# Patient Record
Sex: Male | Born: 1935 | Race: White | Hispanic: No | Marital: Married | Smoking: Former smoker
Health system: Southern US, Community
[De-identification: ages and names within clinical notes are randomized; demographics above are authoritative.]

## PROBLEM LIST (undated history)

## (undated) DIAGNOSIS — Z789 Other specified health status: Secondary | ICD-10-CM

## (undated) DIAGNOSIS — K4090 Unilateral inguinal hernia, without obstruction or gangrene, not specified as recurrent: Secondary | ICD-10-CM

## (undated) DIAGNOSIS — E559 Vitamin D deficiency, unspecified: Secondary | ICD-10-CM

## (undated) DIAGNOSIS — E785 Hyperlipidemia, unspecified: Secondary | ICD-10-CM

## (undated) DIAGNOSIS — C801 Malignant (primary) neoplasm, unspecified: Secondary | ICD-10-CM

## (undated) DIAGNOSIS — Z973 Presence of spectacles and contact lenses: Secondary | ICD-10-CM

## (undated) DIAGNOSIS — Z974 Presence of external hearing-aid: Secondary | ICD-10-CM

## (undated) DIAGNOSIS — R413 Other amnesia: Secondary | ICD-10-CM

## (undated) DIAGNOSIS — H9190 Unspecified hearing loss, unspecified ear: Secondary | ICD-10-CM

## (undated) DIAGNOSIS — K219 Gastro-esophageal reflux disease without esophagitis: Secondary | ICD-10-CM

## (undated) DIAGNOSIS — I10 Essential (primary) hypertension: Secondary | ICD-10-CM

## (undated) DIAGNOSIS — R7303 Prediabetes: Secondary | ICD-10-CM

## (undated) HISTORY — PX: TONSILLECTOMY: SUR1361

## (undated) HISTORY — DX: Other amnesia: R41.3

## (undated) HISTORY — PX: PROSTATE SURGERY: SHX751

## (undated) HISTORY — DX: Unspecified hearing loss, unspecified ear: H91.90

## (undated) HISTORY — DX: Hyperlipidemia, unspecified: E78.5

## (undated) HISTORY — DX: Gastro-esophageal reflux disease without esophagitis: K21.9

## (undated) HISTORY — DX: Prediabetes: R73.03

## (undated) HISTORY — DX: Unilateral inguinal hernia, without obstruction or gangrene, not specified as recurrent: K40.90

## (undated) HISTORY — DX: Essential (primary) hypertension: I10

## (undated) HISTORY — DX: Malignant (primary) neoplasm, unspecified: C80.1

## (undated) HISTORY — PX: COLONOSCOPY: SHX174

## (undated) HISTORY — DX: Vitamin D deficiency, unspecified: E55.9

---

## 1998-03-26 ENCOUNTER — Ambulatory Visit (HOSPITAL_COMMUNITY): Admission: RE | Admit: 1998-03-26 | Discharge: 1998-03-26 | Payer: Self-pay | Admitting: Internal Medicine

## 1998-09-21 ENCOUNTER — Emergency Department (HOSPITAL_COMMUNITY): Admission: EM | Admit: 1998-09-21 | Discharge: 1998-09-21 | Payer: Self-pay | Admitting: Emergency Medicine

## 1998-09-21 ENCOUNTER — Encounter: Payer: Self-pay | Admitting: Emergency Medicine

## 1999-03-31 ENCOUNTER — Ambulatory Visit (HOSPITAL_COMMUNITY): Admission: RE | Admit: 1999-03-31 | Discharge: 1999-03-31 | Payer: Self-pay | Admitting: Internal Medicine

## 1999-03-31 ENCOUNTER — Encounter: Payer: Self-pay | Admitting: Internal Medicine

## 1999-08-20 ENCOUNTER — Other Ambulatory Visit: Admission: RE | Admit: 1999-08-20 | Discharge: 1999-08-20 | Payer: Self-pay | Admitting: Urology

## 1999-09-16 ENCOUNTER — Encounter: Admission: RE | Admit: 1999-09-16 | Discharge: 1999-12-15 | Payer: Self-pay | Admitting: Radiation Oncology

## 1999-09-28 ENCOUNTER — Inpatient Hospital Stay (HOSPITAL_COMMUNITY): Admission: RE | Admit: 1999-09-28 | Discharge: 1999-10-01 | Payer: Self-pay | Admitting: Urology

## 1999-09-28 ENCOUNTER — Encounter (INDEPENDENT_AMBULATORY_CARE_PROVIDER_SITE_OTHER): Payer: Self-pay

## 2000-04-14 ENCOUNTER — Encounter: Payer: Self-pay | Admitting: Internal Medicine

## 2000-04-14 ENCOUNTER — Ambulatory Visit (HOSPITAL_COMMUNITY): Admission: RE | Admit: 2000-04-14 | Discharge: 2000-04-14 | Payer: Self-pay | Admitting: Internal Medicine

## 2001-04-20 ENCOUNTER — Encounter: Payer: Self-pay | Admitting: Internal Medicine

## 2001-04-20 ENCOUNTER — Ambulatory Visit (HOSPITAL_COMMUNITY): Admission: RE | Admit: 2001-04-20 | Discharge: 2001-04-20 | Payer: Self-pay | Admitting: Internal Medicine

## 2001-08-31 ENCOUNTER — Ambulatory Visit (HOSPITAL_COMMUNITY): Admission: RE | Admit: 2001-08-31 | Discharge: 2001-08-31 | Payer: Self-pay | Admitting: Gastroenterology

## 2002-05-10 ENCOUNTER — Ambulatory Visit (HOSPITAL_COMMUNITY): Admission: RE | Admit: 2002-05-10 | Discharge: 2002-05-10 | Payer: Self-pay | Admitting: Internal Medicine

## 2002-05-10 ENCOUNTER — Encounter: Payer: Self-pay | Admitting: Internal Medicine

## 2003-07-05 ENCOUNTER — Encounter: Payer: Self-pay | Admitting: Internal Medicine

## 2003-07-05 ENCOUNTER — Ambulatory Visit (HOSPITAL_COMMUNITY): Admission: RE | Admit: 2003-07-05 | Discharge: 2003-07-05 | Payer: Self-pay | Admitting: Internal Medicine

## 2004-07-09 ENCOUNTER — Ambulatory Visit (HOSPITAL_COMMUNITY): Admission: RE | Admit: 2004-07-09 | Discharge: 2004-07-09 | Payer: Self-pay | Admitting: Internal Medicine

## 2009-08-13 ENCOUNTER — Ambulatory Visit (HOSPITAL_COMMUNITY): Admission: RE | Admit: 2009-08-13 | Discharge: 2009-08-13 | Payer: Self-pay | Admitting: Internal Medicine

## 2011-05-14 NOTE — Procedures (Signed)
[place]  Patient:    Brian Proctor, Brian Proctor Visit Number: [id number] MRN: [id number]          Service Type: END Location: ENDO Attending Physician:  Orland Mustard Dictated by:   Llana Aliment. Randa Evens, M.D. Proc. Date: 08/31/01 Admit Date:  08/31/2001   CC:         Marinus Maw, M.D.   Procedure Report  PROCEDURE:  Colonoscopy.  MEDICATIONS:  Fentanyl 60 mcg, Versed 6 mg IV.  INDICATION:  Colon cancer screening.  SCOPE:  Olympus adult video colonoscope.  DESCRIPTION OF PROCEDURE:  The procedure had been explained to the patient and consent obtained.  With the patient in the left lateral decubitus position, the Olympus adult video colonoscope was inserted and advanced under direct visualization.  The prep was excellent.  We were able to reach the cecum using abdominal pressure and position change.  The ileocecal valve and appendiceal orifice were seen.  The scope was withdrawn, and the cecum, ascending colon, hepatic flexure, transverse colon, splenic flexure, descending, and sigmoid colon all seen well upon removal.  No polyps or other lesions were seen.  The scope was withdrawn.  The patient tolerated the procedure well, was maintained on low-flow oxygen and pulse oximeter throughout the procedure.  ASSESSMENT:  Normal screening colonoscopy.  PLAN:  Will recommend routine yearly Hemoccults with consideration of another colonoscopy or sigmoidoscopy in five to 10 years. Dictated by:   Llana Aliment. Randa Evens, M.D. Attending Physician:  Orland Mustard DD:  08/31/01 TD:  08/31/01 Job: [id number]

## 2012-01-10 DIAGNOSIS — M545 Low back pain: Secondary | ICD-10-CM | POA: Diagnosis not present

## 2012-02-07 DIAGNOSIS — Z79899 Other long term (current) drug therapy: Secondary | ICD-10-CM | POA: Diagnosis not present

## 2012-02-07 DIAGNOSIS — R7309 Other abnormal glucose: Secondary | ICD-10-CM | POA: Diagnosis not present

## 2012-02-07 DIAGNOSIS — E559 Vitamin D deficiency, unspecified: Secondary | ICD-10-CM | POA: Diagnosis not present

## 2012-02-07 DIAGNOSIS — Z111 Encounter for screening for respiratory tuberculosis: Secondary | ICD-10-CM | POA: Diagnosis not present

## 2012-02-07 DIAGNOSIS — I10 Essential (primary) hypertension: Secondary | ICD-10-CM | POA: Diagnosis not present

## 2012-02-07 DIAGNOSIS — C61 Malignant neoplasm of prostate: Secondary | ICD-10-CM | POA: Diagnosis not present

## 2012-02-07 DIAGNOSIS — E782 Mixed hyperlipidemia: Secondary | ICD-10-CM | POA: Diagnosis not present

## 2012-02-07 DIAGNOSIS — Z1212 Encounter for screening for malignant neoplasm of rectum: Secondary | ICD-10-CM | POA: Diagnosis not present

## 2012-05-08 DIAGNOSIS — Z79899 Other long term (current) drug therapy: Secondary | ICD-10-CM | POA: Diagnosis not present

## 2012-05-08 DIAGNOSIS — I10 Essential (primary) hypertension: Secondary | ICD-10-CM | POA: Diagnosis not present

## 2012-05-08 DIAGNOSIS — E559 Vitamin D deficiency, unspecified: Secondary | ICD-10-CM | POA: Diagnosis not present

## 2012-05-08 DIAGNOSIS — E782 Mixed hyperlipidemia: Secondary | ICD-10-CM | POA: Diagnosis not present

## 2012-05-10 DIAGNOSIS — K648 Other hemorrhoids: Secondary | ICD-10-CM | POA: Diagnosis not present

## 2012-05-10 DIAGNOSIS — K5730 Diverticulosis of large intestine without perforation or abscess without bleeding: Secondary | ICD-10-CM | POA: Diagnosis not present

## 2012-05-10 DIAGNOSIS — Z1211 Encounter for screening for malignant neoplasm of colon: Secondary | ICD-10-CM | POA: Diagnosis not present

## 2012-05-24 DIAGNOSIS — L570 Actinic keratosis: Secondary | ICD-10-CM | POA: Diagnosis not present

## 2012-05-24 DIAGNOSIS — L821 Other seborrheic keratosis: Secondary | ICD-10-CM | POA: Diagnosis not present

## 2012-08-09 DIAGNOSIS — I10 Essential (primary) hypertension: Secondary | ICD-10-CM | POA: Diagnosis not present

## 2012-08-09 DIAGNOSIS — E782 Mixed hyperlipidemia: Secondary | ICD-10-CM | POA: Diagnosis not present

## 2012-08-09 DIAGNOSIS — R7309 Other abnormal glucose: Secondary | ICD-10-CM | POA: Diagnosis not present

## 2012-08-09 DIAGNOSIS — E559 Vitamin D deficiency, unspecified: Secondary | ICD-10-CM | POA: Diagnosis not present

## 2012-08-09 DIAGNOSIS — Z79899 Other long term (current) drug therapy: Secondary | ICD-10-CM | POA: Diagnosis not present

## 2012-10-05 DIAGNOSIS — Z23 Encounter for immunization: Secondary | ICD-10-CM | POA: Diagnosis not present

## 2012-10-23 DIAGNOSIS — R1900 Intra-abdominal and pelvic swelling, mass and lump, unspecified site: Secondary | ICD-10-CM | POA: Diagnosis not present

## 2012-10-23 DIAGNOSIS — K4090 Unilateral inguinal hernia, without obstruction or gangrene, not specified as recurrent: Secondary | ICD-10-CM | POA: Diagnosis not present

## 2012-10-31 ENCOUNTER — Other Ambulatory Visit: Payer: Self-pay | Admitting: Internal Medicine

## 2012-10-31 DIAGNOSIS — R1031 Right lower quadrant pain: Secondary | ICD-10-CM

## 2012-11-01 ENCOUNTER — Ambulatory Visit
Admission: RE | Admit: 2012-11-01 | Discharge: 2012-11-01 | Disposition: A | Discharge status: Home / Self Care | Payer: Medicare Other | Source: Ambulatory Visit | Attending: Internal Medicine | Admitting: Internal Medicine

## 2012-11-01 DIAGNOSIS — R1031 Right lower quadrant pain: Secondary | ICD-10-CM

## 2012-11-01 DIAGNOSIS — R109 Unspecified abdominal pain: Secondary | ICD-10-CM | POA: Diagnosis not present

## 2012-11-27 DIAGNOSIS — L570 Actinic keratosis: Secondary | ICD-10-CM | POA: Diagnosis not present

## 2012-11-27 DIAGNOSIS — L821 Other seborrheic keratosis: Secondary | ICD-10-CM | POA: Diagnosis not present

## 2012-11-27 DIAGNOSIS — C44319 Basal cell carcinoma of skin of other parts of face: Secondary | ICD-10-CM | POA: Diagnosis not present

## 2012-11-27 DIAGNOSIS — D485 Neoplasm of uncertain behavior of skin: Secondary | ICD-10-CM | POA: Diagnosis not present

## 2012-12-05 DIAGNOSIS — H2510 Age-related nuclear cataract, unspecified eye: Secondary | ICD-10-CM | POA: Diagnosis not present

## 2012-12-12 ENCOUNTER — Encounter (INDEPENDENT_AMBULATORY_CARE_PROVIDER_SITE_OTHER): Payer: Self-pay | Admitting: General Surgery

## 2012-12-12 ENCOUNTER — Ambulatory Visit (INDEPENDENT_AMBULATORY_CARE_PROVIDER_SITE_OTHER): Payer: Medicare Other | Admitting: General Surgery

## 2012-12-12 VITALS — BP 128/70 | HR 72 | Temp 97.6°F | Resp 18 | Ht 70.0 in | Wt 158.0 lb

## 2012-12-12 DIAGNOSIS — K4090 Unilateral inguinal hernia, without obstruction or gangrene, not specified as recurrent: Secondary | ICD-10-CM

## 2012-12-12 NOTE — Progress Notes (Signed)
Patient ID: Brian Proctor, male   DOB: 16-Oct-1936, 76 y.o.   MRN: [id number]  Chief Complaint  Patient presents with  . Inguinal Hernia    HPI Brian Proctor is a 76 y.o. male.  RIH HPI   Has had symptomatic RIH for over one month, ultrasound did not demonstrate hernia, but clinically very obvious.  Not incarcerated. Past Medical History  Diagnosis Date  . Hyperlipidemia   . Inguinal hernia   . Hearing loss     wear hearing aid  . Cancer     prostate - basal cell    Past Surgical History  Procedure Date  . Tonsillectomy     as a child  . Prostate surgery 1999 or 2000    due to cancer    History reviewed. No pertinent family history.  Social History History  Substance Use Topics  . Smoking status: Never Smoker   . Smokeless tobacco: Never Used  . Alcohol Use: Not on file     Comment: occasional    No Known Allergies  Current Outpatient Prescriptions  Medication Sig Dispense Refill  . Ascorbic Acid (VITAMIN C PO) Take by mouth daily.      [name] aspirin EC 81 MG tablet Take 81 mg by mouth daily.      [name] atorvastatin (LIPITOR) 40 MG tablet Daily.      . Cholecalciferol (VITAMIN D PO) Take by mouth daily.      . fish oil-omega-3 fatty acids 1000 MG capsule Take 2 g by mouth daily.      . Multiple Vitamins-Minerals (MULTIVITAMIN PO) Take by mouth daily.      . fluorouracil (EFUDEX) 5 % cream       . PROCTOSOL HC 2.5 % rectal cream Place rectally 2 (two) times daily.         Review of Systems Review of Systems  Constitutional: Negative.   HENT: Negative.   Eyes: Negative.   Respiratory: Negative.   Cardiovascular: Negative.   Gastrointestinal: Negative.   Musculoskeletal: Negative.   Skin: Positive for wound (hx of skin cancer/basal cell Ca).  Hematological: Negative.   Psychiatric/Behavioral: Negative.     Blood pressure 128/70, pulse 72, temperature 97.6 F (36.4 C), temperature source Temporal, resp. rate 18, height 5\' 10"  (1.778 m), weight 158 lb  (71.668 kg).  Physical Exam Physical Exam  Constitutional: He is oriented to person, place, and time. He appears well-developed and well-nourished.  HENT:  Head: Normocephalic and atraumatic.  Eyes: Conjunctivae normal and EOM are normal. Pupils are equal, round, and reactive to light.  Neck: Normal range of motion. Neck supple.  Cardiovascular: Normal rate, regular rhythm and normal heart sounds.   Pulmonary/Chest: Effort normal and breath sounds normal.  Abdominal: Soft. Bowel sounds are normal. A hernia is present. Hernia confirmed positive in the right inguinal area.    Genitourinary: Penis normal.  Musculoskeletal: Normal range of motion.  Neurological: He is alert and oriented to person, place, and time. He has normal reflexes.  Skin: Skin is warm and dry.  Psychiatric: He has a normal mood and affect. His behavior is normal. Judgment and thought content normal.    Data Reviewed Ultrasound, previous office visits  Assessment    RIH, reducible    Plan    RIH open repair with mesh       Milaina Sher O 12/12/2012, 9:41 AM

## 2012-12-28 DIAGNOSIS — C4401 Basal cell carcinoma of skin of lip: Secondary | ICD-10-CM | POA: Diagnosis not present

## 2013-01-10 ENCOUNTER — Other Ambulatory Visit: Payer: Self-pay

## 2013-01-10 ENCOUNTER — Encounter (HOSPITAL_BASED_OUTPATIENT_CLINIC_OR_DEPARTMENT_OTHER)
Admission: RE | Admit: 2013-01-10 | Discharge: 2013-01-10 | Disposition: A | Discharge status: Home / Self Care | Payer: Medicare Other | Source: Ambulatory Visit

## 2013-01-10 ENCOUNTER — Encounter (HOSPITAL_BASED_OUTPATIENT_CLINIC_OR_DEPARTMENT_OTHER): Payer: Self-pay | Admitting: *Deleted

## 2013-01-10 ENCOUNTER — Ambulatory Visit
Admission: RE | Admit: 2013-01-10 | Discharge: 2013-01-10 | Disposition: A | Discharge status: Home / Self Care | Payer: Medicare Other | Source: Ambulatory Visit | Attending: General Surgery | Admitting: General Surgery

## 2013-01-10 DIAGNOSIS — Z01818 Encounter for other preprocedural examination: Secondary | ICD-10-CM | POA: Diagnosis not present

## 2013-01-10 DIAGNOSIS — Z8546 Personal history of malignant neoplasm of prostate: Secondary | ICD-10-CM | POA: Diagnosis not present

## 2013-01-10 DIAGNOSIS — E785 Hyperlipidemia, unspecified: Secondary | ICD-10-CM | POA: Diagnosis not present

## 2013-01-10 DIAGNOSIS — Z01812 Encounter for preprocedural laboratory examination: Secondary | ICD-10-CM | POA: Diagnosis not present

## 2013-01-10 DIAGNOSIS — Z87891 Personal history of nicotine dependence: Secondary | ICD-10-CM | POA: Diagnosis not present

## 2013-01-10 DIAGNOSIS — K4090 Unilateral inguinal hernia, without obstruction or gangrene, not specified as recurrent: Secondary | ICD-10-CM | POA: Diagnosis not present

## 2013-01-10 DIAGNOSIS — Z79899 Other long term (current) drug therapy: Secondary | ICD-10-CM | POA: Diagnosis not present

## 2013-01-10 DIAGNOSIS — Z7982 Long term (current) use of aspirin: Secondary | ICD-10-CM | POA: Diagnosis not present

## 2013-01-10 LAB — CBC WITH DIFFERENTIAL/PLATELET
Basophils Absolute: 0 10*3/uL (ref 0.0–0.1)
Eosinophils Absolute: 0.3 10*3/uL (ref 0.0–0.7)
Eosinophils Relative: 4 % (ref 0–5)
Lymphs Abs: 2.2 10*3/uL (ref 0.7–4.0)
MCH: 30.4 pg (ref 26.0–34.0)
MCV: 92 fL (ref 78.0–100.0)
Platelets: 247 10*3/uL (ref 150–400)
RDW: 13.3 % (ref 11.5–15.5)

## 2013-01-10 LAB — BASIC METABOLIC PANEL
BUN: 18 mg/dL (ref 6–23)
CO2: 27 mEq/L (ref 19–32)
Calcium: 9.7 mg/dL (ref 8.4–10.5)
Creatinine, Ser: 0.93 mg/dL (ref 0.50–1.35)
Glucose, Bld: 86 mg/dL (ref 70–99)

## 2013-01-10 NOTE — Progress Notes (Signed)
To come in for CCS labs,cxr and ekg No heart or resp problems

## 2013-01-15 ENCOUNTER — Ambulatory Visit (HOSPITAL_BASED_OUTPATIENT_CLINIC_OR_DEPARTMENT_OTHER): Payer: Medicare Other | Admitting: Anesthesiology

## 2013-01-15 ENCOUNTER — Encounter (HOSPITAL_BASED_OUTPATIENT_CLINIC_OR_DEPARTMENT_OTHER): Payer: Self-pay

## 2013-01-15 ENCOUNTER — Encounter (HOSPITAL_BASED_OUTPATIENT_CLINIC_OR_DEPARTMENT_OTHER): Payer: Self-pay | Admitting: Anesthesiology

## 2013-01-15 ENCOUNTER — Ambulatory Visit (HOSPITAL_BASED_OUTPATIENT_CLINIC_OR_DEPARTMENT_OTHER)
Admission: RE | Admit: 2013-01-15 | Discharge: 2013-01-15 | Disposition: A | Discharge status: Home / Self Care | Payer: Medicare Other | Source: Ambulatory Visit | Attending: General Surgery | Admitting: General Surgery

## 2013-01-15 ENCOUNTER — Encounter (HOSPITAL_BASED_OUTPATIENT_CLINIC_OR_DEPARTMENT_OTHER)
Admission: RE | Disposition: A | Discharge status: Home / Self Care | Payer: Self-pay | Source: Ambulatory Visit | Attending: General Surgery

## 2013-01-15 DIAGNOSIS — K4090 Unilateral inguinal hernia, without obstruction or gangrene, not specified as recurrent: Secondary | ICD-10-CM

## 2013-01-15 DIAGNOSIS — Z01812 Encounter for preprocedural laboratory examination: Secondary | ICD-10-CM | POA: Insufficient documentation

## 2013-01-15 DIAGNOSIS — E785 Hyperlipidemia, unspecified: Secondary | ICD-10-CM | POA: Diagnosis not present

## 2013-01-15 DIAGNOSIS — G8918 Other acute postprocedural pain: Secondary | ICD-10-CM | POA: Diagnosis not present

## 2013-01-15 DIAGNOSIS — Z79899 Other long term (current) drug therapy: Secondary | ICD-10-CM | POA: Diagnosis not present

## 2013-01-15 DIAGNOSIS — Z8546 Personal history of malignant neoplasm of prostate: Secondary | ICD-10-CM | POA: Diagnosis not present

## 2013-01-15 DIAGNOSIS — Z7982 Long term (current) use of aspirin: Secondary | ICD-10-CM | POA: Insufficient documentation

## 2013-01-15 DIAGNOSIS — R1084 Generalized abdominal pain: Secondary | ICD-10-CM | POA: Diagnosis not present

## 2013-01-15 HISTORY — DX: Presence of external hearing-aid: Z97.4

## 2013-01-15 HISTORY — PX: INGUINAL HERNIA REPAIR: SHX194

## 2013-01-15 HISTORY — DX: Other specified health status: Z78.9

## 2013-01-15 HISTORY — DX: Presence of spectacles and contact lenses: Z97.3

## 2013-01-15 SURGERY — REPAIR, HERNIA, INGUINAL, ADULT
Anesthesia: General | Site: Groin | Laterality: Right | Wound class: Clean

## 2013-01-15 MED ORDER — OXYCODONE HCL 5 MG PO TABS: 5.0000 mg | ORAL_TABLET | Freq: Once | ORAL | Status: DC | PRN

## 2013-01-15 MED ORDER — PROMETHAZINE HCL 25 MG/ML IJ SOLN: 6.2500 mg | INTRAMUSCULAR | Status: DC | PRN

## 2013-01-15 MED ORDER — PROPOFOL 10 MG/ML IV BOLUS
INTRAVENOUS | Status: DC | PRN
  Administered 2013-01-15: 150 mg via INTRAVENOUS

## 2013-01-15 MED ORDER — MIDAZOLAM HCL 2 MG/2ML IJ SOLN
1.0000 mg | INTRAMUSCULAR | Status: DC | PRN
  Administered 2013-01-15: 2 mg via INTRAVENOUS

## 2013-01-15 MED ORDER — LIDOCAINE HCL (CARDIAC) 20 MG/ML IV SOLN
INTRAVENOUS | Status: DC | PRN
  Administered 2013-01-15: 50 mg via INTRAVENOUS

## 2013-01-15 MED ORDER — BUPIVACAINE-EPINEPHRINE PF 0.5-1:200000 % IJ SOLN
INTRAMUSCULAR | Status: DC | PRN
  Administered 2013-01-15: 25 mL

## 2013-01-15 MED ORDER — BUPIVACAINE-EPINEPHRINE PF 0.25-1:200000 % IJ SOLN
INTRAMUSCULAR | Status: DC | PRN
  Administered 2013-01-15: 20 mL

## 2013-01-15 MED ORDER — CEFAZOLIN SODIUM-DEXTROSE 2-3 GM-% IV SOLR
2.0000 g | INTRAVENOUS | Status: AC
  Administered 2013-01-15: 2 g via INTRAVENOUS

## 2013-01-15 MED ORDER — FENTANYL CITRATE 0.05 MG/ML IJ SOLN
50.0000 ug | Freq: Once | INTRAMUSCULAR | Status: AC
  Administered 2013-01-15: 50 ug via INTRAVENOUS

## 2013-01-15 MED ORDER — FENTANYL CITRATE 0.05 MG/ML IJ SOLN
INTRAMUSCULAR | Status: DC | PRN
  Administered 2013-01-15: 25 ug via INTRAVENOUS
  Administered 2013-01-15: 50 ug via INTRAVENOUS

## 2013-01-15 MED ORDER — CHLORHEXIDINE GLUCONATE 4 % EX LIQD: 1.0000 "application " | Freq: Once | CUTANEOUS | Status: DC

## 2013-01-15 MED ORDER — SODIUM CHLORIDE 0.9 % IR SOLN
Status: DC | PRN
  Administered 2013-01-15: 13:00:00

## 2013-01-15 MED ORDER — LACTATED RINGERS IV SOLN
INTRAVENOUS | Status: DC
  Administered 2013-01-15 (×2): via INTRAVENOUS

## 2013-01-15 MED ORDER — DEXAMETHASONE SODIUM PHOSPHATE 4 MG/ML IJ SOLN
INTRAMUSCULAR | Status: DC | PRN
  Administered 2013-01-15: 8 mg via INTRAVENOUS
  Administered 2013-01-15: 4 mg

## 2013-01-15 MED ORDER — HYDROMORPHONE HCL PF 1 MG/ML IJ SOLN
0.2500 mg | INTRAMUSCULAR | Status: DC | PRN
  Administered 2013-01-15: 0.5 mg via INTRAVENOUS

## 2013-01-15 MED ORDER — ACETAMINOPHEN 10 MG/ML IV SOLN
INTRAVENOUS | Status: DC | PRN
  Administered 2013-01-15: 1000 mg via INTRAVENOUS

## 2013-01-15 MED ORDER — HYDROCODONE-ACETAMINOPHEN 5-500 MG PO TABS: 1.0000 | ORAL_TABLET | Freq: Four times a day (QID) | ORAL | Status: DC | PRN

## 2013-01-15 MED ORDER — MIDAZOLAM HCL 5 MG/5ML IJ SOLN
INTRAMUSCULAR | Status: DC | PRN
  Administered 2013-01-15: 1 mg via INTRAVENOUS

## 2013-01-15 MED ORDER — OXYCODONE HCL 5 MG/5ML PO SOLN: 5.0000 mg | Freq: Once | ORAL | Status: DC | PRN

## 2013-01-15 SURGICAL SUPPLY — 51 items
ADH SKN CLS APL DERMABOND .7 (GAUZE/BANDAGES/DRESSINGS) ×2
BAG DECANTER FOR FLEXI CONT (MISCELLANEOUS) ×3 IMPLANT
BLADE SURG 10 STRL SS (BLADE) ×3 IMPLANT
BLADE SURG 15 STRL LF DISP TIS (BLADE) ×4 IMPLANT
BLADE SURG 15 STRL SS (BLADE) ×6
BLADE SURG ROTATE 9660 (MISCELLANEOUS) ×3 IMPLANT
CANISTER SUCTION 1200CC (MISCELLANEOUS) IMPLANT
CHLORAPREP W/TINT 26ML (MISCELLANEOUS) ×3 IMPLANT
CLEANER CAUTERY TIP 5X5 PAD (MISCELLANEOUS) ×2 IMPLANT
CLOTH BEACON ORANGE TIMEOUT ST (SAFETY) ×3 IMPLANT
COVER MAYO STAND STRL (DRAPES) ×3 IMPLANT
COVER TABLE BACK 60X90 (DRAPES) ×3 IMPLANT
DECANTER SPIKE VIAL GLASS SM (MISCELLANEOUS) IMPLANT
DERMABOND ADVANCED (GAUZE/BANDAGES/DRESSINGS) ×1
DERMABOND ADVANCED .7 DNX12 (GAUZE/BANDAGES/DRESSINGS) ×2 IMPLANT
DRAIN PENROSE 1/2X12 LTX STRL (WOUND CARE) ×3 IMPLANT
DRAPE LAPAROTOMY TRNSV 102X78 (DRAPE) ×3 IMPLANT
DRAPE UTILITY XL STRL (DRAPES) ×3 IMPLANT
DRSG TEGADERM 2-3/8X2-3/4 SM (GAUZE/BANDAGES/DRESSINGS) IMPLANT
DRSG TEGADERM 4X4.75 (GAUZE/BANDAGES/DRESSINGS) ×3 IMPLANT
ELECT REM PT RETURN 9FT ADLT (ELECTROSURGICAL) ×3
ELECTRODE REM PT RTRN 9FT ADLT (ELECTROSURGICAL) ×2 IMPLANT
GLOVE BIOGEL PI IND STRL 8 (GLOVE) ×2 IMPLANT
GLOVE BIOGEL PI INDICATOR 8 (GLOVE) ×1
GLOVE ECLIPSE 6.5 STRL STRAW (GLOVE) ×3 IMPLANT
GLOVE ECLIPSE 7.5 STRL STRAW (GLOVE) ×3 IMPLANT
GOWN PREVENTION PLUS XLARGE (GOWN DISPOSABLE) ×3 IMPLANT
MESH HERNIA 3X6 (Mesh General) ×3 IMPLANT
NEEDLE HYPO 25X1 1.5 SAFETY (NEEDLE) ×3 IMPLANT
NS IRRIG 1000ML POUR BTL (IV SOLUTION) IMPLANT
PACK BASIN DAY SURGERY FS (CUSTOM PROCEDURE TRAY) ×3 IMPLANT
PAD CLEANER CAUTERY TIP 5X5 (MISCELLANEOUS) ×1
PENCIL BUTTON HOLSTER BLD 10FT (ELECTRODE) ×3 IMPLANT
SLEEVE SCD COMPRESS KNEE MED (MISCELLANEOUS) IMPLANT
SPONGE INTESTINAL PEANUT (DISPOSABLE) ×3 IMPLANT
SPONGE LAP 4X18 X RAY DECT (DISPOSABLE) ×3 IMPLANT
STRIP CLOSURE SKIN 1/2X4 (GAUZE/BANDAGES/DRESSINGS) ×3 IMPLANT
SUT ETHIBOND 0 MO6 C/R (SUTURE) ×3 IMPLANT
SUT MON AB 4-0 PC3 18 (SUTURE) ×3 IMPLANT
SUT PROLENE 0 CT 2 (SUTURE) ×6 IMPLANT
SUT VIC AB 3-0 SH 27 (SUTURE) ×6
SUT VIC AB 3-0 SH 27X BRD (SUTURE) ×4 IMPLANT
SUT VICRYL 4-0 PS2 18IN ABS (SUTURE) IMPLANT
SUT VICRYL AB 3 0 TIES (SUTURE) ×3 IMPLANT
SYR BULB 3OZ (MISCELLANEOUS) ×3 IMPLANT
SYR CONTROL 10ML LL (SYRINGE) ×3 IMPLANT
TOWEL OR 17X24 6PK STRL BLUE (TOWEL DISPOSABLE) ×3 IMPLANT
TOWEL OR NON WOVEN STRL DISP B (DISPOSABLE) IMPLANT
TUBE CONNECTING 20X1/4 (TUBING) IMPLANT
WATER STERILE IRR 1000ML POUR (IV SOLUTION) IMPLANT
YANKAUER SUCT BULB TIP NO VENT (SUCTIONS) IMPLANT

## 2013-01-15 NOTE — Anesthesia Preprocedure Evaluation (Signed)
Anesthesia Evaluation  Patient identified by MRN, date of birth, ID band Patient awake    Reviewed: Allergy & Precautions, H&P , NPO status , Patient's Chart, lab work & pertinent test results  Airway Mallampati: I TM Distance: >3 FB Neck ROM: Full    Dental   Pulmonary former smoker,  breath sounds clear to auscultation        Cardiovascular Rhythm:Regular Rate:Normal     Neuro/Psych    GI/Hepatic   Endo/Other    Renal/GU      Musculoskeletal   Abdominal   Peds  Hematology   Anesthesia Other Findings   Reproductive/Obstetrics                           Anesthesia Physical Anesthesia Plan  ASA: II  Anesthesia Plan: General   Post-op Pain Management:    Induction: Intravenous  Airway Management Planned: LMA  Additional Equipment:   Intra-op Plan:   Post-operative Plan: Extubation in OR  Informed Consent: I have reviewed the patients History and Physical, chart, labs and discussed the procedure including the risks, benefits and alternatives for the proposed anesthesia with the patient or authorized representative who has indicated his/her understanding and acceptance.     Plan Discussed with: CRNA and Surgeon  Anesthesia Plan Comments:         Anesthesia Quick Evaluation

## 2013-01-15 NOTE — Interval H&P Note (Signed)
History and Physical Interval Note:  01/15/2013 11:33 AM  Brian Proctor  has presented today for surgery, with the diagnosis of right inguinal hernia  The various methods of treatment have been discussed with the patient and family. After consideration of risks, benefits and other options for treatment, the patient has consented to  Procedure(s) (LRB) with comments: HERNIA REPAIR INGUINAL ADULT (Right) as a surgical intervention .  The patient's history has been reviewed, patient examined, no change in status, stable for surgery.  I have reviewed the patient's chart and labs.  Questions were answered to the patient's satisfaction.     Cathyrn Deas O  No new problems.  Patient marked on the right side for surgical repair.

## 2013-01-15 NOTE — Anesthesia Procedure Notes (Addendum)
Anesthesia Regional Block:  TAP block  Pre-Anesthetic Checklist: ,, timeout performed, Correct Patient, Correct Site, Correct Laterality, Correct Procedure, Correct Position, site marked, Risks and benefits discussed,  Surgical consent,  Pre-op evaluation,  At surgeon's request and post-op pain management  Laterality: Right  Prep: chloraprep       Needles:  Injection technique: Single-shot  Needle Type: Echogenic Stimulator Needle      Needle Gauge: 22 and 22 G    Additional Needles: TAP block Narrative:  Start time: 01/15/2013 9:50 AM End time: 01/15/2013 10:00 AM Injection made incrementally with aspirations every 3 mL. Anesthesiologist: Dr Gypsy Balsam  Additional Notes: 0950-10000 R TAP Block POP CHG prep, sterile tech #22 echo/stim needle with good [place] visualization and pix in chart Mid axil line approach Multiple neg asp Vernia Buff .5% w/epi 1:200000 total 25cc+decadron 4mg  infiltrated No compl Dr Gypsy Balsam    TAP block Procedure Name: LMA Insertion Date/Time: 01/15/2013 11:42 AM Performed by: Zenia Resides D Pre-anesthesia Checklist: Patient identified, Emergency Drugs available, Suction available and Patient being monitored Patient Re-evaluated:Patient Re-evaluated prior to inductionOxygen Delivery Method: Circle System Utilized Preoxygenation: Pre-oxygenation with 100% oxygen Intubation Type: IV induction Ventilation: Mask ventilation without difficulty LMA: LMA inserted LMA Size: 4.0 Number of attempts: 1 Airway Equipment and Method: bite block Placement Confirmation: positive ETCO2 Tube secured with: Tape Dental Injury: Teeth and Oropharynx as per pre-operative assessment

## 2013-01-15 NOTE — H&P (Signed)
Progress Notes Info       Chartered loss adjuster  Note Status  Last Update User  Last Update Date/Time    Cherylynn Ridges, MD  Signed  Cherylynn Ridges, MD  12/12/2012 9:48 AM          Progress Notes     Patient ID: Brian Proctor, male DOB: 1936/07/26, 77 y.o. MRN: [id number]     Chief Complaint     Patient presents with     .  Inguinal Hernia     HPI  Brian Proctor is a 77 y.o. male. RIH  HPI  Has had symptomatic RIH for over one month, ultrasound did not demonstrate hernia, but clinically very obvious. Not incarcerated.     Past Medical History     Diagnosis  Date     .  Hyperlipidemia      .  Inguinal hernia      .  Hearing loss        wear hearing aid     .  Cancer        prostate - basal cell     Past Surgical History     Procedure  Date     .  Tonsillectomy        as a child     .  Prostate surgery  1999 or 2000       due to cancer     History reviewed. No pertinent family history.  Social History     History     Substance Use Topics     .  Smoking status:  Never Smoker     .  Smokeless tobacco:  Never Used     .  Alcohol Use:  Not on file        Comment: occasional     No Known Allergies     Current Outpatient Prescriptions     Medication  Sig  Dispense  Refill     .  Ascorbic Acid (VITAMIN C PO)  Take by mouth daily.       [name]  aspirin EC 81 MG tablet  Take 81 mg by mouth daily.       [name]  atorvastatin (LIPITOR) 40 MG tablet  Daily.       .  Cholecalciferol (VITAMIN D PO)  Take by mouth daily.       .  fish oil-omega-3 fatty acids 1000 MG capsule  Take 2 g by mouth daily.       .  Multiple Vitamins-Minerals (MULTIVITAMIN PO)  Take by mouth daily.       .  fluorouracil (EFUDEX) 5 % cream        .  PROCTOSOL HC 2.5 % rectal cream  Place rectally 2 (two) times daily.       Review of Systems  Review of Systems  Constitutional: Negative.  HENT: Negative.  Eyes: Negative.  Respiratory: Negative.  Cardiovascular: Negative.  Gastrointestinal: Negative.  Musculoskeletal:  Negative.  Skin: Positive for wound (hx of skin cancer/basal cell Ca).  Hematological: Negative.  Psychiatric/Behavioral: Negative.  Blood pressure 128/70, pulse 72, temperature 97.6 F (36.4 C), temperature source Temporal, resp. rate 18, height 5\' 10"  (1.778 m), weight 158 lb (71.668 kg).  Physical Exam  Physical Exam  Constitutional: He is oriented to person, place, and time. He appears well-developed and well-nourished.  HENT:  Head: Normocephalic and atraumatic.  Eyes: Conjunctivae normal and EOM are normal. Pupils are equal, round, and reactive to  light.  Neck: Normal range of motion. Neck supple.  Cardiovascular: Normal rate, regular rhythm and normal heart sounds.  Pulmonary/Chest: Effort normal and breath sounds normal.  Abdominal: Soft. Bowel sounds are normal. A hernia is present. Hernia confirmed positive in the right inguinal area.    Genitourinary: Penis normal.  Musculoskeletal: Normal range of motion.  Neurological: He is alert and oriented to person, place, and time. He has normal reflexes.  Skin: Skin is warm and dry.  Psychiatric: He has a normal mood and affect. His behavior is normal. Judgment and thought content normal.  Data Reviewed  Ultrasound, previous office visits  Assessment  RIH, reducible  Plan  RIH open repair with mesh

## 2013-01-15 NOTE — Anesthesia Postprocedure Evaluation (Signed)
  Anesthesia Post-op Note  Patient: Brian Proctor  Procedure(s) Performed: Procedure(s) (LRB) with comments: HERNIA REPAIR INGUINAL ADULT (Right)  Patient Location: PACU  Anesthesia Type:GA combined with regional for post-op pain  Level of Consciousness: awake and alert   Airway and Oxygen Therapy: Patient Spontanous Breathing  Post-op Pain: mild  Post-op Assessment: Post-op Vital signs reviewed, Patient's Cardiovascular Status Stable, Respiratory Function Stable, Patent Airway, No signs of Nausea or vomiting, Adequate PO intake and Pain level controlled  Post-op Vital Signs: stable  Complications: No apparent anesthesia complications

## 2013-01-15 NOTE — Progress Notes (Signed)
Assisted Dr. Kasik with right, ultrasound guided, transabdominal plane block. Side rails up, monitors on throughout procedure. See vital signs in flow sheet. Tolerated Procedure well. 

## 2013-01-15 NOTE — Op Note (Signed)
OPERATIVE REPORT  DATE OF OPERATION: 01/15/2013  PATIENT:  Brian Proctor  77 y.o. male  PRE-OPERATIVE DIAGNOSIS:  right inguinal hernia  POST-OPERATIVE DIAGNOSIS:  right inguinal hernia  PROCEDURE:  Procedure(s): HERNIA REPAIR INGUINAL ADULT  SURGEON:  Surgeon(s): Cherylynn Ridges, MD  ASSISTANT: None  ANESTHESIA:   general  EBL: <30 ml  BLOOD ADMINISTERED: none  DRAINS: none   SPECIMEN:  No Specimen  COUNTS CORRECT:  YES  PROCEDURE DETAILS: The patient was taken to the operating room and placed on the table in the supine position. After an adequate general laryngeal airway anesthetic was administered he was prepped and draped in usual sterile manner exposing his right groin.  After proper time out was performed identifying the patient and the procedure to be performed a right lower quadrant transverse curvilinear incision was made using a #10 blade and taken down to the external oblique fascia. We incised the fascia through the superficial ring. We mobilized and dissected out the spermatic cord along with the hernia sac at the pubic tubercle and mobilized it with a Penrose drain.  We subsequently dissected away the hernia sac away from the cord. We ligated the hernia sac at the base using 2 suture ligatures of 0 Ethibond suture. Once that was done we repaired the floor of the hernia.  We repaired the floor using an oval piece of polypropylene mesh measuring approximately 5 x 3 cm in size attaching it to the pubic tubercle and the conjoined tendon and the reflected portion of the inguinal ligament. This was done using a running 0 Prolene suture. Antibiotic solution was used to soak the mesh. We irrigated with antibiotic solution.  Once this was done we reapproximated the external oblique fascia on top the cord using a running 3-0 Vicryl suture. Scarpa's fascia was reapproximated on top of the floor using interrupted 3-0 Vicryl suture the skin was closed using running subcuticular  stitch of 4-0 Monocryl. Marcaine was used to provide postoperative analgesia. Dermabond Steri-Strips and Tegaderms use complete the dressings. All counts were correct  PATIENT DISPOSITION:  PACU - hemodynamically stable.   Cherylynn Ridges 1/20/201412:54 PM

## 2013-01-15 NOTE — Transfer of Care (Signed)
Immediate Anesthesia Transfer of Care Note  Patient: Brian Proctor  Procedure(s) Performed: Procedure(s) (LRB) with comments: HERNIA REPAIR INGUINAL ADULT (Right)  Patient Location: PACU  Anesthesia Type:General and Regional  Level of Consciousness: awake  Airway & Oxygen Therapy: Patient Spontanous Breathing and Patient connected to face mask oxygen  Post-op Assessment: Report given to PACU RN and Post -op Vital signs reviewed and stable  Post vital signs: Reviewed and stable  Complications: No apparent anesthesia complications

## 2013-01-17 ENCOUNTER — Telehealth (INDEPENDENT_AMBULATORY_CARE_PROVIDER_SITE_OTHER): Payer: Self-pay | Admitting: General Surgery

## 2013-01-17 ENCOUNTER — Encounter (HOSPITAL_BASED_OUTPATIENT_CLINIC_OR_DEPARTMENT_OTHER): Payer: Self-pay | Admitting: General Surgery

## 2013-01-17 NOTE — Telephone Encounter (Signed)
Pt called to ask about discolored and swollen scrotum.  Reassured pt and let him know this is normal sequelae for open IHR.  Suggested that when he sits, he should elevated his feet, the further elevate his scrotum to facilitate drainage of excessive fluids.  Also discussed prevention of constipation while immobile, but walking, increasing po fluids, using stool softeners and Miralax.

## 2013-01-22 DIAGNOSIS — L570 Actinic keratosis: Secondary | ICD-10-CM | POA: Diagnosis not present

## 2013-01-24 ENCOUNTER — Encounter (INDEPENDENT_AMBULATORY_CARE_PROVIDER_SITE_OTHER): Payer: Self-pay

## 2013-01-30 ENCOUNTER — Ambulatory Visit (INDEPENDENT_AMBULATORY_CARE_PROVIDER_SITE_OTHER): Payer: Medicare Other | Admitting: General Surgery

## 2013-01-30 ENCOUNTER — Encounter (INDEPENDENT_AMBULATORY_CARE_PROVIDER_SITE_OTHER): Payer: Self-pay | Admitting: General Surgery

## 2013-01-30 VITALS — BP 138/80 | HR 60 | Temp 97.1°F | Resp 16 | Ht 70.0 in | Wt 160.6 lb

## 2013-01-30 DIAGNOSIS — Z09 Encounter for follow-up examination after completed treatment for conditions other than malignant neoplasm: Secondary | ICD-10-CM

## 2013-01-30 NOTE — Progress Notes (Signed)
The patient is doing well status post open radical hernia repair. A remove the dressing in the incision looks well with no evidence of infection. He has no evidence for recurrent hernia.  The patient is a golfer and can go back to full swings in 4 weeks. Currently he is only to do short game workouts with no full swings.  He is to return to see me on an as-needed basis.

## 2013-02-07 DIAGNOSIS — E782 Mixed hyperlipidemia: Secondary | ICD-10-CM | POA: Diagnosis not present

## 2013-02-07 DIAGNOSIS — C61 Malignant neoplasm of prostate: Secondary | ICD-10-CM | POA: Diagnosis not present

## 2013-02-07 DIAGNOSIS — I10 Essential (primary) hypertension: Secondary | ICD-10-CM | POA: Diagnosis not present

## 2013-02-07 DIAGNOSIS — Z79899 Other long term (current) drug therapy: Secondary | ICD-10-CM | POA: Diagnosis not present

## 2013-02-07 DIAGNOSIS — Z1212 Encounter for screening for malignant neoplasm of rectum: Secondary | ICD-10-CM | POA: Diagnosis not present

## 2013-02-07 DIAGNOSIS — R7309 Other abnormal glucose: Secondary | ICD-10-CM | POA: Diagnosis not present

## 2013-02-07 DIAGNOSIS — E559 Vitamin D deficiency, unspecified: Secondary | ICD-10-CM | POA: Diagnosis not present

## 2013-02-10 ENCOUNTER — Other Ambulatory Visit: Payer: Self-pay

## 2013-02-14 DIAGNOSIS — L570 Actinic keratosis: Secondary | ICD-10-CM | POA: Diagnosis not present

## 2013-05-09 DIAGNOSIS — Z79899 Other long term (current) drug therapy: Secondary | ICD-10-CM | POA: Diagnosis not present

## 2013-05-09 DIAGNOSIS — R7309 Other abnormal glucose: Secondary | ICD-10-CM | POA: Diagnosis not present

## 2013-05-09 DIAGNOSIS — I10 Essential (primary) hypertension: Secondary | ICD-10-CM | POA: Diagnosis not present

## 2013-05-09 DIAGNOSIS — E559 Vitamin D deficiency, unspecified: Secondary | ICD-10-CM | POA: Diagnosis not present

## 2013-05-09 DIAGNOSIS — E782 Mixed hyperlipidemia: Secondary | ICD-10-CM | POA: Diagnosis not present

## 2013-08-01 ENCOUNTER — Other Ambulatory Visit: Payer: Self-pay

## 2013-08-15 DIAGNOSIS — L821 Other seborrheic keratosis: Secondary | ICD-10-CM | POA: Diagnosis not present

## 2013-08-15 DIAGNOSIS — L570 Actinic keratosis: Secondary | ICD-10-CM | POA: Diagnosis not present

## 2013-08-15 DIAGNOSIS — Z85828 Personal history of other malignant neoplasm of skin: Secondary | ICD-10-CM | POA: Diagnosis not present

## 2013-08-15 DIAGNOSIS — D485 Neoplasm of uncertain behavior of skin: Secondary | ICD-10-CM | POA: Diagnosis not present

## 2013-10-03 DIAGNOSIS — Z23 Encounter for immunization: Secondary | ICD-10-CM | POA: Diagnosis not present

## 2013-10-22 DIAGNOSIS — L570 Actinic keratosis: Secondary | ICD-10-CM | POA: Diagnosis not present

## 2013-10-22 DIAGNOSIS — Z85828 Personal history of other malignant neoplasm of skin: Secondary | ICD-10-CM | POA: Diagnosis not present

## 2013-10-22 DIAGNOSIS — C44621 Squamous cell carcinoma of skin of unspecified upper limb, including shoulder: Secondary | ICD-10-CM | POA: Diagnosis not present

## 2013-10-22 DIAGNOSIS — D485 Neoplasm of uncertain behavior of skin: Secondary | ICD-10-CM | POA: Diagnosis not present

## 2013-10-29 DIAGNOSIS — L570 Actinic keratosis: Secondary | ICD-10-CM | POA: Diagnosis not present

## 2013-11-01 ENCOUNTER — Other Ambulatory Visit: Payer: Self-pay

## 2014-02-10 DIAGNOSIS — E559 Vitamin D deficiency, unspecified: Secondary | ICD-10-CM | POA: Insufficient documentation

## 2014-02-10 DIAGNOSIS — E782 Mixed hyperlipidemia: Secondary | ICD-10-CM | POA: Insufficient documentation

## 2014-02-10 DIAGNOSIS — K219 Gastro-esophageal reflux disease without esophagitis: Secondary | ICD-10-CM | POA: Insufficient documentation

## 2014-02-10 DIAGNOSIS — Z8639 Personal history of other endocrine, nutritional and metabolic disease: Secondary | ICD-10-CM | POA: Insufficient documentation

## 2014-02-10 DIAGNOSIS — I10 Essential (primary) hypertension: Secondary | ICD-10-CM | POA: Insufficient documentation

## 2014-02-11 NOTE — Patient Instructions (Signed)

## 2014-02-11 NOTE — Progress Notes (Deleted)
Patient ID: Brian Proctor, male   DOB: 17-Oct-1936, 77 y.o.   MRN: [id number]   Annual Screening Comprehensive Examination  This very nice 78 y.o.  [name] presents for complete physical.  Patient has been followed for HTN, Diabetes  Prediabetes, Hyperlipidemia, and Vitamin D Deficiency.   HTN predates since     . Patient's BP has been controlled at home.Today's  . Patient denies any cardiac symptoms as chest pain, palpitations, shortness of breath, dizziness or ankle swelling.   Patient's hyperlipidemia is controlled with diet and medications. Patient denies myalgias or other medication SE's. Last cholesterol last visit was  , triglycerides  , HDL    and LDL   .     Patient has prediabetes/insulin resistance with last A1c    / insulin   . Patient denies reactive hypoglycemic symptoms, visual blurring, diabetic polys, or paresthesias.    Finally, patient has history of Vitamin D Deficiency with last vitamin D        Medication List       This list is accurate as of: 02/11/14 11:21 PM.  Always use your most recent med list.               atorvastatin 40 MG tablet  Commonly known as:  LIPITOR  Daily.     fish oil-omega-3 fatty acids 1000 MG capsule  Take 2 g by mouth daily.     fluorouracil 5 % cream  Commonly known as:  EFUDEX     HYDROcodone-acetaminophen 5-500 MG per tablet  Commonly known as:  VICODIN  Take 1 tablet by mouth every 6 (six) hours as needed for pain.     MULTIVITAMIN PO  Take by mouth daily.     PROCTOSOL HC 2.5 % rectal cream  Generic drug:  hydrocortisone  Place rectally 2 (two) times daily.     VITAMIN C PO  Take by mouth daily.     VITAMIN D PO  Take by mouth daily.        No Known Allergies  Past Medical History  Diagnosis Date  . Inguinal hernia   . Hearing loss     wear hearing aid  . No pertinent past medical history   . Wears hearing aid     both ears  . Wears glasses   . Hyperlipidemia   . Hypertension   . Cancer     prostate  - basal cell  . GERD (gastroesophageal reflux disease)   . Prediabetes   . Vitamin D deficiency     Past Surgical History  Procedure Laterality Date  . Tonsillectomy      as a child  . Prostate surgery  1999 or 2000    due to cancer  . Colonoscopy      x2  . Inguinal hernia repair  01/15/2013    Procedure: HERNIA REPAIR INGUINAL ADULT;  Surgeon: Gwenyth Ober, MD;  Location: [facility];  Service: General;  Laterality: Right;    Family History  Problem Relation Age of Onset  . Other Mother     bowel infarction  . COPD Father   . Heart disease Father   . Autism Son     History   Social History  . Marital Status: Married    Spouse Name: N/A    Number of Children: N/A  . Years of Education: N/A   Occupational History  . Not on file.   Social History Main Topics  . Smoking status:  Former Smoker    Quit date: 01/10/1999  . Smokeless tobacco: Never Used  . Alcohol Use: No     Comment: occasional  . Drug Use: No  . Sexual Activity: Not on file   Other Topics Concern  . Not on file   Social History Narrative  . No narrative on file    ROS Constitutional: Denies fever, chills, weight loss/gain, headaches, insomnia, fatigue, night sweats, and change in appetite. Eyes: Denies redness, blurred vision, diplopia, discharge, itchy, watery eyes.  ENT: Denies discharge, congestion, post nasal drip, epistaxis, sore throat, earache, hearing loss, dental pain, Tinnitus, Vertigo, Sinus pain, snoring.  Cardio: Denies chest pain, palpitations, irregular heartbeat, syncope, dyspnea, diaphoresis, orthopnea, PND, claudication, edema Respiratory: denies cough, dyspnea, DOE, pleurisy, hoarseness, laryngitis, wheezing.  Gastrointestinal: Denies dysphagia, heartburn, reflux, water brash, pain, cramps, nausea, vomiting, bloating, diarrhea, constipation, hematemesis, melena, hematochezia, jaundice, hemorrhoids Genitourinary: Denies dysuria, frequency, urgency, nocturia,  hesitancy, discharge, hematuria, flank pain Musculoskeletal: Denies arthralgia, myalgia, stiffness, Jt. Swelling, pain, limp, and strain/sprain. Skin: Denies puritis, rash, hives, warts, acne, eczema, changing in skin lesion Neuro: No weakness, tremor, incoordination, spasms, paresthesia, pain Psychiatric: Denies confusion, memory loss, sensory loss Endocrine: Denies change in weight, skin, hair change, nocturia, and paresthesia, diabetic polys, visual blurring, hyper / hypo glycemic episodes.  Heme/Lymph: No excessive bleeding, bruising, or elarged lymph nodes.  There were no vitals filed for this visit.  Estimated body mass index is 23.04 kg/(m^2) as calculated from the following:   Height as of 01/30/13: 5\' 10"  (1.778 m).   Weight as of 01/30/13: 160 lb 9.6 oz (72.848 kg).  Physical Exam General Appearance: Well nourished, in no apparent distress. Eyes: PERRLA, EOMs, conjunctiva no swelling or erythema, normal fundi and vessels. Sinuses: No frontal/maxillary tenderness ENT/Mouth: EACs patent / TMs  nl. Nares clear without erythema, swelling, mucoid exudates. Oral hygiene is good. No erythema, swelling, or exudate. Tongue normal, non-obstructing. Tonsils not swollen or erythematous. Hearing normal.  Neck: Supple, thyroid normal. No bruits, nodes or JVD. Respiratory: Respiratory effort normal.  BS equal and clear bilateral without rales, rhonci, wheezing or stridor. Cardio: Heart sounds are normal with regular rate and rhythm and no murmurs, rubs or gallops. Peripheral pulses are normal and equal bilaterally without edema. No aortic or femoral bruits. Chest: symmetric with normal excursions and percussion.  Abdomen: Flat, soft, with bowl sounds. Nontender, no guarding, rebound, hernias, masses, or organomegaly.  Lymphatics: Non tender without lymphadenopathy.  Genitourinary: No hernias.Testes nl. DRE - prostate nl for age - smooth & firm w/o nodules. Musculoskeletal: Full ROM all peripheral  extremities, joint stability, 5/5 strength, and normal gait. Skin: Warm and dry without rashes, lesions, cyanosis, clubbing or  ecchymosis.  Neuro: Cranial nerves intact, reflexes equal bilaterally. Normal muscle tone, no cerebellar symptoms. Sensation intact.  Pysch: Awake and oriented X 3, normal affect, insight and judgment appropriate.   Assessment and Plan  1. Annual Screening Examination 2. Hypertension  3. Hyperlipidemia 4. Pre Diabetes 5. Vitamin D Deficiency 6. GERD   Continue prudent diet as discussed, weight control, BP monitoring, regular exercise, and medications as discussed.  Discussed med effects and SE's. Routine screening labs and tests as requested with regular follow-up as recommended.

## 2014-02-12 ENCOUNTER — Encounter: Payer: Self-pay | Admitting: Internal Medicine

## 2014-02-13 ENCOUNTER — Encounter: Payer: Self-pay | Admitting: Internal Medicine

## 2014-02-13 NOTE — Progress Notes (Signed)
Made in error

## 2014-02-18 DIAGNOSIS — L821 Other seborrheic keratosis: Secondary | ICD-10-CM | POA: Diagnosis not present

## 2014-02-18 DIAGNOSIS — D485 Neoplasm of uncertain behavior of skin: Secondary | ICD-10-CM | POA: Diagnosis not present

## 2014-02-18 DIAGNOSIS — Z85828 Personal history of other malignant neoplasm of skin: Secondary | ICD-10-CM | POA: Diagnosis not present

## 2014-02-18 DIAGNOSIS — L570 Actinic keratosis: Secondary | ICD-10-CM | POA: Diagnosis not present

## 2014-02-27 ENCOUNTER — Encounter: Payer: Self-pay | Admitting: Physician Assistant

## 2014-02-27 ENCOUNTER — Ambulatory Visit (INDEPENDENT_AMBULATORY_CARE_PROVIDER_SITE_OTHER): Payer: Medicare Other | Admitting: Physician Assistant

## 2014-02-27 VITALS — BP 110/70 | HR 60 | Temp 97.5°F | Resp 16 | Ht 70.0 in | Wt 155.0 lb

## 2014-02-27 DIAGNOSIS — I10 Essential (primary) hypertension: Secondary | ICD-10-CM | POA: Diagnosis not present

## 2014-02-27 DIAGNOSIS — E559 Vitamin D deficiency, unspecified: Secondary | ICD-10-CM

## 2014-02-27 DIAGNOSIS — Z8546 Personal history of malignant neoplasm of prostate: Secondary | ICD-10-CM

## 2014-02-27 DIAGNOSIS — R7303 Prediabetes: Secondary | ICD-10-CM

## 2014-02-27 DIAGNOSIS — Z79899 Other long term (current) drug therapy: Secondary | ICD-10-CM

## 2014-02-27 DIAGNOSIS — K219 Gastro-esophageal reflux disease without esophagitis: Secondary | ICD-10-CM

## 2014-02-27 DIAGNOSIS — Z0000 Encounter for general adult medical examination without abnormal findings: Secondary | ICD-10-CM | POA: Diagnosis not present

## 2014-02-27 DIAGNOSIS — E785 Hyperlipidemia, unspecified: Secondary | ICD-10-CM | POA: Diagnosis not present

## 2014-02-27 DIAGNOSIS — IMO0001 Reserved for inherently not codable concepts without codable children: Secondary | ICD-10-CM | POA: Diagnosis not present

## 2014-02-27 LAB — CBC WITH DIFFERENTIAL/PLATELET
BASOS ABS: 0.1 10*3/uL (ref 0.0–0.1)
BASOS PCT: 1 % (ref 0–1)
EOS ABS: 0.5 10*3/uL (ref 0.0–0.7)
EOS PCT: 5 % (ref 0–5)
HEMATOCRIT: 45.9 % (ref 39.0–52.0)
HEMOGLOBIN: 15.5 g/dL (ref 13.0–17.0)
Lymphocytes Relative: 22 % (ref 12–46)
Lymphs Abs: 2.1 10*3/uL (ref 0.7–4.0)
MCH: 30.8 pg (ref 26.0–34.0)
MCHC: 33.8 g/dL (ref 30.0–36.0)
MCV: 91.1 fL (ref 78.0–100.0)
MONO ABS: 0.8 10*3/uL (ref 0.1–1.0)
MONOS PCT: 8 % (ref 3–12)
NEUTROS ABS: 6 10*3/uL (ref 1.7–7.7)
Neutrophils Relative %: 64 % (ref 43–77)
Platelets: 261 10*3/uL (ref 150–400)
RBC: 5.04 MIL/uL (ref 4.22–5.81)
RDW: 14.1 % (ref 11.5–15.5)
WBC: 9.4 10*3/uL (ref 4.0–10.5)

## 2014-02-27 LAB — BASIC METABOLIC PANEL WITH GFR
BUN: 13 mg/dL (ref 6–23)
CO2: 28 mEq/L (ref 19–32)
CREATININE: 0.86 mg/dL (ref 0.50–1.35)
Calcium: 9.2 mg/dL (ref 8.4–10.5)
Chloride: 102 mEq/L (ref 96–112)
GFR, Est Non African American: 84 mL/min
GLUCOSE: 76 mg/dL (ref 70–99)
POTASSIUM: 4.2 meq/L (ref 3.5–5.3)
Sodium: 139 mEq/L (ref 135–145)

## 2014-02-27 LAB — LIPID PANEL
Cholesterol: 170 mg/dL (ref 0–200)
HDL: 48 mg/dL (ref >39)
LDL CALC: 99 mg/dL (ref 0–99)
TRIGLYCERIDES: 116 mg/dL (ref <150)
Total CHOL/HDL Ratio: 3.5 Ratio
VLDL: 23 mg/dL (ref 0–40)

## 2014-02-27 LAB — HEPATIC FUNCTION PANEL
ALBUMIN: 4.4 g/dL (ref 3.5–5.2)
ALK PHOS: 62 U/L (ref 39–117)
ALT: 22 U/L (ref 0–53)
AST: 29 U/L (ref 0–37)
BILIRUBIN INDIRECT: 0.6 mg/dL (ref 0.2–1.2)
Bilirubin, Direct: 0.1 mg/dL (ref 0.0–0.3)
TOTAL PROTEIN: 7.1 g/dL (ref 6.0–8.3)
Total Bilirubin: 0.7 mg/dL (ref 0.2–1.2)

## 2014-02-27 LAB — MAGNESIUM: Magnesium: 2 mg/dL (ref 1.5–2.5)

## 2014-02-27 LAB — CK: Total CK: 153 U/L (ref 7–232)

## 2014-02-27 NOTE — Progress Notes (Signed)
Subjective:  Brian Proctor is a 78 y.o. male who presents for Medicare Annual Wellness Visit and CPE.  Date of last medicare wellness visit is unknown.  His blood pressure has been controlled at home, today their BP is BP: 110/70 mmHg He does workout, walks 3-4 miles daily, golf 2-3 days a week. He denies chest pain, shortness of breath, dizziness.  He is on cholesterol medication and has some myalgias. His cholesterol is not at goal so he was put back on the lipitor but is taking 1/2 pill every day. The cholesterol last visit was: LDL 141 (101)  Last A1C in the office was: 5.3 Patient is on Vitamin D supplement.     Names of Other Physician/Practitioners you currently use: 1. [facility] here for primary care 2. [facility], eye doctor, last visit 02/2013- due, bifocals 3. [facility], dentist, last visit 4 months ago 4. Dr. Oletta Lamas, GI 5. Dr. Risa Grill, Urologist 6. Dr.Jones , Derm, 2 times a year  Medication Review: Current Outpatient Prescriptions on File Prior to Visit  Medication Sig Dispense Refill  . Ascorbic Acid (VITAMIN C PO) Take by mouth daily.      [name] atorvastatin (LIPITOR) 40 MG tablet Daily.      . Cholecalciferol (VITAMIN D PO) Take by mouth daily.      . fish oil-omega-3 fatty acids 1000 MG capsule Take 2 g by mouth daily.      . fluorouracil (EFUDEX) 5 % cream       . Multiple Vitamins-Minerals (MULTIVITAMIN PO) Take by mouth daily.      [name] PROCTOSOL HC 2.5 % rectal cream Place rectally 2 (two) times daily.        No current facility-administered medications on file prior to visit.    Current Problems (verified) Patient Active Problem List   Diagnosis Date Noted  . Hyperlipidemia   . Hypertension   . Cancer   . GERD (gastroesophageal reflux disease)   . Prediabetes   . Vitamin D deficiency   . Postop check 01/30/2013  . Right inguinal hernia 12/12/2012    Immunization History  Administered Date(s) Administered   . Pneumococcal-Unspecified 02/03/2011  . Td 07/26/2005  . Zoster 12/27/2006    Screening Tests Health Maintenance  Topic Date Due  . Colonoscopy  01/08/1988  . Influenza Vaccine  07/27/2013  . Tetanus/tdap  07/27/2015  . Pneumococcal Polysaccharide Vaccine Age 36 And Over  Completed  . Zostavax  Completed    Preventative care: Last colonoscopy: 04/2012 Dr. Oletta Lamas  Prior vaccinations: TD or Tdap: 2006  Influenza: 09/2013  Pneumococcal: 2012 Shingles/Zostavax: 2008  History reviewed: allergies, current medications, past family history, past medical history, past social history, past surgical history and problem list   Risk Factors: Tobacco History  Smoking status  . Former Smoker  . Quit date: 01/10/1999  Smokeless tobacco  . Never Used   He does not smoke.  Patient is a former smoker. Are there smokers in your home (other than you)?  Yes  Alcohol History  Alcohol Use No    Comment: occasional   Current alcohol use: none  Caffeine Current caffeine use: coffee 1-2 /day  Exercise Current exercise habits: Home exercise routine includes treadmill and walking 1 hrs per day.  Current exercise: gardening, professional sports (golf), walking and yard work  Nutrition/Diet Current diet: in general, a "healthy" diet    Cardiac risk factors: advanced age (older than 30 for men, 95 for women),  dyslipidemia, family history of premature cardiovascular disease, hypertension and male gender.  Depression Screen Nurse depression screen reviewed.  (Note: if answer to either of the following is "Yes", a more complete depression screening is indicated)   Q1: Over the past two weeks, have you felt down, depressed or hopeless? No  Q2: Over the past two weeks, have you felt little interest or pleasure in doing things? No  Have you lost interest or pleasure in daily life? No  Do you often feel hopeless? No  Do you cry easily over simple problems? No  Activities of Daily  Living Nurse ADLs screen reviewed.  In your present state of health, do you have any difficulty performing the following activities?:  Driving? No Managing money?  No Feeding yourself? No Getting from bed to chair? No Climbing a flight of stairs? No Preparing food and eating?: No Bathing or showering? No Getting dressed: No Getting to the toilet? No Using the toilet:No Moving around from place to place: No In the past year have you fallen or had a near fall?:No   Are you sexually active?  No  Do you have more than one partner?  No  Vision Difficulties: No  Hearing Difficulties: Yes, has hearing aids Do you often ask people to speak up or repeat themselves? Yes Do you experience ringing or noises in your ears? No Do you have difficulty understanding soft or whispered voices? Yes  Cognition  Do you feel that you have a problem with memory? Yes, anomia occ  Do you often misplace items? Yes  Do you feel safe at home?  Yes  Advanced directives Does patient have a Roberts? Yes Does patient have a Living Will? Yes   Objective:     Vision and hearing screens reviewed.   Blood pressure 110/70, pulse 60, temperature 97.5 F (36.4 C), resp. rate 16, height 5\' 10"  (1.778 m), weight 155 lb (70.308 kg). Body mass index is 22.24 kg/(m^2).  General appearance: alert, no distress, WD/WN, male Cognitive Testing  Alert? Yes  Normal Appearance?Yes  Oriented to person? Yes  Place? Yes   Time? Yes  Recall of three objects?  No  Can perform simple calculations? Yes  Displays appropriate judgment?Yes  Can read the correct time from a watch face?Yes  HEENT: normocephalic, sclerae anicteric, TMs pearly, nares patent, no discharge or erythema, pharynx normal Oral cavity: MMM, no lesions Neck: supple, no lymphadenopathy, no thyromegaly, no masses Heart: RRR, normal S1, S2, no murmurs Lungs: CTA bilaterally, no wheezes, rhonchi, or rales Abdomen: +bs, flat, soft,  non tender, non distended, no masses, no hepatomegaly, no splenomegaly Musculoskeletal: nontender, no swelling, no obvious deformity Extremities: no edema, no cyanosis, no clubbing Pulses: 2+ symmetric, upper and lower extremities, normal cap refill Neurological: alert, oriented x 3, CN2-12 intact, strength normal upper extremities and lower extremities, sensation normal throughout, DTRs 2+ throughout, no cerebellar signs, gait normal Psychiatric: normal affect, behavior normal, pleasant   Assessment:   1. Hypertension - At goal -  CBC with Differential - BASIC METABOLIC PANEL WITH GFR - Hepatic function panel - TSH - Urinalysis, Routine w reflex microscopic - Microalbumin / creatinine urine ratio - EKG 12-Lead - [place], RETROPERITNL ABD,  LTD  2. GERD (gastroesophageal reflux disease) controlled  3. Prediabetes - controlled, continue same  4. Hyperlipidemia - -controlled, continue the same medications - Lipid panel  5. Vitamin D deficiency  6. Encounter for long-term (current) use of other medications - Magnesium  7. History of prostate cancer - PSA  8. Myalgia and myositis - CK   Plan:   During the course of the visit the patient was educated and counseled about appropriate screening and preventive services including:    Screening electrocardiogram  Diabetes screening  Glaucoma screening  Screening recommendations, referrals: Vaccinations: Tdap vaccine not done  Influenza vaccine not done Pneumococcal vaccine not done Shingles vaccine not done Hep B vaccine not done  Nutrition assessed and recommended  Colonoscopy no Recommended yearly ophthalmology/optometry visit for glaucoma screening and checkup Recommended yearly dental visit for hygiene and checkup Advanced directives - no  Conditions/risks identified: BMI: Discussed weight loss, diet, and increase physical activity.  Increase physical activity: AHA recommends 150 minutes of physical activity a  week.  Medications reviewed Urinary Incontinence is not an issue: discussed non pharmacology and pharmacology options.  Fall risk: low- discussed PT, home fall assessment, medications.   Medicare Attestation I have personally reviewed: The patient's medical and social history Their use of alcohol, tobacco or illicit drugs Their current medications and supplements The patient's functional ability including ADLs,fall risks, home safety risks, cognitive, and hearing and visual impairment Diet and physical activities Evidence for depression or mood disorders  The patient's weight, height, BMI, and visual acuity have been recorded in the chart.  I have made referrals, counseling, and provided education to the patient based on review of the above and I have provided the patient with a written personalized care plan for preventive services.     Vicie Mutters, PA-C   02/27/2014

## 2014-02-27 NOTE — Patient Instructions (Signed)
Preventative Care for Adults, Male       REGULAR HEALTH EXAMS:  A routine yearly physical is a good way to check in with your primary care provider about your health and preventive screening. It is also an opportunity to share updates about your health and any concerns you have, and receive a thorough all-over exam.   Most health insurance companies pay for at least some preventative services.  Check with your health plan for specific coverages.  WHAT PREVENTATIVE SERVICES DO MEN NEED?  Adult men should have their weight and blood pressure checked regularly.   Men age 35 and older should have their cholesterol levels checked regularly.  Beginning at age 50 and continuing to age 75, men should be screened for colorectal cancer.  Certain people should may need continued testing until age 85.  Other cancer screening may include exams for testicular and prostate cancer.  Updating vaccinations is part of preventative care.  Vaccinations help protect against diseases such as the flu.  Lab tests are generally done as part of preventative care to screen for anemia and blood disorders, to screen for problems with the kidneys and liver, to screen for bladder problems, to check blood sugar, and to check your cholesterol level.  Preventative services generally include counseling about diet, exercise, avoiding tobacco, drugs, excessive alcohol consumption, and sexually transmitted infections.    GENERAL RECOMMENDATIONS FOR GOOD HEALTH:  Healthy diet:  Eat a variety of foods, including fruit, vegetables, animal or vegetable protein, such as meat, fish, chicken, and eggs, or beans, lentils, tofu, and grains, such as rice.  Drink plenty of water daily.  Decrease saturated fat in the diet, avoid lots of red meat, processed foods, sweets, fast foods, and fried foods.  Exercise:  Aerobic exercise helps maintain good heart health. At least 30-40 minutes of moderate-intensity exercise is recommended.  For example, a brisk walk that increases your heart rate and breathing. This should be done on most days of the week.   Find a type of exercise or a variety of exercises that you enjoy so that it becomes a part of your daily life.  Examples are running, walking, swimming, water aerobics, and biking.  For motivation and support, explore group exercise such as aerobic class, spin class, Zumba, Yoga,or  martial arts, etc.    Set exercise goals for yourself, such as a certain weight goal, walk or run in a race such as a 5k walk/run.  Speak to your primary care provider about exercise goals.  Disease prevention:  If you smoke or chew tobacco, find out from your caregiver how to quit. It can literally save your life, no matter how long you have been a tobacco user. If you do not use tobacco, never begin.   Maintain a healthy diet and normal weight. Increased weight leads to problems with blood pressure and diabetes.   The Body Mass Index or BMI is a way of measuring how much of your body is fat. Having a BMI above 27 increases the risk of heart disease, diabetes, hypertension, stroke and other problems related to obesity. Your caregiver can help determine your BMI and based on it develop an exercise and dietary program to help you achieve or maintain this important measurement at a healthful level.  High blood pressure causes heart and blood vessel problems.  Persistent high blood pressure should be treated with medicine if weight loss and exercise do not work.   Fat and cholesterol leaves deposits in your arteries   that can block them. This causes heart disease and vessel disease elsewhere in your body.  If your cholesterol is found to be high, or if you have heart disease or certain other medical conditions, then you may need to have your cholesterol monitored frequently and be treated with medication.   Ask if you should have a stress test if your history suggests this. A stress test is a test done on  a treadmill that looks for heart disease. This test can find disease prior to there being a problem.  Avoid drinking alcohol in excess (more than two drinks per day).  Avoid use of street drugs. Do not share needles with anyone. Ask for professional help if you need assistance or instructions on stopping the use of alcohol, cigarettes, and/or drugs.  Brush your teeth twice a day with fluoride toothpaste, and floss once a day. Good oral hygiene prevents tooth decay and gum disease. The problems can be painful, unattractive, and can cause other health problems. Visit your dentist for a routine oral and dental check up and preventive care every 6-12 months.   Look at your skin regularly.  Use a mirror to look at your back. Notify your caregivers of changes in moles, especially if there are changes in shapes, colors, a size larger than a pencil eraser, an irregular border, or development of new moles.  Safety:  Use seatbelts 100% of the time, whether driving or as a passenger.  Use safety devices such as hearing protection if you work in environments with loud noise or significant background noise.  Use safety glasses when doing any work that could send debris in to the eyes.  Use a helmet if you ride a bike or motorcycle.  Use appropriate safety gear for contact sports.  Talk to your caregiver about gun safety.  Use sunscreen with a SPF (or skin protection factor) of 15 or greater.  Lighter skinned people are at a greater risk of skin cancer. Don't forget to also wear sunglasses in order to protect your eyes from too much damaging sunlight. Damaging sunlight can accelerate cataract formation.   Practice safe sex. Use condoms. Condoms are used for birth control and to help reduce the spread of sexually transmitted infections (or STIs).  Some of the STIs are gonorrhea (the clap), chlamydia, syphilis, trichomonas, herpes, HPV (human papilloma virus) and HIV (human immunodeficiency virus) which causes AIDS.  The herpes, HIV and HPV are viral illnesses that have no cure. These can result in disability, cancer and death.   Keep carbon monoxide and smoke detectors in your home functioning at all times. Change the batteries every 6 months or use a model that plugs into the wall.   Vaccinations:  Stay up to date with your tetanus shots and other required immunizations. You should have a booster for tetanus every 10 years. Be sure to get your flu shot every year, since 5%-20% of the [place] population comes down with the flu. The flu vaccine changes each year, so being vaccinated once is not enough. Get your shot in the fall, before the flu season peaks.   Other vaccines to consider:  Pneumococcal vaccine to protect against certain types of pneumonia.  This is normally recommended for adults age 65 or older.  However, adults younger than 76 years old with certain underlying conditions such as diabetes, heart or lung disease should also receive the vaccine.  Shingles vaccine to protect against Varicella Zoster if you are older than age 60, or younger   than 75 years old with certain underlying illness.  Hepatitis A vaccine to protect against a form of infection of the liver by a virus acquired from food.  Hepatitis B vaccine to protect against a form of infection of the liver by a virus acquired from blood or body fluids, particularly if you work in health care.  If you plan to travel internationally, check with your local health department for specific vaccination recommendations.  Cancer Screening:  Most routine colon cancer screening begins at the age of 22. On a yearly basis, doctors may provide special easy to use take-home tests to check for hidden blood in the stool. Sigmoidoscopy or colonoscopy can detect the earliest forms of colon cancer and is life saving. These tests use a small camera at the end of a tube to directly examine the colon. Speak to your caregiver about this at age 55, when routine  screening begins (and is repeated every 5 years unless early forms of pre-cancerous polyps or small growths are found).   At the age of 39 men usually start screening for prostate cancer every year. Screening may begin at a younger age for those with higher risk. Those at higher risk include African-Americans or having a family history of prostate cancer. There are two types of tests for prostate cancer:   Prostate-specific antigen (PSA) testing. Recent studies raise questions about prostate cancer using PSA and you should discuss this with your caregiver.   Digital rectal exam (in which your doctor's lubricated and gloved finger feels for enlargement of the prostate through the anus).   Screening for testicular cancer.  Do a monthly exam of your testicles. Gently roll each testicle between your thumb and fingers, feeling for any abnormal lumps. The best time to do this is after a hot shower or bath when the tissues are looser. Notify your caregivers of any lumps, tenderness or changes in size or shape immediately.     Your LDL is the bad cholesterol that can lead to heart attack and stroke. To lower your number you can decrease your fatty foods, red meat, cheese, milk and increase fiber like whole grains and veggies. You can also add a fiber supplement like Metamucil can cause stomach pain or Benefiber that does not cause stomach pain.

## 2014-02-28 LAB — URINALYSIS, ROUTINE W REFLEX MICROSCOPIC
Bilirubin Urine: NEGATIVE
Glucose, UA: NEGATIVE mg/dL
HGB URINE DIPSTICK: NEGATIVE
Ketones, ur: NEGATIVE mg/dL
LEUKOCYTES UA: NEGATIVE
NITRITE: NEGATIVE
PROTEIN: NEGATIVE mg/dL
Specific Gravity, Urine: <1.005 — ABNORMAL LOW (ref 1.005–1.030)
Urobilinogen, UA: 0.2 mg/dL (ref 0.0–1.0)
pH: 7 (ref 5.0–8.0)

## 2014-02-28 LAB — TSH: TSH: 2.981 u[IU]/mL (ref 0.350–4.500)

## 2014-02-28 LAB — MICROALBUMIN / CREATININE URINE RATIO
Creatinine, Urine: 52.3 mg/dL
MICROALB UR: 1.18 mg/dL (ref 0.00–1.89)
Microalb Creat Ratio: 22.6 mg/g (ref 0.0–30.0)

## 2014-02-28 LAB — PSA

## 2014-03-20 DIAGNOSIS — D2360 Other benign neoplasm of skin of unspecified upper limb, including shoulder: Secondary | ICD-10-CM | POA: Diagnosis not present

## 2014-03-20 DIAGNOSIS — Z85828 Personal history of other malignant neoplasm of skin: Secondary | ICD-10-CM | POA: Diagnosis not present

## 2014-03-20 DIAGNOSIS — L821 Other seborrheic keratosis: Secondary | ICD-10-CM | POA: Diagnosis not present

## 2014-03-20 DIAGNOSIS — L570 Actinic keratosis: Secondary | ICD-10-CM | POA: Diagnosis not present

## 2014-05-03 ENCOUNTER — Other Ambulatory Visit: Payer: Self-pay | Admitting: Internal Medicine

## 2014-05-06 ENCOUNTER — Other Ambulatory Visit: Payer: Self-pay | Admitting: Physician Assistant

## 2014-05-06 MED ORDER — HYDROCORTISONE 2.5 % RE CREA
1.0000 | TOPICAL_CREAM | Freq: Two times a day (BID) | RECTAL | Status: DC
Start: 2014-05-06 — End: 2014-12-30

## 2014-07-03 ENCOUNTER — Ambulatory Visit (INDEPENDENT_AMBULATORY_CARE_PROVIDER_SITE_OTHER): Payer: Medicare Other | Admitting: Internal Medicine

## 2014-07-03 ENCOUNTER — Encounter: Payer: Self-pay | Admitting: Internal Medicine

## 2014-07-03 VITALS — BP 108/72 | HR 64 | Temp 97.5°F | Resp 16 | Ht 70.0 in | Wt 149.4 lb

## 2014-07-03 DIAGNOSIS — E785 Hyperlipidemia, unspecified: Secondary | ICD-10-CM | POA: Diagnosis not present

## 2014-07-03 DIAGNOSIS — I10 Essential (primary) hypertension: Secondary | ICD-10-CM | POA: Diagnosis not present

## 2014-07-03 DIAGNOSIS — R7303 Prediabetes: Secondary | ICD-10-CM

## 2014-07-03 DIAGNOSIS — E559 Vitamin D deficiency, unspecified: Secondary | ICD-10-CM

## 2014-07-03 DIAGNOSIS — Z8546 Personal history of malignant neoplasm of prostate: Secondary | ICD-10-CM | POA: Insufficient documentation

## 2014-07-03 DIAGNOSIS — Z79899 Other long term (current) drug therapy: Secondary | ICD-10-CM | POA: Diagnosis not present

## 2014-07-03 DIAGNOSIS — R7309 Other abnormal glucose: Secondary | ICD-10-CM

## 2014-07-03 LAB — CBC WITH DIFFERENTIAL/PLATELET
BASOS PCT: 1 % (ref 0–1)
Basophils Absolute: 0.1 10*3/uL (ref 0.0–0.1)
EOS ABS: 0.4 10*3/uL (ref 0.0–0.7)
EOS PCT: 5 % (ref 0–5)
HEMATOCRIT: 44.8 % (ref 39.0–52.0)
HEMOGLOBIN: 15.9 g/dL (ref 13.0–17.0)
Lymphocytes Relative: 23 % (ref 12–46)
Lymphs Abs: 1.8 10*3/uL (ref 0.7–4.0)
MCH: 31.1 pg (ref 26.0–34.0)
MCHC: 35.5 g/dL (ref 30.0–36.0)
MCV: 87.7 fL (ref 78.0–100.0)
MONO ABS: 0.6 10*3/uL (ref 0.1–1.0)
MONOS PCT: 8 % (ref 3–12)
Neutro Abs: 4.9 10*3/uL (ref 1.7–7.7)
Neutrophils Relative %: 63 % (ref 43–77)
Platelets: 271 10*3/uL (ref 150–400)
RBC: 5.11 MIL/uL (ref 4.22–5.81)
RDW: 13.7 % (ref 11.5–15.5)
WBC: 7.8 10*3/uL (ref 4.0–10.5)

## 2014-07-03 LAB — LIPID PANEL
CHOLESTEROL: 174 mg/dL (ref 0–200)
HDL: 41 mg/dL (ref >39)
LDL Cholesterol: 116 mg/dL — ABNORMAL HIGH (ref 0–99)
Total CHOL/HDL Ratio: 4.2 Ratio
Triglycerides: 86 mg/dL (ref <150)
VLDL: 17 mg/dL (ref 0–40)

## 2014-07-03 LAB — HEPATIC FUNCTION PANEL
ALT: 19 U/L (ref 0–53)
AST: 25 U/L (ref 0–37)
Albumin: 4.2 g/dL (ref 3.5–5.2)
Alkaline Phosphatase: 65 U/L (ref 39–117)
BILIRUBIN INDIRECT: 0.6 mg/dL (ref 0.2–1.2)
Bilirubin, Direct: 0.1 mg/dL (ref 0.0–0.3)
TOTAL PROTEIN: 6.5 g/dL (ref 6.0–8.3)
Total Bilirubin: 0.7 mg/dL (ref 0.2–1.2)

## 2014-07-03 LAB — TSH: TSH: 2.506 u[IU]/mL (ref 0.350–4.500)

## 2014-07-03 LAB — BASIC METABOLIC PANEL WITH GFR
BUN: 12 mg/dL (ref 6–23)
CALCIUM: 9.4 mg/dL (ref 8.4–10.5)
CO2: 29 mEq/L (ref 19–32)
CREATININE: 1.06 mg/dL (ref 0.50–1.35)
Chloride: 105 mEq/L (ref 96–112)
GFR, EST AFRICAN AMERICAN: 78 mL/min
GFR, Est Non African American: 68 mL/min
GLUCOSE: 89 mg/dL (ref 70–99)
POTASSIUM: 4.7 meq/L (ref 3.5–5.3)
Sodium: 140 mEq/L (ref 135–145)

## 2014-07-03 LAB — HEMOGLOBIN A1C
HEMOGLOBIN A1C: 5.5 % (ref <5.7)
MEAN PLASMA GLUCOSE: 111 mg/dL (ref <117)

## 2014-07-03 LAB — MAGNESIUM: Magnesium: 2 mg/dL (ref 1.5–2.5)

## 2014-07-03 NOTE — Patient Instructions (Signed)

## 2014-07-03 NOTE — Progress Notes (Signed)
Patient ID: Brian Proctor, male   DOB: 02-Jun-1936, 78 y.o.   MRN: [id number]   This very nice 78 y.o.[name] presents for 3 month follow up with Hypertension, Hyperlipidemia, Pre-Diabetes and Vitamin D Deficiency.    Patient has labile HTN predates since 2003 and has been monitored expectantly. BP has been controlled at home. Today's BP: 108/72 mmHg. Patient denies any cardiac type chest pain, palpitations, dyspnea/orthopnea/PND, dizziness, claudication, or dependent edema.   Hyperlipidemia is controlled with diet & meds. Patient denies myalgias or other med SE's. Last Lipids were at goal as below in Mar. Lab Results  Component Value Date   CHOL 170 02/27/2014   HDL 48 02/27/2014   LDLCALC 99 02/27/2014   TRIG 116 02/27/2014   CHOLHDL 3.5 02/27/2014    Also, the patient has history of PreDiabetes with A1c 5.7% since 2011 and last A1c was   5.3% in May 2014 and patient has lost 6# since then from better food choices. Patient denies any symptoms of reactive hypoglycemia, diabetic polys, paresthesias or visual blurring.   Further, Patient has history of Vitamin D Deficiency and last vitamin D was 57 in May 2014. Patient supplements vitamin D without any suspected side-effects.   Medication List   aspirin 81 MG tablet  Take 81 mg by mouth daily.     atorvastatin 40 MG tablet  Commonly known as:  LIPITOR  Daily.     fish oil-omega-3 fatty acids 1000 MG capsule  Take 2 g by mouth daily.     fluorouracil 5 % cream  Commonly known as:  EFUDEX     hydrocortisone 2.5 % rectal cream  Commonly known as:  ANUSOL-HC  Place 1 application rectally 2 (two) times daily.     MULTIVITAMIN PO  Take by mouth daily.     VITAMIN C PO  Take by mouth daily.     VITAMIN D PO  Take by mouth daily.        No Known Allergies  PMHx:   Past Medical History  Diagnosis Date  . Inguinal hernia   . Hearing loss     wear hearing aid  . No pertinent past medical history   . Wears hearing aid     both ears  .  Wears glasses   . Hyperlipidemia   . Hypertension   . Cancer     prostate - basal cell  . GERD (gastroesophageal reflux disease)   . Prediabetes   . Vitamin D deficiency    FHx:    Reviewed / unchanged  SHx:    Reviewed / unchanged  Systems Review:  Constitutional: Denies fever, chills, wt changes, headaches, insomnia, fatigue, night sweats, change in appetite. Eyes: Denies redness, blurred vision, diplopia, discharge, itchy, watery eyes.  ENT: Denies discharge, congestion, post nasal drip, epistaxis, sore throat, earache, hearing loss, dental pain, tinnitus, vertigo, sinus pain, snoring.  CV: Denies chest pain, palpitations, irregular heartbeat, syncope, dyspnea, diaphoresis, orthopnea, PND, claudication or edema. Respiratory: denies cough, dyspnea, DOE, pleurisy, hoarseness, laryngitis, wheezing.  Gastrointestinal: Denies dysphagia, odynophagia, heartburn, reflux, water brash, abdominal pain or cramps, nausea, vomiting, bloating, diarrhea, constipation, hematemesis, melena, hematochezia  or hemorrhoids. Genitourinary: Denies dysuria, frequency, urgency, nocturia, hesitancy, discharge, hematuria or flank pain. Musculoskeletal: Denies arthralgias, myalgias, stiffness, jt. swelling, pain, limping or strain/sprain.  Skin: Denies pruritus, rash, hives, warts, acne, eczema or change in skin lesion(s). Neuro: No weakness, tremor, incoordination, spasms, paresthesia or pain. Psychiatric: Denies confusion, memory loss or sensory loss. Endo: Denies  change in weight, skin or hair change.  Heme/Lymph: No excessive bleeding, bruising or enlarged lymph nodes. Exam:  BP 108/72  Pulse 64  Temp 97.5 F Resp 16  Ht 5\' 10"    Wt 149 lb 6.4 oz   BMI 21.44 kg/m2  Appears well nourished and in no distress. Eyes: PERRLA, EOMs, conjunctiva no swelling or erythema. Sinuses: No frontal/maxillary tenderness ENT/Mouth: EAC's clear, TM's nl w/o erythema, bulging. Nares clear w/o erythema, swelling,  exudates. Oropharynx clear without erythema or exudates. Oral hygiene is good. Tongue normal, non obstructing. Hearing intact.  Neck: Supple. Thyroid nl. Car 2+/2+ without bruits, nodes or JVD. Chest: Respirations nl with BS clear & equal w/o rales, rhonchi, wheezing or stridor.  Cor: Heart sounds normal w/ regular rate and rhythm without sig. murmurs, gallops, clicks, or rubs. Peripheral pulses normal and equal  without edema.  Abdomen: Soft & bowel sounds normal. Non-tender w/o guarding, rebound, hernias, masses, or organomegaly.  Lymphatics: Unremarkable.  Musculoskeletal: Full ROM all peripheral extremities, joint stability, 5/5 strength, and normal gait.  Skin: Warm, dry without exposed rashes, lesions or ecchymosis apparent.  Neuro: Cranial nerves intact, reflexes equal bilaterally. Sensory-motor testing grossly intact. Tendon reflexes grossly intact.  Pysch: Alert & oriented x 3. Insight and judgement nl & appropriate. No ideations.  Assessment and Plan:  1. Hypertension,labile  - Continue monitor blood pressure at home. Continue diet/meds same.  2. Hyperlipidemia - Continue diet/meds, exercise,& lifestyle modifications. Continue monitor periodic cholesterol/liver & renal functions   3. Pre-Diabetes - Continue diet, exercise, lifestyle modifications. Monitor appropriate labs.  4. Vitamin D Deficiency - Continue supplementation.  Recommended regular exercise, BP monitoring, weight control, and discussed med and SE's. Recommended labs to assess and monitor clinical status. Further disposition pending results of labs.

## 2014-07-04 LAB — VITAMIN D 25 HYDROXY (VIT D DEFICIENCY, FRACTURES): Vit D, 25-Hydroxy: 87 ng/mL (ref 30–89)

## 2014-07-04 LAB — INSULIN, FASTING: Insulin fasting, serum: 9 u[IU]/mL (ref 3–28)

## 2014-08-19 DIAGNOSIS — D485 Neoplasm of uncertain behavior of skin: Secondary | ICD-10-CM | POA: Diagnosis not present

## 2014-08-19 DIAGNOSIS — Z85828 Personal history of other malignant neoplasm of skin: Secondary | ICD-10-CM | POA: Diagnosis not present

## 2014-08-19 DIAGNOSIS — L821 Other seborrheic keratosis: Secondary | ICD-10-CM | POA: Diagnosis not present

## 2014-08-19 DIAGNOSIS — D2370 Other benign neoplasm of skin of unspecified lower limb, including hip: Secondary | ICD-10-CM | POA: Diagnosis not present

## 2014-08-19 DIAGNOSIS — D239 Other benign neoplasm of skin, unspecified: Secondary | ICD-10-CM | POA: Diagnosis not present

## 2014-08-19 DIAGNOSIS — L570 Actinic keratosis: Secondary | ICD-10-CM | POA: Diagnosis not present

## 2014-08-19 DIAGNOSIS — C44320 Squamous cell carcinoma of skin of unspecified parts of face: Secondary | ICD-10-CM | POA: Diagnosis not present

## 2014-08-24 IMAGING — US US PELVIS LIMITED
1 series · 6 of 6 positions shown · non-contrast
Comparison: None.

CLINICAL DATA: Right groin pain

US PELVIS LIMITED OR FOLLOW UP

[Series 1: us pelvis limited · 0.13mm/px · 6 of 6 slices shown]
[im 1/6]
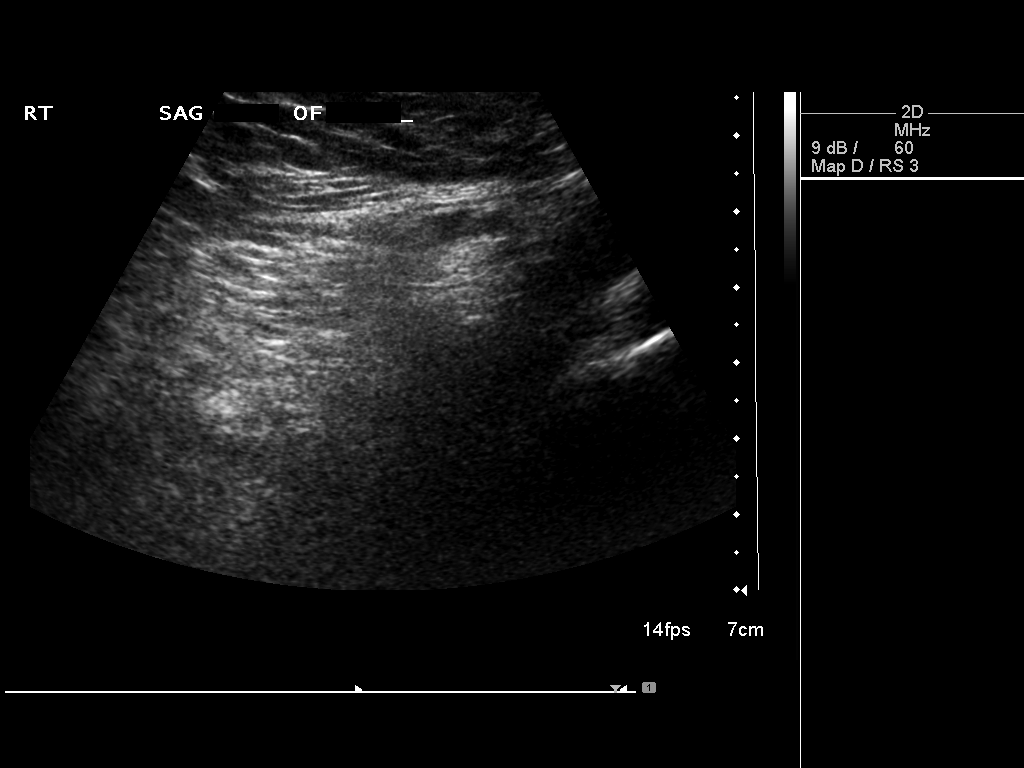
[im 2/6]
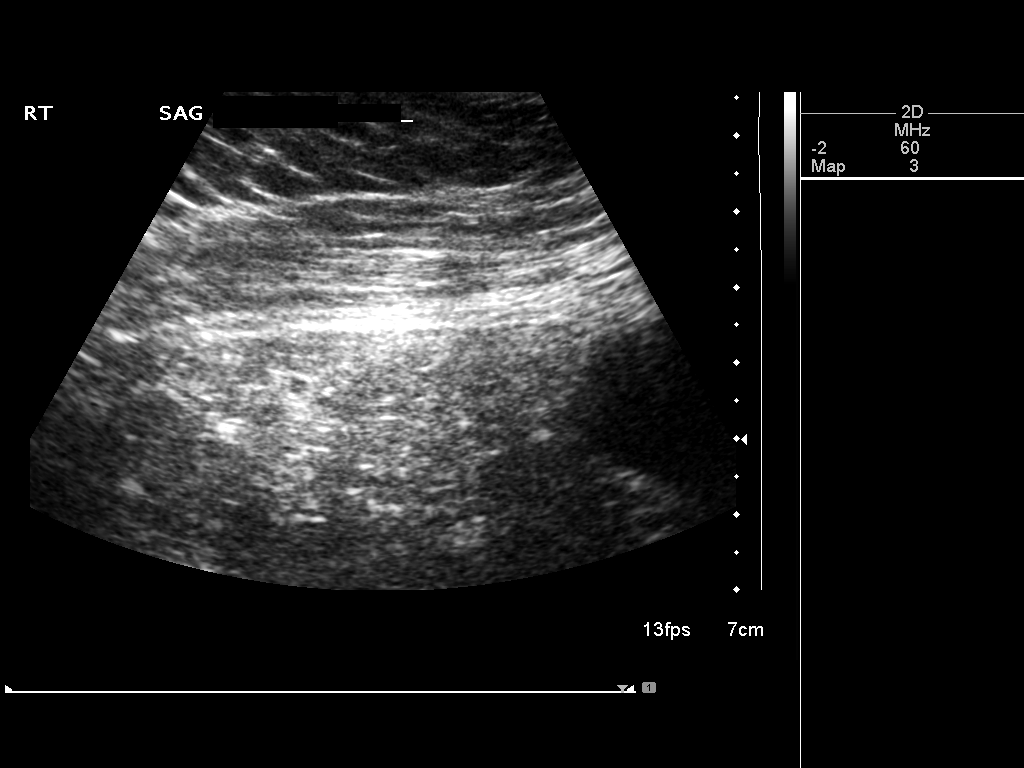
[im 3/6]
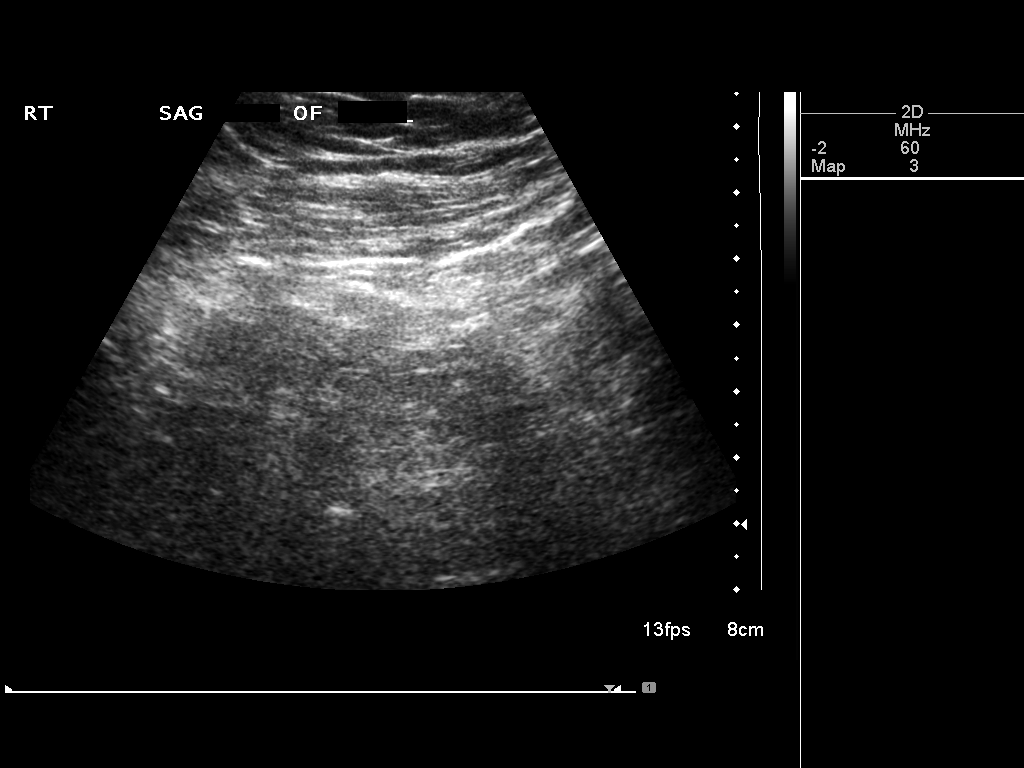
[im 4/6]
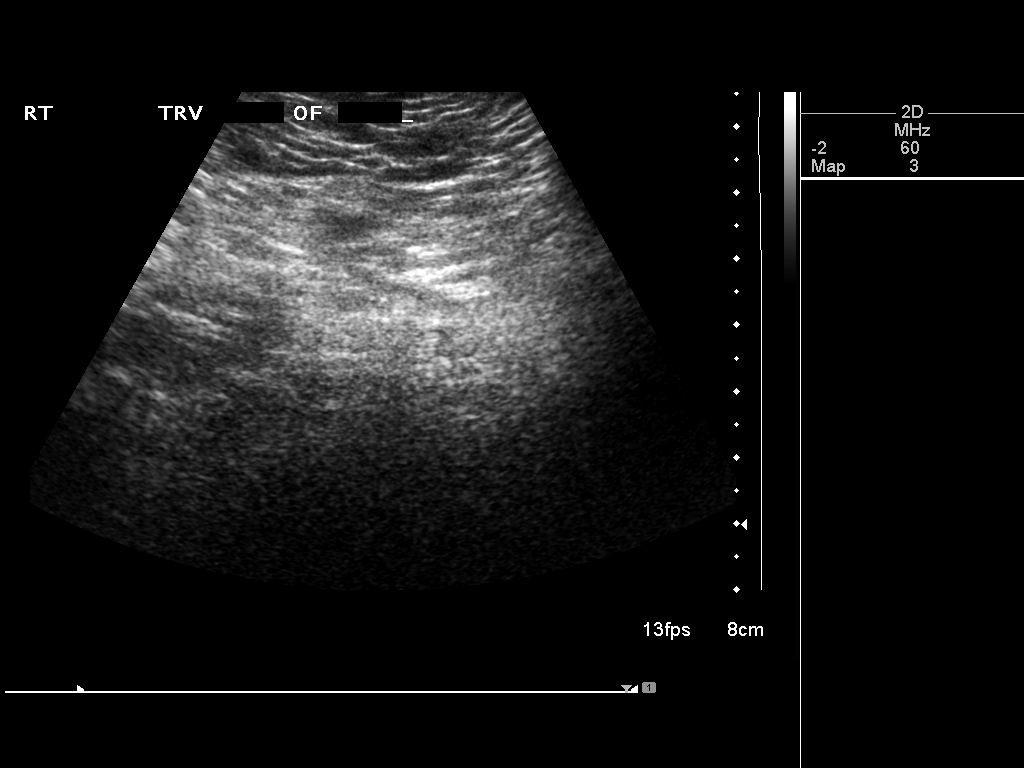
[im 5/6]
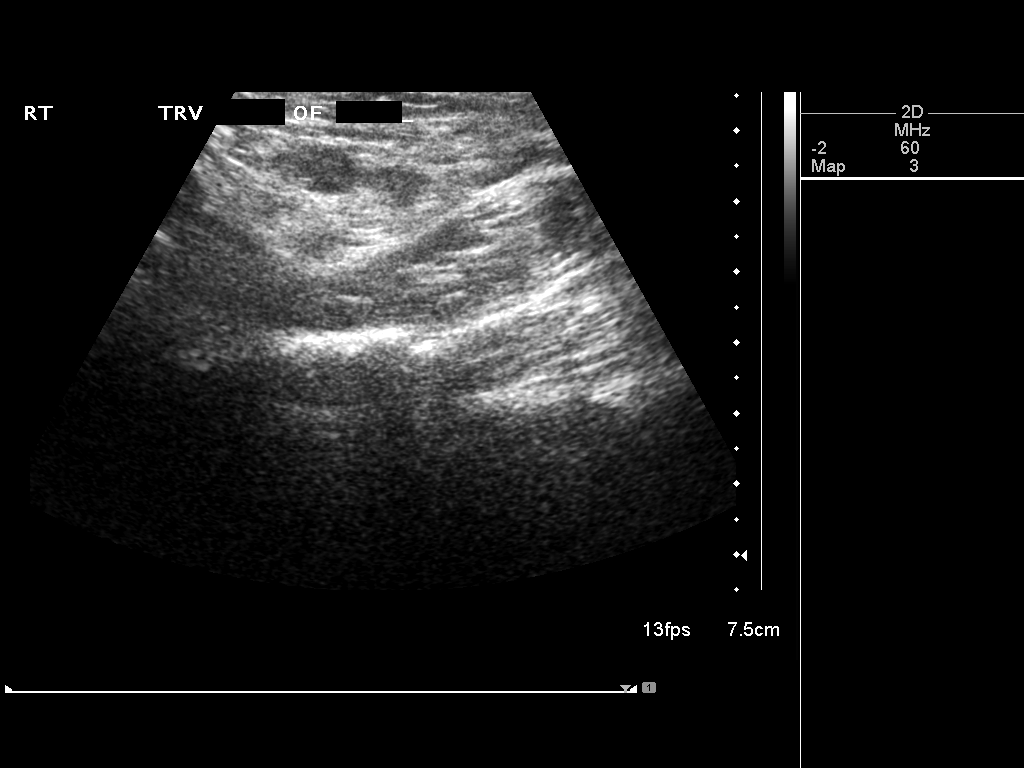
[im 6/6]
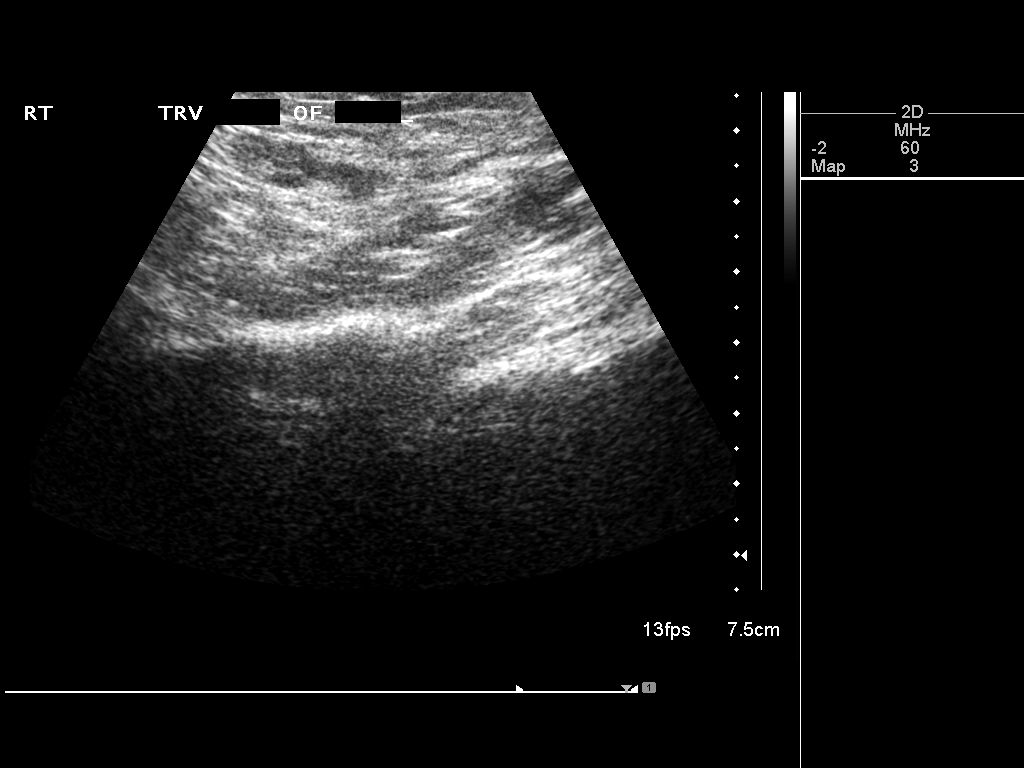

[6 of 6 positions shown; findings below may reference images not displayed]

FINDINGS: Ultrasound over the right groin was performed.  No solid
or cystic mass is seen.  No hernia is demonstrated.
IMPRESSION: Negative.

## 2014-09-12 DIAGNOSIS — H2510 Age-related nuclear cataract, unspecified eye: Secondary | ICD-10-CM | POA: Diagnosis not present

## 2014-09-25 DIAGNOSIS — E860 Dehydration: Secondary | ICD-10-CM | POA: Diagnosis not present

## 2014-09-27 ENCOUNTER — Encounter: Payer: Self-pay | Admitting: Physician Assistant

## 2014-09-27 ENCOUNTER — Encounter: Payer: Self-pay | Admitting: Internal Medicine

## 2014-09-27 ENCOUNTER — Ambulatory Visit (HOSPITAL_COMMUNITY)
Admission: RE | Admit: 2014-09-27 | Discharge: 2014-09-27 | Disposition: A | Discharge status: Home / Self Care | Payer: Medicare Other | Source: Ambulatory Visit | Attending: Physician Assistant | Admitting: Physician Assistant

## 2014-09-27 ENCOUNTER — Ambulatory Visit (INDEPENDENT_AMBULATORY_CARE_PROVIDER_SITE_OTHER): Payer: Medicare Other | Admitting: Physician Assistant

## 2014-09-27 VITALS — BP 106/64 | HR 68 | Temp 98.0°F | Resp 16 | Ht 70.0 in | Wt 147.0 lb

## 2014-09-27 DIAGNOSIS — R8290 Unspecified abnormal findings in urine: Secondary | ICD-10-CM | POA: Diagnosis not present

## 2014-09-27 DIAGNOSIS — N200 Calculus of kidney: Secondary | ICD-10-CM | POA: Diagnosis not present

## 2014-09-27 DIAGNOSIS — K5900 Constipation, unspecified: Secondary | ICD-10-CM | POA: Insufficient documentation

## 2014-09-27 DIAGNOSIS — Z23 Encounter for immunization: Secondary | ICD-10-CM | POA: Diagnosis not present

## 2014-09-27 DIAGNOSIS — K5909 Other constipation: Secondary | ICD-10-CM

## 2014-09-27 DIAGNOSIS — R1032 Left lower quadrant pain: Secondary | ICD-10-CM | POA: Diagnosis not present

## 2014-09-27 DIAGNOSIS — R8299 Other abnormal findings in urine: Secondary | ICD-10-CM | POA: Diagnosis not present

## 2014-09-27 DIAGNOSIS — K8020 Calculus of gallbladder without cholecystitis without obstruction: Secondary | ICD-10-CM | POA: Insufficient documentation

## 2014-09-27 LAB — CBC WITH DIFFERENTIAL/PLATELET
BASOS ABS: 0 10*3/uL (ref 0.0–0.1)
BASOS PCT: 0 % (ref 0–1)
EOS ABS: 0.4 10*3/uL (ref 0.0–0.7)
EOS PCT: 4 % (ref 0–5)
HCT: 43.1 % (ref 39.0–52.0)
Hemoglobin: 14.9 g/dL (ref 13.0–17.0)
Lymphocytes Relative: 18 % (ref 12–46)
Lymphs Abs: 1.9 10*3/uL (ref 0.7–4.0)
MCH: 30.5 pg (ref 26.0–34.0)
MCHC: 34.6 g/dL (ref 30.0–36.0)
MCV: 88.3 fL (ref 78.0–100.0)
Monocytes Absolute: 0.9 10*3/uL (ref 0.1–1.0)
Monocytes Relative: 9 % (ref 3–12)
Neutro Abs: 7.1 10*3/uL (ref 1.7–7.7)
Neutrophils Relative %: 69 % (ref 43–77)
PLATELETS: 267 10*3/uL (ref 150–400)
RBC: 4.88 MIL/uL (ref 4.22–5.81)
RDW: 13.8 % (ref 11.5–15.5)
WBC: 10.3 10*3/uL (ref 4.0–10.5)

## 2014-09-27 LAB — URINALYSIS, ROUTINE W REFLEX MICROSCOPIC
Bilirubin Urine: NEGATIVE
Glucose, UA: NEGATIVE mg/dL
HGB URINE DIPSTICK: NEGATIVE
Ketones, ur: NEGATIVE mg/dL
LEUKOCYTES UA: NEGATIVE
NITRITE: NEGATIVE
PROTEIN: NEGATIVE mg/dL
Specific Gravity, Urine: 1.009 (ref 1.005–1.030)
Urobilinogen, UA: 0.2 mg/dL (ref 0.0–1.0)
pH: 6.5 (ref 5.0–8.0)

## 2014-09-27 LAB — BASIC METABOLIC PANEL WITH GFR
BUN: 13 mg/dL (ref 6–23)
CALCIUM: 9.3 mg/dL (ref 8.4–10.5)
CO2: 29 mEq/L (ref 19–32)
CREATININE: 0.97 mg/dL (ref 0.50–1.35)
Chloride: 101 mEq/L (ref 96–112)
GFR, EST NON AFRICAN AMERICAN: 76 mL/min
GFR, Est African American: 87 mL/min
Glucose, Bld: 88 mg/dL (ref 70–99)
Potassium: 5.1 mEq/L (ref 3.5–5.3)
Sodium: 138 mEq/L (ref 135–145)

## 2014-09-27 MED ORDER — METRONIDAZOLE 500 MG PO TABS: 500.0000 mg | ORAL_TABLET | Freq: Two times a day (BID) | ORAL | Status: DC

## 2014-09-27 MED ORDER — CIPROFLOXACIN HCL 500 MG PO TABS: 500.0000 mg | ORAL_TABLET | Freq: Two times a day (BID) | ORAL | Status: AC

## 2014-09-27 NOTE — Progress Notes (Signed)
   Subjective:    Patient ID: Brian Proctor, male    DOB: Nov 11, 1936, 78 y.o.   MRN: [id number]  HPI 78 y.o. male with history of GERD, preDM, prostate cacncer, right inguinal hernia presents with AB pain. Pain started 1 week ago, achy pain, sharp at times, worse with change in position, intermittent, did have a low grade temperature/diaphoresis 2-3 days ago. Describes an episode of blowing leaves off driveway, went in ate a sandwich, felt exhausted, laid down with left lower AB pain and perfuse sweating, lasting 5-10 mins. Most recently he continue to have the pain and has been constipated, last BM 2 days ago and has been passing minimal gas.  Went to urgent care 2 days ago, found to have elevated WBC and dehydration.   Denies nausea, appetite change. Did play golf yesterday without problems.   Review of Systems  Constitutional: Positive for diaphoresis and fatigue. Negative for fever, chills, activity change, appetite change and unexpected weight change.  HENT: Negative.   Respiratory: Negative.   Cardiovascular: Negative.   Gastrointestinal: Positive for abdominal pain and constipation. Negative for nausea, vomiting, diarrhea, blood in stool, abdominal distention, anal bleeding and rectal pain.  Musculoskeletal: Negative.   Skin: Negative.        Objective:   Physical Exam  Constitutional: He is oriented to person, place, and time. He appears well-developed and well-nourished. No distress.  HENT:  Head: Normocephalic and atraumatic.  Neck: Normal range of motion. Neck supple.  Cardiovascular: Normal rate and regular rhythm.   No murmur heard. Pulmonary/Chest: Effort normal and breath sounds normal.  Abdominal: Soft. Normal appearance. He exhibits no abdominal bruit and no mass. Bowel sounds are decreased. There is tenderness (Tenderness on left side with palpation on the right) in the left lower quadrant. There is rebound and guarding. There is no rigidity, no CVA tenderness, no  tenderness at McBurney's point and negative Murphy's sign. No hernia.  Musculoskeletal: Normal range of motion.  Neurological: He is alert and oriented to person, place, and time. No cranial nerve deficit.  Skin: Skin is warm and dry. No rash noted. He is not diaphoretic.       Assessment & Plan:  Generalized pain, worse LLQ some mild rebound tenderness Will try to treat out patient, get labs Cipro/Flaygl, liquid diet/bland diet, continue fluids, KUB rule out obstruction acutely Pending labs will get CT AB rule out diverticulitis with complications, obstruction, etc Some concern with history of prostate cancer- check UA If worse pain, trouble urinating, vomiting please go to the ER

## 2014-09-27 NOTE — Patient Instructions (Signed)

## 2014-09-28 LAB — URINE CULTURE
Colony Count: NO GROWTH
Organism ID, Bacteria: NO GROWTH

## 2014-10-02 ENCOUNTER — Ambulatory Visit: Payer: Self-pay | Admitting: Physician Assistant

## 2014-10-09 ENCOUNTER — Ambulatory Visit: Payer: Self-pay | Admitting: Physician Assistant

## 2014-10-16 ENCOUNTER — Ambulatory Visit: Payer: Self-pay | Admitting: Physician Assistant

## 2014-10-16 ENCOUNTER — Encounter: Payer: Self-pay | Admitting: Internal Medicine

## 2014-10-16 ENCOUNTER — Ambulatory Visit (INDEPENDENT_AMBULATORY_CARE_PROVIDER_SITE_OTHER): Payer: Medicare Other | Admitting: Internal Medicine

## 2014-10-16 VITALS — BP 112/62 | HR 72 | Temp 97.7°F | Resp 16 | Ht 70.0 in | Wt 150.0 lb

## 2014-10-16 DIAGNOSIS — R7309 Other abnormal glucose: Secondary | ICD-10-CM

## 2014-10-16 DIAGNOSIS — I10 Essential (primary) hypertension: Secondary | ICD-10-CM

## 2014-10-16 DIAGNOSIS — E559 Vitamin D deficiency, unspecified: Secondary | ICD-10-CM

## 2014-10-16 DIAGNOSIS — E785 Hyperlipidemia, unspecified: Secondary | ICD-10-CM | POA: Diagnosis not present

## 2014-10-16 DIAGNOSIS — Z23 Encounter for immunization: Secondary | ICD-10-CM

## 2014-10-16 DIAGNOSIS — Z79899 Other long term (current) drug therapy: Secondary | ICD-10-CM | POA: Diagnosis not present

## 2014-10-16 DIAGNOSIS — R7303 Prediabetes: Secondary | ICD-10-CM

## 2014-10-16 LAB — CBC WITH DIFFERENTIAL/PLATELET
BASOS ABS: 0 10*3/uL (ref 0.0–0.1)
Basophils Relative: 0 % (ref 0–1)
EOS ABS: 0.4 10*3/uL (ref 0.0–0.7)
EOS PCT: 4 % (ref 0–5)
HCT: 43.8 % (ref 39.0–52.0)
Hemoglobin: 15.3 g/dL (ref 13.0–17.0)
Lymphocytes Relative: 26 % (ref 12–46)
Lymphs Abs: 2.6 10*3/uL (ref 0.7–4.0)
MCH: 30.6 pg (ref 26.0–34.0)
MCHC: 34.9 g/dL (ref 30.0–36.0)
MCV: 87.6 fL (ref 78.0–100.0)
Monocytes Absolute: 0.8 10*3/uL (ref 0.1–1.0)
Monocytes Relative: 8 % (ref 3–12)
NEUTROS PCT: 62 % (ref 43–77)
Neutro Abs: 6.1 10*3/uL (ref 1.7–7.7)
PLATELETS: 332 10*3/uL (ref 150–400)
RBC: 5 MIL/uL (ref 4.22–5.81)
RDW: 14.3 % (ref 11.5–15.5)
WBC: 9.9 10*3/uL (ref 4.0–10.5)

## 2014-10-16 NOTE — Progress Notes (Signed)
Patient ID: Brian Proctor, male   DOB: [birth year], 78 y.o.   MRN: [id number]   This very nice 78 y.o.male presents for 3 month follow up with Hypertension, Hyperlipidemia, Pre-Diabetes and Vitamin D Deficiency.    Patient is monitored expectantly for Labile HTN since 2005  & BP has been controlled at home. Today's BP: 112/62 mmHg. Patient has had no complaints of any cardiac type chest pain, palpitations, dyspnea/orthopnea/PND, dizziness, claudication, or dependent edema.   Hyperlipidemia is controlled with diet & meds. Patient denies myalgias or other med SE's. Last Lipids were not quite at goal -  Total Chol 174; HDL 41; LDL 116*; Trig 86 on 07/03/2014.   Also, the patient has history of  PreDiabetes (A1c 5.7% in 2013)  and he denies symptoms of reactive hypoglycemia, diabetic polys, paresthesias or visual blurring.  Last A1c was  5.5% on 07/03/2014.    Further, the patient also has history of Vitamin D Deficiency and supplements vitamin D without any suspected side-effects. Last vitamin D was  87 on 07/03/2014.   Medication List   aspirin 81 MG tablet  Take 81 mg by mouth daily.     atorvastatin 40 MG tablet  Commonly known as:  LIPITOR  Daily.     fish oil-omega-3 fatty acids 1000 MG capsule  Take 2 g by mouth daily.     fluorouracil 5 % cream  Commonly known as:  EFUDEX     hydrocortisone 2.5 % rectal cream  Commonly known as:  ANUSOL-HC  Place 1 application rectally 2 (two) times daily.     MULTIVITAMIN PO  Take by mouth daily.     VITAMIN C PO  Take by mouth daily.     VITAMIN D PO  Take by mouth daily.     No Known Allergies  PMHx:   Past Medical History  Diagnosis Date  . Inguinal hernia   . Hearing loss     wear hearing aid  . No pertinent past medical history   . Wears hearing aid     both ears  . Wears glasses   . Hyperlipidemia   . Hypertension   . Cancer     prostate - basal cell  . GERD (gastroesophageal reflux disease)   . Prediabetes   . Vitamin D  deficiency    Immunization History  Administered Date(s) Administered  . Influenza, High Dose Seasonal PF 09/27/2014  . Pneumococcal Conjugate-13 10/16/2014  . Pneumococcal-Unspecified 02/03/2011  . Td 07/26/2005  . Zoster 12/27/2006   Past Surgical History  Procedure Laterality Date  . Tonsillectomy      as a child  . Prostate surgery  1999 or 2000    due to cancer  . Colonoscopy      x2  . Inguinal hernia repair  01/15/2013    Procedure: HERNIA REPAIR INGUINAL ADULT;  Surgeon: Gwenyth Ober, MD;  Location: [facility];  Service: General;  Laterality: Right;   FHx:    Reviewed / unchanged  SHx:    Reviewed / unchanged  Systems Review:  Constitutional: Denies fever, chills, wt changes, headaches, insomnia, fatigue, night sweats, change in appetite. Eyes: Denies redness, blurred vision, diplopia, discharge, itchy, watery eyes.  ENT: Denies discharge, congestion, post nasal drip, epistaxis, sore throat, earache, hearing loss, dental pain, tinnitus, vertigo, sinus pain, snoring.  CV: Denies chest pain, palpitations, irregular heartbeat, syncope, dyspnea, diaphoresis, orthopnea, PND, claudication or edema. Respiratory: denies cough, dyspnea, DOE, pleurisy, hoarseness, laryngitis, wheezing.  Gastrointestinal:  Denies dysphagia, odynophagia, heartburn, reflux, water brash, abdominal pain or cramps, nausea, vomiting, bloating, diarrhea, constipation, hematemesis, melena, hematochezia  or hemorrhoids. Genitourinary: Denies dysuria, frequency, urgency, nocturia, hesitancy, discharge, hematuria or flank pain. Musculoskeletal: Denies arthralgias, myalgias, stiffness, jt. swelling, pain, limping or strain/sprain.  Skin: Denies pruritus, rash, hives, warts, acne, eczema or change in skin lesion(s). Neuro: No weakness, tremor, incoordination, spasms, paresthesia or pain. Psychiatric: Denies confusion, memory loss or sensory loss. Endo: Denies change in weight, skin or hair change.   Heme/Lymph: No excessive bleeding, bruising or enlarged lymph nodes.  Exam:  BP 112/62  Pulse 72  Temp 97.7 F   Resp 16  Ht 5\' 10"    Wt 150 lb  BMI 21.52   Appears well nourished and in no distress. Eyes: PERRLA, EOMs, conjunctiva no swelling or erythema. Sinuses: No frontal/maxillary tenderness ENT/Mouth: EAC's clear, TM's nl w/o erythema, bulging. Nares clear w/o erythema, swelling, exudates. Oropharynx clear without erythema or exudates. Oral hygiene is good. Tongue normal, non obstructing. Hearing intact.  Neck: Supple. Thyroid nl. Car 2+/2+ without bruits, nodes or JVD. Chest: Respirations nl with BS clear & equal w/o rales, rhonchi, wheezing or stridor.  Cor: Heart sounds normal w/ regular rate and rhythm without sig. murmurs, gallops, clicks, or rubs. Peripheral pulses normal and equal  without edema.  Abdomen: Soft & bowel sounds normal. Non-tender w/o guarding, rebound, hernias, masses, or organomegaly.  Lymphatics: Unremarkable.  Musculoskeletal: Full ROM all peripheral extremities, joint stability, 5/5 strength, and normal gait.  Skin: Warm, dry without exposed rashes, lesions or ecchymosis apparent.  Neuro: Cranial nerves intact, reflexes equal bilaterally. Sensory-motor testing grossly intact. Tendon reflexes grossly intact.  Pysch: Alert & oriented x 3.  Insight and judgement nl & appropriate. No ideations.  Assessment and Plan:  1. Hypertension, Labile  - Continue monitor blood pressure at home. Continue diet/exercise same.  2. Hyperlipidemia - Continue diet/meds, exercise,& lifestyle modifications. Continue monitor quarterly cholesterol / liver & renal functions   3.  Pre-Diabetes - Continue diet, exercise, lifestyle modifications. Monitor appropriate labs.  4. Vitamin D Deficiency - Continue supplementation.   Recommended regular exercise, BP monitoring, weight control, and discussed med and SE's. Recommended labs to assess and monitor clinical status. Further  disposition pending results of labs.

## 2014-10-16 NOTE — Patient Instructions (Signed)

## 2014-10-17 LAB — INSULIN, FASTING: INSULIN FASTING, SERUM: 5.5 u[IU]/mL (ref 2.0–19.6)

## 2014-10-17 LAB — LIPID PANEL
CHOLESTEROL: 189 mg/dL (ref 0–200)
HDL: 45 mg/dL (ref >39)
LDL Cholesterol: 103 mg/dL — ABNORMAL HIGH (ref 0–99)
TRIGLYCERIDES: 206 mg/dL — AB (ref <150)
Total CHOL/HDL Ratio: 4.2 Ratio
VLDL: 41 mg/dL — ABNORMAL HIGH (ref 0–40)

## 2014-10-17 LAB — BASIC METABOLIC PANEL WITH GFR
BUN: 13 mg/dL (ref 6–23)
CALCIUM: 9.2 mg/dL (ref 8.4–10.5)
CO2: 28 mEq/L (ref 19–32)
CREATININE: 0.92 mg/dL (ref 0.50–1.35)
Chloride: 103 mEq/L (ref 96–112)
GFR, Est Non African American: 81 mL/min
Glucose, Bld: 68 mg/dL — ABNORMAL LOW (ref 70–99)
Potassium: 5.1 mEq/L (ref 3.5–5.3)
Sodium: 140 mEq/L (ref 135–145)

## 2014-10-17 LAB — HEPATIC FUNCTION PANEL
ALBUMIN: 3.9 g/dL (ref 3.5–5.2)
ALT: 28 U/L (ref 0–53)
AST: 39 U/L — ABNORMAL HIGH (ref 0–37)
Alkaline Phosphatase: 58 U/L (ref 39–117)
BILIRUBIN TOTAL: 0.5 mg/dL (ref 0.2–1.2)
Bilirubin, Direct: 0.1 mg/dL (ref 0.0–0.3)
Indirect Bilirubin: 0.4 mg/dL (ref 0.2–1.2)
Total Protein: 6.8 g/dL (ref 6.0–8.3)

## 2014-10-17 LAB — HEMOGLOBIN A1C
Hgb A1c MFr Bld: 5.6 % (ref <5.7)
Mean Plasma Glucose: 114 mg/dL (ref <117)

## 2014-10-17 LAB — VITAMIN D 25 HYDROXY (VIT D DEFICIENCY, FRACTURES): VIT D 25 HYDROXY: 81 ng/mL (ref 30–89)

## 2014-10-17 LAB — MAGNESIUM: MAGNESIUM: 2.3 mg/dL (ref 1.5–2.5)

## 2014-10-17 LAB — TSH: TSH: 4.019 u[IU]/mL (ref 0.350–4.500)

## 2014-10-22 ENCOUNTER — Other Ambulatory Visit: Payer: Self-pay | Admitting: *Deleted

## 2014-10-22 MED ORDER — ATORVASTATIN CALCIUM 40 MG PO TABS: 40.0000 mg | ORAL_TABLET | Freq: Every day | ORAL | Status: DC

## 2014-10-23 ENCOUNTER — Ambulatory Visit: Payer: Self-pay | Admitting: Internal Medicine

## 2014-10-28 DIAGNOSIS — K5732 Diverticulitis of large intestine without perforation or abscess without bleeding: Secondary | ICD-10-CM | POA: Diagnosis not present

## 2014-12-12 DIAGNOSIS — Z85828 Personal history of other malignant neoplasm of skin: Secondary | ICD-10-CM | POA: Diagnosis not present

## 2014-12-12 DIAGNOSIS — C44319 Basal cell carcinoma of skin of other parts of face: Secondary | ICD-10-CM | POA: Diagnosis not present

## 2014-12-12 DIAGNOSIS — D485 Neoplasm of uncertain behavior of skin: Secondary | ICD-10-CM | POA: Diagnosis not present

## 2014-12-12 DIAGNOSIS — C44229 Squamous cell carcinoma of skin of left ear and external auricular canal: Secondary | ICD-10-CM | POA: Diagnosis not present

## 2014-12-30 ENCOUNTER — Other Ambulatory Visit: Payer: Self-pay | Admitting: Physician Assistant

## 2015-01-14 ENCOUNTER — Ambulatory Visit: Payer: Self-pay | Admitting: Internal Medicine

## 2015-01-15 ENCOUNTER — Ambulatory Visit: Payer: Self-pay | Admitting: Internal Medicine

## 2015-01-28 ENCOUNTER — Encounter: Payer: Self-pay | Admitting: Physician Assistant

## 2015-01-28 ENCOUNTER — Ambulatory Visit (INDEPENDENT_AMBULATORY_CARE_PROVIDER_SITE_OTHER): Payer: Medicare Other | Admitting: Physician Assistant

## 2015-01-28 VITALS — BP 102/60 | HR 76 | Temp 97.7°F | Resp 16 | Ht 70.0 in | Wt 153.0 lb

## 2015-01-28 DIAGNOSIS — E785 Hyperlipidemia, unspecified: Secondary | ICD-10-CM | POA: Diagnosis not present

## 2015-01-28 DIAGNOSIS — Z79899 Other long term (current) drug therapy: Secondary | ICD-10-CM

## 2015-01-28 DIAGNOSIS — I10 Essential (primary) hypertension: Secondary | ICD-10-CM | POA: Diagnosis not present

## 2015-01-28 DIAGNOSIS — R7309 Other abnormal glucose: Secondary | ICD-10-CM

## 2015-01-28 DIAGNOSIS — J0100 Acute maxillary sinusitis, unspecified: Secondary | ICD-10-CM

## 2015-01-28 DIAGNOSIS — R7303 Prediabetes: Secondary | ICD-10-CM

## 2015-01-28 DIAGNOSIS — E559 Vitamin D deficiency, unspecified: Secondary | ICD-10-CM | POA: Diagnosis not present

## 2015-01-28 LAB — BASIC METABOLIC PANEL WITH GFR
BUN: 14 mg/dL (ref 6–23)
CO2: 31 mEq/L (ref 19–32)
CREATININE: 0.94 mg/dL (ref 0.50–1.35)
Calcium: 9.4 mg/dL (ref 8.4–10.5)
Chloride: 105 mEq/L (ref 96–112)
GFR, EST NON AFRICAN AMERICAN: 78 mL/min
GFR, Est African American: >89 mL/min
GLUCOSE: 86 mg/dL (ref 70–99)
Potassium: 5.1 mEq/L (ref 3.5–5.3)
SODIUM: 141 meq/L (ref 135–145)

## 2015-01-28 LAB — HEPATIC FUNCTION PANEL
ALT: 18 U/L (ref 0–53)
AST: 22 U/L (ref 0–37)
Albumin: 3.9 g/dL (ref 3.5–5.2)
Alkaline Phosphatase: 86 U/L (ref 39–117)
BILIRUBIN DIRECT: 0.1 mg/dL (ref 0.0–0.3)
BILIRUBIN TOTAL: 0.6 mg/dL (ref 0.2–1.2)
Indirect Bilirubin: 0.5 mg/dL (ref 0.2–1.2)
Total Protein: 6.9 g/dL (ref 6.0–8.3)

## 2015-01-28 LAB — CBC WITH DIFFERENTIAL/PLATELET
BASOS PCT: 0 % (ref 0–1)
Basophils Absolute: 0 10*3/uL (ref 0.0–0.1)
EOS PCT: 2 % (ref 0–5)
Eosinophils Absolute: 0.3 10*3/uL (ref 0.0–0.7)
HCT: 45.9 % (ref 39.0–52.0)
HEMOGLOBIN: 15.6 g/dL (ref 13.0–17.0)
Lymphocytes Relative: 15 % (ref 12–46)
Lymphs Abs: 2 10*3/uL (ref 0.7–4.0)
MCH: 30.6 pg (ref 26.0–34.0)
MCHC: 34 g/dL (ref 30.0–36.0)
MCV: 90.2 fL (ref 78.0–100.0)
MONO ABS: 1.2 10*3/uL — AB (ref 0.1–1.0)
MONOS PCT: 9 % (ref 3–12)
MPV: 10.1 fL (ref 8.6–12.4)
NEUTROS PCT: 74 % (ref 43–77)
Neutro Abs: 9.8 10*3/uL — ABNORMAL HIGH (ref 1.7–7.7)
Platelets: 285 10*3/uL (ref 150–400)
RBC: 5.09 MIL/uL (ref 4.22–5.81)
RDW: 13.3 % (ref 11.5–15.5)
WBC: 13.2 10*3/uL — AB (ref 4.0–10.5)

## 2015-01-28 LAB — LIPID PANEL
Cholesterol: 136 mg/dL (ref 0–200)
HDL: 40 mg/dL (ref >39)
LDL Cholesterol: 64 mg/dL (ref 0–99)
Total CHOL/HDL Ratio: 3.4 Ratio
Triglycerides: 162 mg/dL — ABNORMAL HIGH (ref <150)
VLDL: 32 mg/dL (ref 0–40)

## 2015-01-28 LAB — MAGNESIUM: MAGNESIUM: 2.2 mg/dL (ref 1.5–2.5)

## 2015-01-28 MED ORDER — AZITHROMYCIN 250 MG PO TABS: ORAL_TABLET | ORAL | Status: DC

## 2015-01-28 MED ORDER — HYDROCORTISONE 2.5 % RE CREA: TOPICAL_CREAM | RECTAL | Status: DC

## 2015-01-28 NOTE — Patient Instructions (Signed)
Benefiber is good for constipation/diarrhea/irritable bowel syndrome, it helps with weight loss and can help lower your bad cholesterol. Please do 1-2 TBSP in the morning in water, coffee, or tea. It can take up to a month before you can see a difference with your bowel movements. It is cheapest from [employer], [employer], [employer].   Your LDL is not in range. Your LDL is the bad cholesterol that can lead to heart attack and stroke. To lower your number you can decrease your fatty foods, red meat, cheese, milk and increase fiber like whole grains and veggies. You can also add a fiber supplement like Metamucil or Benefiber.   HOW TO TREAT VIRAL COUGH AND COLD SYMPTOMS:  -Symptoms usually last at least 1 week with the worst symptoms being around day 4.  - colds usually start with a sore throat and end with a cough, and the cough can take 2 weeks to get better.  -No antibiotics are needed for colds, flu, sore throats, cough, bronchitis UNLESS symptoms are longer than 7 days OR if you are getting better then get drastically worse.  -There are a lot of combination medications (Dayquil, Nyquil, Vicks 44, tyelnol cold and sinus, ETC). Please look at the ingredients on the back so that you are treating the correct symptoms and not doubling up on medications/ingredients.    Medicines you can use  Nasal congestion  - pseudoephedrine (Sudafed)- behind the counter, do not use if you have high blood pressure, medicine that have -D in them.  - phenylephrine (Sudafed PE) -Dextormethorphan + chlorpheniramine (Coridcidin HBP)- okay if you have high blood pressure -Oxymetazoline (Afrin) nasal spray- LIMIT to 3 days -Saline nasal spray -Neti pot (used distilled or bottled water)  Ear pain/congestion  -pseudoephedrine (sudafed) - Nasonex/flonase nasal spray  Fever  -Acetaminophen (Tyelnol) -Ibuprofen (Advil, motrin, aleve)  Sore Throat  -Acetaminophen (Tyelnol) -Ibuprofen (Advil, motrin, aleve) -Drink a lot of  water -Gargle with salt water - Rest your voice (don't talk) -Throat sprays -Cough drops  Body Aches  -Acetaminophen (Tyelnol) -Ibuprofen (Advil, motrin, aleve)  Headache  -Acetaminophen (Tyelnol) -Ibuprofen (Advil, motrin, aleve) - Exedrin, Exedrin Migraine  Allergy symptoms (cough, sneeze, runny nose, itchy eyes) -Claritin or loratadine cheapest but likely the weakest  -Zyrtec or certizine at night because it can make you sleepy -The strongest is allegra or fexafinadine  Cheapest at [employer], [employer], [employer]  Cough  -Dextromethorphan (Delsym)- medicine that has DM in it -Guafenesin (Mucinex/Robitussin) - cough drops - drink lots of water  Chest Congestion  -Guafenesin (Mucinex/Robitussin)  Red Itchy Eyes  - Naphcon-A  Upset Stomach  - Bland diet (nothing spicy, greasy, fried, and high acid foods like tomatoes, oranges, berries) -OKAY- cereal, bread, soup, crackers, rice -Eat smaller more frequent meals -reduce caffeine, no alcohol -Loperamide (Imodium-AD) if diarrhea -Prevacid for heart burn  General health when sick  -Hydration -wash your hands frequently -keep surfaces clean -change pillow cases and sheets often -Get fresh air but do not exercise strenuously -Vitamin D, double up on it - Vitamin C -Zinc

## 2015-01-28 NOTE — Progress Notes (Signed)
Assessment and Plan:  Hypertension: Continue medication, monitor blood pressure at home. Continue DASH diet.  Reminder to go to the ER if any CP, SOB, nausea, dizziness, severe HA, changes vision/speech, left arm numbness and tingling, and jaw pain. Cholesterol: Continue diet and exercise. Check cholesterol. Vitamin D Def- check level and continue medications.  URI- Discussed the diagnosis and treatment of sinusitis. Suggested symptomatic OTC remedies. Nasal saline spray for congestion. Zithromax per orders. Call in 7 days if symptoms aren't resolving.   Continue diet and meds as discussed. Further disposition pending results of labs.  HPI 79 y.o. male  presents for 3 month follow up with hypertension, hyperlipidemia, prediabetes and vitamin D.  His blood pressure has been controlled at home, today their BP is BP: 102/60 mmHg  He does workout. He denies chest pain, shortness of breath, dizziness.  He is on cholesterol medication, lipitor 1/2 pill every day and denies myalgias. His cholesterol is not at goal. The cholesterol last visit was:   Lab Results  Component Value Date   CHOL 189 10/16/2014   HDL 45 10/16/2014   LDLCALC 103* 10/16/2014   TRIG 206* 10/16/2014   CHOLHDL 4.2 10/16/2014  Last A1C in the office was:  Lab Results  Component Value Date   HGBA1C 5.6 10/16/2014  Patient is on Vitamin D supplement, 5000 IU a day.   Lab Results  Component Value Date   VD25OH 81 10/16/2014  Patient also complains of possible sinusitis. Symptoms include bilateral ear pressure/pain, nasal congestion, no  fever, post nasal drip, productive cough with  gray colored sputum, sinus pressure, sore throat and wheezing. Onset of symptoms was 2 weeks ago, and has been gradually worsening since that time. Treatment to date: mucinex and coricidin HPB.     Current Medications:  Current Outpatient Prescriptions on File Prior to Visit  Medication Sig Dispense Refill  . Ascorbic Acid (VITAMIN C PO)  Take by mouth daily.    [name] aspirin 81 MG tablet Take 81 mg by mouth daily.    [name] atorvastatin (LIPITOR) 40 MG tablet Take 1 tablet (40 mg total) by mouth daily. 90 tablet 1  . Cholecalciferol (VITAMIN D PO) Take by mouth daily.    . fish oil-omega-3 fatty acids 1000 MG capsule Take 2 g by mouth daily.    . fluorouracil (EFUDEX) 5 % cream     . Multiple Vitamins-Minerals (MULTIVITAMIN PO) Take by mouth daily.    [name] PROCTOZONE-HC 2.5 % rectal cream APPLY RECTALLY TWICE DAILY AS DIRECTED 30 g 0   No current facility-administered medications on file prior to visit.   Medical History:  Past Medical History  Diagnosis Date  . Inguinal hernia   . Hearing loss     wear hearing aid  . No pertinent past medical history   . Wears hearing aid     both ears  . Wears glasses   . Hyperlipidemia   . Hypertension   . Cancer     prostate - basal cell  . GERD (gastroesophageal reflux disease)   . Prediabetes   . Vitamin D deficiency    Allergies: No Known Allergies   Review of Systems:  Review of Systems  Constitutional: Negative for fever, chills, weight loss, malaise/fatigue and diaphoresis.  HENT: Positive for congestion, ear pain and sore throat. Negative for ear discharge, hearing loss, nosebleeds and tinnitus.   Eyes: Negative.   Respiratory: Positive for cough, sputum production and wheezing. Negative for shortness of breath and stridor.  Cardiovascular: Negative.  Negative for chest pain, leg swelling and PND.  Gastrointestinal: Negative.   Genitourinary: Negative.     Family history- Review and unchanged Social history- Review and unchanged Physical Exam: BP 102/60 mmHg  Pulse 76  Temp(Src) 97.7 F (36.5 C)  Resp 16  Ht 5\' 10"  (1.778 m)  Wt 153 lb (69.4 kg)  BMI 21.95 kg/m2 Wt Readings from Last 3 Encounters:  01/28/15 153 lb (69.4 kg)  10/16/14 150 lb (68.04 kg)  09/27/14 147 lb (66.679 kg)   General Appearance: Well nourished, in no apparent distress. Eyes: PERRLA,  EOMs, conjunctiva no swelling or erythema Sinuses: + Frontal/maxillary tenderness ENT/Mouth: Ext aud canals clear, TMs without erythema, bulging. No erythema, swelling, or exudate on post pharynx.  Tonsils not swollen or erythematous. Hearing normal.  Neck: Supple, thyroid normal.  Respiratory: Respiratory effort normal, BS equal bilaterally with mild diffuse wheezing without rales, rhonchi, or stridor.  Cardio: RRR with no MRGs. Brisk peripheral pulses without edema.  Abdomen: Soft, + BS,  Non tender, no guarding, rebound, hernias, masses. Lymphatics: Non tender without lymphadenopathy.  Musculoskeletal: Full ROM, 5/5 strength, Normal gait Skin: Warm, dry without rashes, lesions, ecchymosis.  Neuro: Cranial nerves intact. Normal muscle tone, no cerebellar symptoms. Psych: Awake and oriented X 3, normal affect, Insight and Judgment appropriate.    Vicie Mutters, PA-C 1:36 PM [facility]

## 2015-01-29 LAB — TSH: TSH: 2.364 u[IU]/mL (ref 0.350–4.500)

## 2015-02-03 ENCOUNTER — Ambulatory Visit: Payer: Self-pay | Admitting: Internal Medicine

## 2015-02-11 ENCOUNTER — Ambulatory Visit: Payer: Self-pay | Admitting: Physician Assistant

## 2015-02-12 ENCOUNTER — Encounter: Payer: Self-pay | Admitting: Internal Medicine

## 2015-02-17 DIAGNOSIS — L570 Actinic keratosis: Secondary | ICD-10-CM | POA: Diagnosis not present

## 2015-02-17 DIAGNOSIS — L821 Other seborrheic keratosis: Secondary | ICD-10-CM | POA: Diagnosis not present

## 2015-02-17 DIAGNOSIS — D044 Carcinoma in situ of skin of scalp and neck: Secondary | ICD-10-CM | POA: Diagnosis not present

## 2015-02-17 DIAGNOSIS — D485 Neoplasm of uncertain behavior of skin: Secondary | ICD-10-CM | POA: Diagnosis not present

## 2015-02-17 DIAGNOSIS — Z85828 Personal history of other malignant neoplasm of skin: Secondary | ICD-10-CM | POA: Diagnosis not present

## 2015-03-03 ENCOUNTER — Encounter: Payer: Self-pay | Admitting: Physician Assistant

## 2015-03-31 ENCOUNTER — Other Ambulatory Visit: Payer: Self-pay | Admitting: Physician Assistant

## 2015-04-17 ENCOUNTER — Encounter: Payer: Self-pay | Admitting: Internal Medicine

## 2015-04-23 ENCOUNTER — Encounter: Payer: Self-pay | Admitting: Internal Medicine

## 2015-05-12 ENCOUNTER — Encounter: Payer: Self-pay | Admitting: Internal Medicine

## 2015-05-19 ENCOUNTER — Encounter: Payer: Self-pay | Admitting: Internal Medicine

## 2015-05-19 ENCOUNTER — Ambulatory Visit (INDEPENDENT_AMBULATORY_CARE_PROVIDER_SITE_OTHER): Payer: Medicare Other | Admitting: Internal Medicine

## 2015-05-19 VITALS — BP 108/66 | HR 72 | Temp 98.0°F | Resp 16 | Ht 70.0 in | Wt 151.0 lb

## 2015-05-19 DIAGNOSIS — E559 Vitamin D deficiency, unspecified: Secondary | ICD-10-CM

## 2015-05-19 DIAGNOSIS — C61 Malignant neoplasm of prostate: Secondary | ICD-10-CM

## 2015-05-19 DIAGNOSIS — Z1212 Encounter for screening for malignant neoplasm of rectum: Secondary | ICD-10-CM

## 2015-05-19 DIAGNOSIS — R7309 Other abnormal glucose: Secondary | ICD-10-CM

## 2015-05-19 DIAGNOSIS — I10 Essential (primary) hypertension: Secondary | ICD-10-CM

## 2015-05-19 DIAGNOSIS — K219 Gastro-esophageal reflux disease without esophagitis: Secondary | ICD-10-CM

## 2015-05-19 DIAGNOSIS — Z125 Encounter for screening for malignant neoplasm of prostate: Secondary | ICD-10-CM | POA: Diagnosis not present

## 2015-05-19 DIAGNOSIS — Z79899 Other long term (current) drug therapy: Secondary | ICD-10-CM | POA: Diagnosis not present

## 2015-05-19 DIAGNOSIS — R7303 Prediabetes: Secondary | ICD-10-CM

## 2015-05-19 DIAGNOSIS — E785 Hyperlipidemia, unspecified: Secondary | ICD-10-CM

## 2015-05-19 DIAGNOSIS — Z1331 Encounter for screening for depression: Secondary | ICD-10-CM

## 2015-05-19 DIAGNOSIS — R6889 Other general symptoms and signs: Secondary | ICD-10-CM | POA: Diagnosis not present

## 2015-05-19 DIAGNOSIS — Z9181 History of falling: Secondary | ICD-10-CM

## 2015-05-19 DIAGNOSIS — Z1211 Encounter for screening for malignant neoplasm of colon: Secondary | ICD-10-CM

## 2015-05-19 DIAGNOSIS — Z0001 Encounter for general adult medical examination with abnormal findings: Secondary | ICD-10-CM | POA: Diagnosis not present

## 2015-05-19 DIAGNOSIS — Z0000 Encounter for general adult medical examination without abnormal findings: Secondary | ICD-10-CM

## 2015-05-19 LAB — CBC WITH DIFFERENTIAL/PLATELET
BASOS ABS: 0.1 10*3/uL (ref 0.0–0.1)
Basophils Relative: 1 % (ref 0–1)
Eosinophils Absolute: 0.5 10*3/uL (ref 0.0–0.7)
Eosinophils Relative: 5 % (ref 0–5)
HCT: 46.3 % (ref 39.0–52.0)
HEMOGLOBIN: 15.4 g/dL (ref 13.0–17.0)
LYMPHS ABS: 2.2 10*3/uL (ref 0.7–4.0)
Lymphocytes Relative: 23 % (ref 12–46)
MCH: 30.1 pg (ref 26.0–34.0)
MCHC: 33.3 g/dL (ref 30.0–36.0)
MCV: 90.6 fL (ref 78.0–100.0)
MONOS PCT: 7 % (ref 3–12)
MPV: 10.4 fL (ref 8.6–12.4)
Monocytes Absolute: 0.7 10*3/uL (ref 0.1–1.0)
Neutro Abs: 6.2 10*3/uL (ref 1.7–7.7)
Neutrophils Relative %: 64 % (ref 43–77)
Platelets: 254 10*3/uL (ref 150–400)
RBC: 5.11 MIL/uL (ref 4.22–5.81)
RDW: 14 % (ref 11.5–15.5)
WBC: 9.7 10*3/uL (ref 4.0–10.5)

## 2015-05-19 LAB — BASIC METABOLIC PANEL WITH GFR
BUN: 17 mg/dL (ref 6–23)
CO2: 26 mEq/L (ref 19–32)
Calcium: 9.3 mg/dL (ref 8.4–10.5)
Chloride: 103 mEq/L (ref 96–112)
Creat: 0.94 mg/dL (ref 0.50–1.35)
GFR, Est African American: >89 mL/min
GFR, Est Non African American: 78 mL/min
GLUCOSE: 86 mg/dL (ref 70–99)
Potassium: 4.5 mEq/L (ref 3.5–5.3)
SODIUM: 141 meq/L (ref 135–145)

## 2015-05-19 LAB — HEPATIC FUNCTION PANEL
ALT: 26 U/L (ref 0–53)
AST: 30 U/L (ref 0–37)
Albumin: 4.2 g/dL (ref 3.5–5.2)
Alkaline Phosphatase: 71 U/L (ref 39–117)
BILIRUBIN DIRECT: 0.1 mg/dL (ref 0.0–0.3)
Indirect Bilirubin: 0.4 mg/dL (ref 0.2–1.2)
TOTAL PROTEIN: 6.9 g/dL (ref 6.0–8.3)
Total Bilirubin: 0.5 mg/dL (ref 0.2–1.2)

## 2015-05-19 LAB — MAGNESIUM: MAGNESIUM: 2.2 mg/dL (ref 1.5–2.5)

## 2015-05-19 LAB — LIPID PANEL
Cholesterol: 181 mg/dL (ref 0–200)
HDL: 44 mg/dL (ref >=40)
LDL Cholesterol: 109 mg/dL — ABNORMAL HIGH (ref 0–99)
TRIGLYCERIDES: 139 mg/dL (ref <150)
Total CHOL/HDL Ratio: 4.1 Ratio
VLDL: 28 mg/dL (ref 0–40)

## 2015-05-19 LAB — TSH: TSH: 3.242 u[IU]/mL (ref 0.350–4.500)

## 2015-05-19 NOTE — Patient Instructions (Signed)

## 2015-05-19 NOTE — Progress Notes (Signed)
Patient ID: Brian Proctor, male   DOB: 03/06/1936, 79 y.o.   MRN: [id number]  [facility] VISIT AND CPE  Assessment:   1. Essential hypertension  - TSH  2. Hyperlipidemia  - Lipid Profile  3. Prediabetes  - Hemoglobin A1c ([facility]) - Insulin, random  4. Vitamin D deficiency  - Vitamin D (25 hydroxy)  5. Gastroesophageal reflux disease, esophagitis presence not specified   6. Prostate cancer  - PSA  7. Screening for prostate cancer  - PSA  8. Screening for colorectal cancer   9. Depression screen   10. At low risk for fall   11. Medication management  - BMP with Estimated GFR ([id number]) - CBC with Diff - Urinalysis, Reflex Microscopic - Magnesium - Liver Profile  12. Routine general medical examination at a [facility] care facility  Plan:   During the course of the visit the patient was educated and counseled about appropriate screening and preventive services including:    Pneumococcal vaccine   Influenza vaccine  Td vaccine  Screening electrocardiogram  Bone densitometry screening  Colorectal cancer screening  Diabetes screening  Glaucoma screening  Nutrition counseling   Advanced directives: requested  Screening recommendations, referrals: Vaccinations:  Immunization History  Administered Date(s) Administered  . Influenza, High Dose Seasonal PF 09/27/2014  . Pneumococcal Conjugate-13 10/16/2014  . Pneumococcal-Unspecified 02/03/2011  . Td 07/26/2005  . Zoster 12/27/2006  Hep B vaccine not indicated  Nutrition assessed and recommended  Colonoscopy May 2013 Negative - Dr Brian Proctor.  Recommended yearly ophthalmology/optometry visit for glaucoma screening and checkup Recommended yearly dental visit for hygiene and checkup Advanced directives - yes  Conditions/risks identified: BMI: Discussed weight loss, diet, and increase physical activity.  Increase physical activity: AHA recommends 150 minutes of physical  activity a week.  Medications reviewed PreDiabetes is not at goal, ACE/ARB therapy: Not Indicated Urinary Incontinence is not an issue: discussed non pharmacology and pharmacology options.  Fall risk: low- discussed PT, home fall assessment, medications.   Subjective:    Brian Proctor presents for [employer] Visit and complete physical.  Date of last medicare wellness visit was 03/04/ 2014.  [name] for  follow up with Hypertension, Hyperlipidemia, Pre-Diabetes and Vitamin D Deficiency.    Patient is treated for HTN since 2005 & BP has been controlled at home. Today's BP: 108/66 mmHg. Patient has had no complaints of any cardiac type chest pain, palpitations, dyspnea/orthopnea/PND, dizziness, claudication, or dependent edema. He does exercise by walking, golfing, and does yard and house work.    Hyperlipidemia is controlled with diet & meds. Patient denies myalgias or other med SE's. Last Lipids were Total Chol 136; HDL 40; LDL  64; Trig 162 on 01/28/2015:   Also, the patient has history of PreDiabetes with A1c 5.7% in 2013 and 5.3% in 2014 and has had no symptoms of reactive hypoglycemia, diabetic polys, paresthesias or visual blurring.  Last A1c was 5.6% on 10/16/2014.   Further, the patient also has history of Vitamin D Deficiency 49 in 2008 on supplements and he still supplements vitamin D without any suspected side-effects. Last vitamin D was  81 on 10/16/2014.   Names of Other Physician/Practitioners you currently use: 1. [facility] here for primary care 2. Brian Proctor - different practioners, eye doctor, last visit 2015 3. Dentist @ [facility] in [place] , and has 6 monthly cleanings  Patient Care Team: Brian Pinto, MD  as PCP - General (Internal Medicine)  Medication Review: Medication Sig  . Ascorbic Acid (VITAMIN C PO) Take by mouth daily.  [name] aspirin 81 MG tablet Take 81 mg by  mouth daily.  [name] atorvastatin (LIPITOR) 40 MG tablet Take 1 tablet (40 mg total) by mouth daily.  . Cholecalciferol (VITAMIN D PO) Take by mouth daily.  . fish oil-omega-3 fatty acids 1000 MG capsule Take 2 g by mouth daily.  . fluorouracil (EFUDEX) 5 % cream   . Multiple Vitamins-Minerals (MULTIVITAMIN PO) Take by mouth daily.  [name] PROCTOZONE-HC 2.5 % rectal cream APPLY RECTALLY TWICE DAILY AS DIRECTED   Current Problems (verified) Patient Active Problem List   Diagnosis Date Noted  . Medication management 07/03/2014  . Prostate cancer 07/03/2014  . Hyperlipidemia   . Hypertension   . GERD (gastroesophageal reflux disease)   . Prediabetes   . Vitamin D deficiency    Screening Tests [facility] Maintenance  Topic Date Due  . TETANUS/TDAP  07/27/2015  . INFLUENZA VACCINE  07/28/2015  . COLONOSCOPY  05/09/2022  . ZOSTAVAX  Completed  . PNA vac Low Risk Adult  Completed   Immunization History  Administered Date(s) Administered  . Influenza, High Dose Seasonal PF 09/27/2014  . Pneumococcal Conjugate-13 10/16/2014  . Pneumococcal-Unspecified 02/03/2011  . Td 07/26/2005  . Zoster 12/27/2006   Preventative care: Last colonoscopy: 04/2012 Negative - Dr Brian Proctor.  History reviewed: allergies, current medications, past family history, past medical history, past social history, past surgical history and problem list  Risk Factors: Tobacco History  Substance Use Topics  . Smoking status: Former Smoker    Quit date: 01/10/1999  . Smokeless tobacco: Never Used  . Alcohol Use: No     Comment: occasional   He does not smoke.  Patient is a former smoker. Are there smokers in your home (other than you)?  No Alcohol Current alcohol use: very rare social  1-2 x /yr  Caffeine Current caffeine use: coffee 2 cups /day  Exercise Current exercise: housecleaning, walking and yard work  Nutrition/Diet Current diet: in general, a "healthy" diet    Cardiac risk factors: advanced  age (older than 77 for men, 24 for women), dyslipidemia and hypertension.  Depression Screen (Note: if answer to either of the following is "Yes", a more complete depression screening is indicated)   Q1: Over the past two weeks, have you felt down, depressed or hopeless? No  Q2: Over the past two weeks, have you felt little interest or pleasure in doing things? No  Have you lost interest or pleasure in daily life? No  Do you often feel hopeless? No  Do you cry easily over simple problems? No  Activities of Daily Living In your present state of health, do you have any difficulty performing the following activities?:  Driving? No Managing money?  No Feeding yourself? No Getting from bed to chair? No Climbing a flight of stairs? No Preparing food and eating?: No Bathing or showering? No Getting dressed: No Getting to the toilet? No Using the toilet:No Moving around from place to place: No In the past year have you fallen or had a near fall?:No   Are you sexually active?  No  Do you have more than one partner?  No  Vision Difficulties: No  Hearing Difficulties: No Do you often ask people to speak up or repeat themselves? No Do you experience ringing or noises in your ears? No Do you have difficulty understanding soft or whispered  voices? Sometimes.  Cognition  Do you feel that you have a problem with memory?No  Do you often misplace items? No  Do you feel safe at home?  Yes  Advanced directives Does patient have a [place]? Yes Does patient have a Living Will? Yes  Past Medical History  Diagnosis Date  . Inguinal hernia   . Hearing loss     wear hearing aid  . No pertinent past medical history   . Wears hearing aid     both ears  . Wears glasses   . Hyperlipidemia   . Hypertension   . Cancer     prostate - basal cell  . GERD (gastroesophageal reflux disease)   . Prediabetes   . Vitamin D deficiency    Past Surgical History  Procedure  Laterality Date  . Tonsillectomy      as a child  . Prostate surgery  1999 or 2000    due to cancer  . Colonoscopy      x2  . Inguinal hernia repair  01/15/2013    Procedure: HERNIA REPAIR INGUINAL ADULT;  Surgeon: Gwenyth Ober, MD;  Location: [place];  Service: General;  Laterality: Right;   ROS: Constitutional: Denies fever, chills, weight loss/gain, headaches, insomnia, fatigue, night sweats, and change in appetite. Eyes: Denies redness, blurred vision, diplopia, discharge, itchy, watery eyes.  ENT: Denies discharge, congestion, post nasal drip, epistaxis, sore throat, earache, hearing loss, dental pain, Tinnitus, Vertigo, Sinus pain, snoring.  Cardio: Denies chest pain, palpitations, irregular heartbeat, syncope, dyspnea, diaphoresis, orthopnea, PND, claudication, edema Respiratory: denies cough, dyspnea, DOE, pleurisy, hoarseness, laryngitis, wheezing.  Gastrointestinal: Denies dysphagia, heartburn, reflux, water brash, pain, cramps, nausea, vomiting, bloating, diarrhea, constipation, hematemesis, melena, hematochezia, jaundice, hemorrhoids Genitourinary: Denies dysuria, frequency, urgency, nocturia, hesitancy, discharge, hematuria, flank pain Breast: Breast lumps, nipple discharge, bleeding.  Musculoskeletal: Denies arthralgia, myalgia, stiffness, Jt. Swelling, pain, limp, and strain/sprain. Denies falls. Skin: Denies puritis, rash, hives, warts, acne, eczema, changing in skin lesion Neuro: No weakness, tremor, incoordination, spasms, paresthesia, pain Psychiatric: Denies confusion, memory loss, sensory loss. Denies Depression. Endocrine: Denies change in weight, skin, hair change, nocturia, and paresthesia, diabetic polys, visual blurring, hyper / hypo glycemic episodes.  Heme/Lymph: No excessive bleeding, bruising, enlarged lymph nodes  Objective:     BP 108/66   Pulse 72  Temp 98 F   Resp 16  Ht 5\' 10"    Wt 151 lb   BMI 21.67   General Appearance: Well  nourished, alert, WD/WN, male and in no apparent distress. Eyes: PERRLA, EOMs, conjunctiva no swelling or erythema, normal fundi and vessels. Sinuses: No frontal/maxillary tenderness ENT/Mouth: EACs patent / TMs  nl. Nares Brian without erythema, swelling, mucoid exudates. Oral hygiene is good. No erythema, swelling, or exudate. Tongue normal, non-obstructing. Tonsils not swollen or erythematous. Hearing normal.  Neck: Supple, thyroid normal. No bruits, nodes or JVD. Respiratory: Respiratory effort normal.  BS equal and Brian bilateral without rales, rhonci, wheezing or stridor. Cardio: Heart sounds are normal with regular rate and rhythm and no murmurs, rubs or gallops. Peripheral pulses are normal and equal bilaterally without edema. No aortic or femoral bruits. Chest: symmetric with normal excursions and percussion. Breasts: Symmetric, without lumps, nipple discharge, retractions, or fibrocystic changes.  Abdomen: Flat, soft  with nl bowel sounds. Nontender, no guarding, rebound, hernias, masses, or organomegaly.  Lymphatics: Non tender without lymphadenopathy.  Genitourinary: DRE- negative with prostatic fossa flat & smooth. Hemoccult Negative.  Musculoskeletal:  Full ROM all peripheral extremities, joint stability, 5/5 strength, and normal gait. Skin: Warm and dry without rashes, lesions, cyanosis, clubbing or  ecchymosis.  Neuro: Cranial nerves intact, reflexes equal bilaterally. Normal muscle tone, no cerebellar symptoms. Sensation intact.  Pysch: Alert and oriented X 3, normal affect, Insight and Judgment appropriate.   Cognitive Testing  Alert? Yes  Normal Appearance?Yes  Oriented to person? Yes  Place? Yes   Time? Yes  Recall of three objects?  Yes  Can perform simple calculations? Yes  Displays appropriate judgment? Yes  Can read the correct time from a watch/clock?Yes  Medicare Attestation I have personally reviewed: The patient's medical and social history Their use of  alcohol, tobacco or illicit drugs Their current medications and supplements The patient's functional ability including ADLs,fall risks, home safety risks, cognitive, and hearing and visual impairment Diet and physical activities Evidence for depression or mood disorders  The patient's weight, height, BMI, and visual acuity have been recorded in the chart.  I have made referrals, counseling, and provided education to the patient based on review of the above and I have provided the patient with a written personalized care plan for preventive services.  Over 40 minutes of exam, counseling, chart review was performed.  Brian Dunckel DAVID, MD   05/19/2015

## 2015-05-20 LAB — URINALYSIS, ROUTINE W REFLEX MICROSCOPIC
Bilirubin Urine: NEGATIVE
Glucose, UA: NEGATIVE mg/dL
Hgb urine dipstick: NEGATIVE
KETONES UR: NEGATIVE mg/dL
Leukocytes, UA: NEGATIVE
NITRITE: NEGATIVE
PH: 6 (ref 5.0–8.0)
PROTEIN: NEGATIVE mg/dL
SPECIFIC GRAVITY, URINE: 1.008 (ref 1.005–1.030)
Urobilinogen, UA: 0.2 mg/dL (ref 0.0–1.0)

## 2015-05-20 LAB — INSULIN, RANDOM: Insulin: 50.4 u[IU]/mL — ABNORMAL HIGH (ref 2.0–19.6)

## 2015-05-20 LAB — PSA: PSA: <0.01 ng/mL (ref <=4.00)

## 2015-05-20 LAB — VITAMIN D 25 HYDROXY (VIT D DEFICIENCY, FRACTURES): Vit D, 25-Hydroxy: 54 ng/mL (ref 30–100)

## 2015-05-20 LAB — HEMOGLOBIN A1C
Hgb A1c MFr Bld: 5.6 % (ref <5.7)
Mean Plasma Glucose: 114 mg/dL (ref <117)

## 2015-05-23 NOTE — Addendum Note (Signed)
Addended by: Conny Moening A on: 05/23/2015 09:38 AM   Modules accepted: Orders

## 2015-05-23 NOTE — Addendum Note (Signed)
Addended by: Jewell Ryans A on: 05/23/2015 10:58 AM   Modules accepted: Orders

## 2015-09-02 DIAGNOSIS — L720 Epidermal cyst: Secondary | ICD-10-CM | POA: Diagnosis not present

## 2015-09-02 DIAGNOSIS — D692 Other nonthrombocytopenic purpura: Secondary | ICD-10-CM | POA: Diagnosis not present

## 2015-09-02 DIAGNOSIS — L821 Other seborrheic keratosis: Secondary | ICD-10-CM | POA: Diagnosis not present

## 2015-09-02 DIAGNOSIS — Z85828 Personal history of other malignant neoplasm of skin: Secondary | ICD-10-CM | POA: Diagnosis not present

## 2015-09-02 DIAGNOSIS — L570 Actinic keratosis: Secondary | ICD-10-CM | POA: Diagnosis not present

## 2015-09-03 ENCOUNTER — Encounter: Payer: Self-pay | Admitting: Internal Medicine

## 2015-09-03 ENCOUNTER — Ambulatory Visit (INDEPENDENT_AMBULATORY_CARE_PROVIDER_SITE_OTHER): Payer: Medicare Other | Admitting: Internal Medicine

## 2015-09-03 VITALS — BP 108/64 | HR 82 | Temp 98.2°F | Resp 16 | Ht 70.0 in | Wt 150.0 lb

## 2015-09-03 DIAGNOSIS — R7303 Prediabetes: Secondary | ICD-10-CM

## 2015-09-03 DIAGNOSIS — E785 Hyperlipidemia, unspecified: Secondary | ICD-10-CM

## 2015-09-03 DIAGNOSIS — Z23 Encounter for immunization: Secondary | ICD-10-CM | POA: Diagnosis not present

## 2015-09-03 DIAGNOSIS — I10 Essential (primary) hypertension: Secondary | ICD-10-CM

## 2015-09-03 DIAGNOSIS — E559 Vitamin D deficiency, unspecified: Secondary | ICD-10-CM | POA: Diagnosis not present

## 2015-09-03 DIAGNOSIS — Z79899 Other long term (current) drug therapy: Secondary | ICD-10-CM | POA: Diagnosis not present

## 2015-09-03 DIAGNOSIS — Z0000 Encounter for general adult medical examination without abnormal findings: Secondary | ICD-10-CM | POA: Insufficient documentation

## 2015-09-03 DIAGNOSIS — R7309 Other abnormal glucose: Secondary | ICD-10-CM

## 2015-09-03 LAB — HEPATIC FUNCTION PANEL
ALK PHOS: 66 U/L (ref 40–115)
ALT: 25 U/L (ref 9–46)
AST: 31 U/L (ref 10–35)
Albumin: 4.3 g/dL (ref 3.6–5.1)
BILIRUBIN DIRECT: 0.2 mg/dL (ref <=0.2)
BILIRUBIN INDIRECT: 0.6 mg/dL (ref 0.2–1.2)
Total Bilirubin: 0.8 mg/dL (ref 0.2–1.2)
Total Protein: 7 g/dL (ref 6.1–8.1)

## 2015-09-03 LAB — CBC WITH DIFFERENTIAL/PLATELET
Basophils Absolute: 0.1 10*3/uL (ref 0.0–0.1)
Basophils Relative: 1 % (ref 0–1)
Eosinophils Absolute: 0.5 10*3/uL (ref 0.0–0.7)
Eosinophils Relative: 6 % — ABNORMAL HIGH (ref 0–5)
HCT: 46.1 % (ref 39.0–52.0)
HEMOGLOBIN: 16 g/dL (ref 13.0–17.0)
LYMPHS PCT: 23 % (ref 12–46)
Lymphs Abs: 2.1 10*3/uL (ref 0.7–4.0)
MCH: 31.1 pg (ref 26.0–34.0)
MCHC: 34.7 g/dL (ref 30.0–36.0)
MCV: 89.7 fL (ref 78.0–100.0)
MONOS PCT: 9 % (ref 3–12)
MPV: 9.7 fL (ref 8.6–12.4)
Monocytes Absolute: 0.8 10*3/uL (ref 0.1–1.0)
NEUTROS ABS: 5.6 10*3/uL (ref 1.7–7.7)
NEUTROS PCT: 61 % (ref 43–77)
Platelets: 265 10*3/uL (ref 150–400)
RBC: 5.14 MIL/uL (ref 4.22–5.81)
RDW: 14.5 % (ref 11.5–15.5)
WBC: 9.1 10*3/uL (ref 4.0–10.5)

## 2015-09-03 LAB — BASIC METABOLIC PANEL WITH GFR
BUN: 15 mg/dL (ref 7–25)
CHLORIDE: 103 mmol/L (ref 98–110)
CO2: 29 mmol/L (ref 20–31)
Calcium: 9.3 mg/dL (ref 8.6–10.3)
Creat: 0.97 mg/dL (ref 0.70–1.18)
GFR, EST NON AFRICAN AMERICAN: 75 mL/min (ref >=60)
GFR, Est African American: 87 mL/min (ref >=60)
Glucose, Bld: 88 mg/dL (ref 65–99)
Potassium: 5.2 mmol/L (ref 3.5–5.3)
Sodium: 141 mmol/L (ref 135–146)

## 2015-09-03 LAB — TSH: TSH: 3.336 u[IU]/mL (ref 0.350–4.500)

## 2015-09-03 LAB — MAGNESIUM: Magnesium: 2.2 mg/dL (ref 1.5–2.5)

## 2015-09-03 LAB — LIPID PANEL
CHOL/HDL RATIO: 3.9 ratio (ref <=5.0)
CHOLESTEROL: 160 mg/dL (ref 125–200)
HDL: 41 mg/dL (ref >=40)
LDL Cholesterol: 94 mg/dL (ref <130)
Triglycerides: 125 mg/dL (ref <150)
VLDL: 25 mg/dL (ref <30)

## 2015-09-03 NOTE — Progress Notes (Signed)
Patient ID: Brian Proctor, male   DOB: 09/21/1936, 79 y.o.   MRN: [id number]  Assessment and Plan:  Hypertension:  -Continue medication,  -monitor blood pressure at home.  -Continue DASH diet.   -Reminder to go to the ER if any CP, SOB, nausea, dizziness, severe HA, changes vision/speech, left arm numbness and tingling, and jaw pain.  Cholesterol: -Continue diet and exercise.  -Check cholesterol.   Pre-diabetes: -Continue diet and exercise.  -Check A1C  Vitamin D Def: -check level -continue medications.   TD and High dose flu given today  Continue diet and meds as discussed. Further disposition pending results of labs.  HPI 79 y.o. male  presents for 3 month follow up with hypertension, hyperlipidemia, prediabetes and vitamin D.   His blood pressure has been controlled at home, today their BP is BP: 108/64 mmHg.   He does workout. He denies chest pain, shortness of breath, dizziness.  Patient reports that he has been golfing and has been working out in the yard.     He is on cholesterol medication and denies myalgias. His cholesterol is at goal. The cholesterol last visit was:   Lab Results  Component Value Date   CHOL 181 05/19/2015   HDL 44 05/19/2015   LDLCALC 109* 05/19/2015   TRIG 139 05/19/2015   CHOLHDL 4.1 05/19/2015  He has been taking 1/2 tablet every other day.  He would ideally like to not take it at all.     He has been working on diet and exercise for prediabetes, and denies foot ulcerations, hyperglycemia, hypoglycemia , increased appetite, nausea, paresthesia of the feet, polydipsia, polyuria, visual disturbances, vomiting and weight loss. Last A1C in the office was:  Lab Results  Component Value Date   HGBA1C 5.6 05/19/2015    Patient is on Vitamin D supplement.  Lab Results  Component Value Date   VD25OH 54 05/19/2015      Current Medications:  Current Outpatient Prescriptions on File Prior to Visit  Medication Sig Dispense Refill  . Ascorbic  Acid (VITAMIN C PO) Take by mouth daily.    [name] aspirin 81 MG tablet Take 81 mg by mouth daily.    [name] atorvastatin (LIPITOR) 40 MG tablet Take 1 tablet (40 mg total) by mouth daily. 90 tablet 1  . Cholecalciferol (VITAMIN D PO) Take by mouth daily.    . fish oil-omega-3 fatty acids 1000 MG capsule Take 2 g by mouth daily.    . fluorouracil (EFUDEX) 5 % cream     . Multiple Vitamins-Minerals (MULTIVITAMIN PO) Take by mouth daily.    [name] PROCTOZONE-HC 2.5 % rectal cream APPLY RECTALLY TWICE DAILY AS DIRECTED 30 g 99   No current facility-administered medications on file prior to visit.    Medical History:  Past Medical History  Diagnosis Date  . Inguinal hernia   . Hearing loss     wear hearing aid  . No pertinent past medical history   . Wears hearing aid     both ears  . Wears glasses   . Hyperlipidemia   . Hypertension   . Cancer     prostate - basal cell  . GERD (gastroesophageal reflux disease)   . Prediabetes   . Vitamin D deficiency     Allergies: No Known Allergies   Review of Systems:  Review of Systems  Constitutional: Negative for fever, chills and malaise/fatigue.  HENT: Negative for congestion, ear pain and sore throat.   Eyes: Negative.  Respiratory: Negative for cough, shortness of breath and wheezing.   Cardiovascular: Negative for chest pain, palpitations and leg swelling.  Gastrointestinal: Negative for heartburn, diarrhea, constipation, blood in stool and melena.  Genitourinary: Negative.   Neurological: Negative for dizziness, seizures and headaches.  Psychiatric/Behavioral: Negative for depression. The patient is not nervous/anxious and does not have insomnia.     Family history- Review and unchanged  Social history- Review and unchanged  Physical Exam: BP 108/64 mmHg  Pulse 82  Temp(Src) 98.2 F (36.8 C) (Temporal)  Resp 16  Ht 5\' 10"  (1.778 m)  Wt 150 lb (68.04 kg)  BMI 21.52 kg/m2 Wt Readings from Last 3 Encounters:  09/03/15 150 lb (68.04  kg)  05/19/15 151 lb (68.493 kg)  01/28/15 153 lb (69.4 kg)    General Appearance: Well nourished well developed, in no apparent distress. Eyes: PERRLA, EOMs, conjunctiva no swelling or erythema ENT/Mouth: Ear canals normal without obstruction, swelling, erythma, discharge.  TMs normal bilaterally.  Oropharynx moist, clear, without exudate, or postoropharyngeal swelling. Neck: Supple, thyroid normal,no cervical adenopathy  Respiratory: Respiratory effort normal, Breath sounds clear A&P without rhonchi, wheeze, or rale.  No retractions, no accessory usage. Cardio: RRR with no MRGs. Brisk peripheral pulses without edema.  Abdomen: Soft, + BS,  Non tender, no guarding, rebound, hernias, masses. Musculoskeletal: Full ROM, 5/5 strength, Normal gait Skin: Warm, dry without rashes, lesions, ecchymosis.  Neuro: Awake and oriented X 3, Cranial nerves intact. Normal muscle tone, no cerebellar symptoms. Psych: Normal affect, Insight and Judgment appropriate.    Starlyn Skeans, PA-C 9:00 AM [facility]

## 2015-09-03 NOTE — Addendum Note (Signed)
Addended by: Geno Sydnor A on: 09/03/2015 09:34 AM   Modules accepted: Orders

## 2015-09-04 LAB — HEMOGLOBIN A1C
HEMOGLOBIN A1C: 5.5 % (ref <5.7)
Mean Plasma Glucose: 111 mg/dL (ref <117)

## 2015-09-04 LAB — VITAMIN D 25 HYDROXY (VIT D DEFICIENCY, FRACTURES): Vit D, 25-Hydroxy: 59 ng/mL (ref 30–100)

## 2015-09-04 LAB — INSULIN, RANDOM: Insulin: 5.2 u[IU]/mL (ref 2.0–19.6)

## 2015-10-01 DIAGNOSIS — Z85828 Personal history of other malignant neoplasm of skin: Secondary | ICD-10-CM | POA: Diagnosis not present

## 2015-10-01 DIAGNOSIS — L821 Other seborrheic keratosis: Secondary | ICD-10-CM | POA: Diagnosis not present

## 2015-10-01 DIAGNOSIS — L570 Actinic keratosis: Secondary | ICD-10-CM | POA: Diagnosis not present

## 2015-10-01 DIAGNOSIS — D485 Neoplasm of uncertain behavior of skin: Secondary | ICD-10-CM | POA: Diagnosis not present

## 2015-12-04 ENCOUNTER — Ambulatory Visit (INDEPENDENT_AMBULATORY_CARE_PROVIDER_SITE_OTHER): Payer: Medicare Other | Admitting: Internal Medicine

## 2015-12-04 ENCOUNTER — Encounter: Payer: Self-pay | Admitting: Internal Medicine

## 2015-12-04 VITALS — BP 122/80 | HR 64 | Temp 97.3°F | Resp 16 | Ht 70.0 in | Wt 154.2 lb

## 2015-12-04 DIAGNOSIS — Z6822 Body mass index (BMI) 22.0-22.9, adult: Secondary | ICD-10-CM

## 2015-12-04 DIAGNOSIS — Z1331 Encounter for screening for depression: Secondary | ICD-10-CM

## 2015-12-04 DIAGNOSIS — E559 Vitamin D deficiency, unspecified: Secondary | ICD-10-CM

## 2015-12-04 DIAGNOSIS — Z79899 Other long term (current) drug therapy: Secondary | ICD-10-CM

## 2015-12-04 DIAGNOSIS — Z789 Other specified health status: Secondary | ICD-10-CM | POA: Diagnosis not present

## 2015-12-04 DIAGNOSIS — K219 Gastro-esophageal reflux disease without esophagitis: Secondary | ICD-10-CM

## 2015-12-04 DIAGNOSIS — R7303 Prediabetes: Secondary | ICD-10-CM | POA: Diagnosis not present

## 2015-12-04 DIAGNOSIS — R7309 Other abnormal glucose: Secondary | ICD-10-CM | POA: Diagnosis not present

## 2015-12-04 DIAGNOSIS — I10 Essential (primary) hypertension: Secondary | ICD-10-CM | POA: Diagnosis not present

## 2015-12-04 DIAGNOSIS — Z9181 History of falling: Secondary | ICD-10-CM

## 2015-12-04 DIAGNOSIS — E785 Hyperlipidemia, unspecified: Secondary | ICD-10-CM | POA: Diagnosis not present

## 2015-12-04 DIAGNOSIS — Z1389 Encounter for screening for other disorder: Secondary | ICD-10-CM

## 2015-12-04 LAB — CBC WITH DIFFERENTIAL/PLATELET
Basophils Absolute: 0.1 10*3/uL (ref 0.0–0.1)
Basophils Relative: 1 % (ref 0–1)
Eosinophils Absolute: 0.5 10*3/uL (ref 0.0–0.7)
Eosinophils Relative: 5 % (ref 0–5)
HEMATOCRIT: 47.5 % (ref 39.0–52.0)
HEMOGLOBIN: 15.8 g/dL (ref 13.0–17.0)
LYMPHS ABS: 2.2 10*3/uL (ref 0.7–4.0)
LYMPHS PCT: 22 % (ref 12–46)
MCH: 30.4 pg (ref 26.0–34.0)
MCHC: 33.3 g/dL (ref 30.0–36.0)
MCV: 91.3 fL (ref 78.0–100.0)
MONO ABS: 0.7 10*3/uL (ref 0.1–1.0)
MONOS PCT: 7 % (ref 3–12)
MPV: 10.6 fL (ref 8.6–12.4)
NEUTROS ABS: 6.6 10*3/uL (ref 1.7–7.7)
Neutrophils Relative %: 65 % (ref 43–77)
Platelets: 262 10*3/uL (ref 150–400)
RBC: 5.2 MIL/uL (ref 4.22–5.81)
RDW: 13.6 % (ref 11.5–15.5)
WBC: 10.2 10*3/uL (ref 4.0–10.5)

## 2015-12-04 LAB — BASIC METABOLIC PANEL WITH GFR
BUN: 20 mg/dL (ref 7–25)
CALCIUM: 9.4 mg/dL (ref 8.6–10.3)
CHLORIDE: 104 mmol/L (ref 98–110)
CO2: 30 mmol/L (ref 20–31)
Creat: 0.94 mg/dL (ref 0.70–1.18)
GFR, Est African American: >89 mL/min (ref >=60)
GFR, Est Non African American: 78 mL/min (ref >=60)
GLUCOSE: 84 mg/dL (ref 65–99)
Potassium: 4.6 mmol/L (ref 3.5–5.3)
Sodium: 140 mmol/L (ref 135–146)

## 2015-12-04 LAB — LIPID PANEL
CHOL/HDL RATIO: 3.4 ratio (ref <=5.0)
Cholesterol: 155 mg/dL (ref 125–200)
HDL: 46 mg/dL (ref >=40)
LDL Cholesterol: 91 mg/dL (ref <130)
TRIGLYCERIDES: 89 mg/dL (ref <150)
VLDL: 18 mg/dL (ref <30)

## 2015-12-04 LAB — HEPATIC FUNCTION PANEL
ALBUMIN: 3.9 g/dL (ref 3.6–5.1)
ALK PHOS: 59 U/L (ref 40–115)
ALT: 25 U/L (ref 9–46)
AST: 29 U/L (ref 10–35)
BILIRUBIN DIRECT: 0.1 mg/dL (ref <=0.2)
Indirect Bilirubin: 0.5 mg/dL (ref 0.2–1.2)
Total Bilirubin: 0.6 mg/dL (ref 0.2–1.2)
Total Protein: 6.8 g/dL (ref 6.1–8.1)

## 2015-12-04 LAB — TSH: TSH: 3.42 u[IU]/mL (ref 0.350–4.500)

## 2015-12-04 LAB — MAGNESIUM: MAGNESIUM: 2 mg/dL (ref 1.5–2.5)

## 2015-12-04 NOTE — Progress Notes (Addendum)
Patient ID: Brian [facility], male   DOB: [birth year], 79 y.o.   MRN: [id number]   [name] for 6 month follow up with Hypertension, Hyperlipidemia, Pre-Diabetes and Vitamin D Deficiency.    Patient is treated for HTN & BP has been controlled at home. Today's BP: 122/80 mmHg. Patient has had no complaints of any cardiac type chest pain, palpitations, dyspnea/orthopnea/PND, dizziness, claudication, or dependent edema.   Hyperlipidemia is controlled with diet & meds. Patient denies myalgias or other med SE's. Last Lipids were at goal with Cholesterol 160; HDL 41; LDL 94; Triglycerides 125 on 09/03/2015.    Also, the patient has history of PreDiabetes and has had no symptoms of reactive hypoglycemia, diabetic polys, paresthesias or visual blurring.  Last A1c was at goal at  5.5% on 09/03/2015.   Further, the patient also has history of Vitamin D Deficiency and supplements vitamin D without any suspected side-effects. Last vitamin D was  59 on 09/03/2015.  Medication Sig  . VITAMIN C Take  daily.  [name] aspirin 81 MG tablet Take 81 mg  daily.  [name] atorvastatin  40 MG tablet Take 1 tablet  daily.  [name] VITAMIN D  Take  daily.  . fish oil-omega-3  1000 MG cap Take 2 g daily.  . fluorouracil (EFUDEX) 5 % crm  as directed  . Multiple Vitamins-Minerals  Take daily.  [name] PROCTOZONE-HC 2.5 % rec crm APPLY RECTALLY TWICE DAILY AS DIRECTED   No Known Allergies  PMHx:   Past Medical History  Diagnosis Date  . Inguinal hernia   . Hearing loss     wear hearing aid  . No pertinent past medical history   . Wears hearing aid     both ears  . Wears glasses   . Hyperlipidemia   . Hypertension   . Cancer [facility])     prostate - basal cell  . GERD (gastroesophageal reflux disease)   . Prediabetes   . Vitamin D deficiency    Immunization History  Administered Date(s) Administered  . DT 09/03/2015  . Influenza, High Dose Seasonal PF 09/27/2014, 09/03/2015  . Pneumococcal Conjugate-13  10/16/2014  . Pneumococcal-Unspecified 02/03/2011  . Td 07/26/2005  . Zoster 12/27/2006   Past Surgical History  Procedure Laterality Date  . Tonsillectomy      as a child  . Prostate surgery  1999 or 2000    due to cancer  . Colonoscopy      x2  . Inguinal hernia repair  01/15/2013    Procedure: HERNIA REPAIR INGUINAL ADULT;  Surgeon: Gwenyth Ober, MD;  Location: [place];  Service: General;  Laterality: Right;   FHx:    Reviewed / unchanged  SHx:    Reviewed / unchanged  Systems Review:  Constitutional: Denies fever, chills, wt changes, headaches, insomnia, fatigue, night sweats, change in appetite. Eyes: Denies redness, blurred vision, diplopia, discharge, itchy, watery eyes.  ENT: Denies discharge, congestion, post nasal drip, epistaxis, sore throat, earache, hearing loss, dental pain, tinnitus, vertigo, sinus pain, snoring.  CV: Denies chest pain, palpitations, irregular heartbeat, syncope, dyspnea, diaphoresis, orthopnea, PND, claudication or edema. Respiratory: denies cough, dyspnea, DOE, pleurisy, hoarseness, laryngitis, wheezing.  Gastrointestinal: Denies dysphagia, odynophagia, heartburn, reflux, water brash, abdominal pain or cramps, nausea, vomiting, bloating, diarrhea, constipation, hematemesis, melena, hematochezia  or hemorrhoids. Genitourinary: Denies dysuria, frequency, urgency, nocturia, hesitancy, discharge, hematuria or flank pain. Musculoskeletal: Denies arthralgias, myalgias, stiffness, jt. swelling, pain, limping or strain/sprain. Denies falls.  Skin: Denies pruritus, rash, hives, warts, acne, eczema or change in skin lesion(s). Neuro: No weakness, tremor, incoordination, spasms, paresthesia or pain. Psychiatric: Denies confusion, memory loss or sensory loss. Denies depression or Mood changes. Endo: Denies change in weight, skin or hair change.  Heme/Lymph: No excessive bleeding, bruising or enlarged lymph nodes.  Physical Exam  BP 122/80  mmHg  Pulse 64  Temp(Src) 97.3 F (36.3 C)  Resp 16  Ht 5\' 10"  (1.778 m)  Wt 154 lb 3.2 oz (69.945 kg)  BMI 22.13 kg/m2  Appears well nourished and in no distress. Eyes: PERRLA, EOMs, conjunctiva no swelling or erythema. Sinuses: No frontal/maxillary tenderness ENT/Mouth: EAC's clear, TM's nl w/o erythema, bulging. Nares clear w/o erythema, swelling, exudates. Oropharynx clear without erythema or exudates. Oral hygiene is good. Tongue normal, non obstructing. Hearing intact.  Neck: Supple. Thyroid nl. Car 2+/2+ without bruits, nodes or JVD. Chest: Respirations nl with BS clear & equal w/o rales, rhonchi, wheezing or stridor.  Cor: Heart sounds normal w/ regular rate and rhythm without sig. murmurs, gallops, clicks, or rubs. Peripheral pulses normal and equal  without edema.  Abdomen: Soft & bowel sounds normal. Non-tender w/o guarding, rebound, hernias, masses, or organomegaly.  Lymphatics: Unremarkable.  Musculoskeletal: Full ROM all peripheral extremities, joint stability, 5/5 strength, and normal gait.  Skin: Warm, dry without exposed rashes, lesions or ecchymosis apparent.  Neuro: Cranial nerves intact, reflexes equal bilaterally. Sensory-motor testing grossly intact. Tendon reflexes grossly intact.  Pysch: Alert & oriented x 3.  Insight and judgement nl & appropriate. No ideations.  Assessment and Plan:  1. Essential hypertension  - TSH  2. Hyperlipidemia  - Lipid panel - TSH  3. Prediabetes  - Hemoglobin A1c - Insulin, random  4. Vitamin D deficiency  - VITAMIN D 25 Hydroxy   5. Other abnormal glucose  - Hemoglobin A1c - Insulin, random  6. Gastroesophageal reflux disease   7. Medication management  - CBC with Differential/Platelet - BASIC METABOLIC PANEL WITH GFR - Hepatic function panel - Magnesium   Recommended regular exercise, BP monitoring, weight control, and discussed med and SE's. Recommended labs to assess and monitor clinical status. Further  disposition pending results of labs. Over 30 minutes of exam, counseling, chart review was performed

## 2015-12-04 NOTE — Patient Instructions (Signed)

## 2015-12-05 LAB — VITAMIN D 25 HYDROXY (VIT D DEFICIENCY, FRACTURES): VIT D 25 HYDROXY: 70 ng/mL (ref 30–100)

## 2015-12-05 LAB — HEMOGLOBIN A1C
Hgb A1c MFr Bld: 5.5 % (ref <5.7)
Mean Plasma Glucose: 111 mg/dL (ref <117)

## 2015-12-05 LAB — INSULIN, RANDOM: INSULIN: 5.1 u[IU]/mL (ref 2.0–19.6)

## 2015-12-06 ENCOUNTER — Encounter: Payer: Self-pay | Admitting: Internal Medicine

## 2015-12-06 DIAGNOSIS — Z6822 Body mass index (BMI) 22.0-22.9, adult: Secondary | ICD-10-CM | POA: Insufficient documentation

## 2016-03-03 ENCOUNTER — Encounter: Payer: Self-pay | Admitting: Physician Assistant

## 2016-03-03 ENCOUNTER — Ambulatory Visit (INDEPENDENT_AMBULATORY_CARE_PROVIDER_SITE_OTHER): Payer: Medicare Other | Admitting: Physician Assistant

## 2016-03-03 VITALS — BP 110/68 | HR 62 | Temp 97.0°F | Resp 16 | Ht 70.0 in | Wt 156.8 lb

## 2016-03-03 DIAGNOSIS — L821 Other seborrheic keratosis: Secondary | ICD-10-CM | POA: Diagnosis not present

## 2016-03-03 DIAGNOSIS — Z79899 Other long term (current) drug therapy: Secondary | ICD-10-CM | POA: Diagnosis not present

## 2016-03-03 DIAGNOSIS — R7303 Prediabetes: Secondary | ICD-10-CM | POA: Diagnosis not present

## 2016-03-03 DIAGNOSIS — I10 Essential (primary) hypertension: Secondary | ICD-10-CM

## 2016-03-03 DIAGNOSIS — E785 Hyperlipidemia, unspecified: Secondary | ICD-10-CM

## 2016-03-03 DIAGNOSIS — D485 Neoplasm of uncertain behavior of skin: Secondary | ICD-10-CM | POA: Diagnosis not present

## 2016-03-03 DIAGNOSIS — C61 Malignant neoplasm of prostate: Secondary | ICD-10-CM | POA: Diagnosis not present

## 2016-03-03 DIAGNOSIS — L820 Inflamed seborrheic keratosis: Secondary | ICD-10-CM | POA: Diagnosis not present

## 2016-03-03 DIAGNOSIS — C44729 Squamous cell carcinoma of skin of left lower limb, including hip: Secondary | ICD-10-CM | POA: Diagnosis not present

## 2016-03-03 DIAGNOSIS — E559 Vitamin D deficiency, unspecified: Secondary | ICD-10-CM | POA: Diagnosis not present

## 2016-03-03 DIAGNOSIS — L570 Actinic keratosis: Secondary | ICD-10-CM | POA: Diagnosis not present

## 2016-03-03 DIAGNOSIS — Z85828 Personal history of other malignant neoplasm of skin: Secondary | ICD-10-CM | POA: Diagnosis not present

## 2016-03-03 LAB — BASIC METABOLIC PANEL WITH GFR
BUN: 16 mg/dL (ref 7–25)
CALCIUM: 9.5 mg/dL (ref 8.6–10.3)
CO2: 31 mmol/L (ref 20–31)
CREATININE: 1.08 mg/dL (ref 0.70–1.18)
Chloride: 103 mmol/L (ref 98–110)
GFR, EST AFRICAN AMERICAN: 76 mL/min (ref >=60)
GFR, EST NON AFRICAN AMERICAN: 65 mL/min (ref >=60)
GLUCOSE: 76 mg/dL (ref 65–99)
Potassium: 5.1 mmol/L (ref 3.5–5.3)
Sodium: 139 mmol/L (ref 135–146)

## 2016-03-03 LAB — LIPID PANEL
Cholesterol: 140 mg/dL (ref 125–200)
HDL: 40 mg/dL (ref >=40)
LDL CALC: 77 mg/dL (ref <130)
Total CHOL/HDL Ratio: 3.5 Ratio (ref <=5.0)
Triglycerides: 115 mg/dL (ref <150)
VLDL: 23 mg/dL (ref <30)

## 2016-03-03 LAB — HEPATIC FUNCTION PANEL
ALBUMIN: 4.2 g/dL (ref 3.6–5.1)
ALT: 23 U/L (ref 9–46)
AST: 28 U/L (ref 10–35)
Alkaline Phosphatase: 68 U/L (ref 40–115)
Bilirubin, Direct: 0.1 mg/dL (ref <=0.2)
Indirect Bilirubin: 0.5 mg/dL (ref 0.2–1.2)
TOTAL PROTEIN: 6.8 g/dL (ref 6.1–8.1)
Total Bilirubin: 0.6 mg/dL (ref 0.2–1.2)

## 2016-03-03 LAB — CBC WITH DIFFERENTIAL/PLATELET
BASOS PCT: 1 % (ref 0–1)
Basophils Absolute: 0.1 10*3/uL (ref 0.0–0.1)
EOS PCT: 5 % (ref 0–5)
Eosinophils Absolute: 0.4 10*3/uL (ref 0.0–0.7)
HEMATOCRIT: 46.2 % (ref 39.0–52.0)
HEMOGLOBIN: 15.4 g/dL (ref 13.0–17.0)
Lymphocytes Relative: 23 % (ref 12–46)
Lymphs Abs: 1.9 10*3/uL (ref 0.7–4.0)
MCH: 30.6 pg (ref 26.0–34.0)
MCHC: 33.3 g/dL (ref 30.0–36.0)
MCV: 91.7 fL (ref 78.0–100.0)
MONO ABS: 0.8 10*3/uL (ref 0.1–1.0)
MONOS PCT: 9 % (ref 3–12)
MPV: 9.7 fL (ref 8.6–12.4)
NEUTROS ABS: 5.2 10*3/uL (ref 1.7–7.7)
Neutrophils Relative %: 62 % (ref 43–77)
Platelets: 247 10*3/uL (ref 150–400)
RBC: 5.04 MIL/uL (ref 4.22–5.81)
RDW: 13.8 % (ref 11.5–15.5)
WBC: 8.4 10*3/uL (ref 4.0–10.5)

## 2016-03-03 LAB — MAGNESIUM: MAGNESIUM: 2.2 mg/dL (ref 1.5–2.5)

## 2016-03-03 LAB — TSH: TSH: 3.28 m[IU]/L (ref 0.40–4.50)

## 2016-03-03 NOTE — Patient Instructions (Addendum)
Please pick one of the over the counter allergy medications below and take it once daily for allergies.  Claritin or loratadine cheapest but likely the weakest  Zyrtec or certizine at night because it can make you sleepy The strongest is allegra or fexafinadine  Cheapest at [employer], [employer], [employer]  Cholesterol Cholesterol is a fat. Your body needs a small amount of cholesterol. Cholesterol may build up in your blood vessels. This increases your chance of having a heart attack or stroke. You cannot feel your cholesterol levels. The only way to know your cholesterol level is high is with a blood test. Keep your test results. Work with your doctor to keep your cholesterol at a good level. WHAT DO THE TEST RESULTS MEAN?  Total cholesterol is how much cholesterol is in your blood.  LDL is bad cholesterol. This is the type that can build up. You want LDL to be low.  HDL is good cholesterol. It cleans your blood vessels and carries LDL away. You want HDL to be high.  Triglycerides are fat that the body can burn for energy or store. WHAT ARE GOOD LEVELS OF CHOLESTEROL?  Total cholesterol below 200.  LDL below 100 for people at risk. Below 70 for those at very high risk.  HDL above 50 is good. Above 60 is best.  Triglycerides below 150. HOW CAN I LOWER MY CHOLESTEROL?  Diet. Follow your diet programs as told by your doctor.  Choose fish, white meat chicken, roasted [place], or baked [place]. Try not to eat red meat, fried foods, or processed meats such as sausage and lunch meats.  Eat lots of fresh fruits and vegetables.  Choose whole grains, beans, pasta, potatoes, and cereals.  Use only small amounts of olive, corn, or canola oils.  Try not to eat butter, mayonnaise, shortening, or palm kernel oils.  Try not to eat foods with trans fats.  Drink skim or nonfat milk. Eat low-fat or nonfat yogurt and cheeses. Try not to drink whole milk or cream. Try not to eat ice cream, egg yolks, and  full-fat cheeses.  Healthy desserts include angel food cake, ginger snaps, animal crackers, hard candy, popsicles, and low-fat or nonfat frozen yogurt. Try not to eat pastries, cakes, pies, and cookies.  Exercise. Follow your exercise programs as told by your doctor.  Be more active. You can try gardening, walking, or taking the stairs. Ask your doctor about how you can be more active.  Medicine. Take medicine as told by your doctor.   This information is not intended to replace advice given to you by your health care provider. Make sure you discuss any questions you have with your health care provider.   Document Released: 03/11/2009 Document Revised: 01/03/2015 Document Reviewed: 09/26/2013 Elsevier Interactive Patient Education [employer].

## 2016-03-03 NOTE — Progress Notes (Signed)
Patient ID: Brian Proctor, male   DOB: July 14, 1936, 80 y.o.   MRN: [id number]  Assessment and Plan:  Hypertension:  -Continue medication,  -monitor blood pressure at home.  -Continue DASH diet.   -Reminder to go to the ER if any CP, SOB, nausea, dizziness, severe HA, changes vision/speech, left arm numbness and tingling, and jaw pain.  Cholesterol: -Continue diet and exercise.  -Check cholesterol.   Pre-diabetes: -Continue diet and exercise.  -Check A1C  Vitamin D Def: -check level -continue medications.   Continue diet and meds as discussed. Further disposition pending results of labs. Future Appointments Date Time Provider [place]  06/03/2016 3:00 PM Unk Pinto, MD [facility] None     HPI 80 y.o. male  presents for 3 month follow up with hypertension, hyperlipidemia, prediabetes and vitamin D.   His blood pressure has been controlled at home, today their BP is BP: 110/68 mmHg.   He does workout. He denies chest pain, shortness of breath, dizziness.  Patient reports that he has been golfing and has been working out in the yard.     He is on cholesterol medication, He has been taking 1/2 tablet every other day.  He would ideally like to not take it at all and denies myalgias. His cholesterol is at goal. The cholesterol last visit was:   Lab Results  Component Value Date   CHOL 155 12/04/2015   HDL 46 12/04/2015   LDLCALC 91 12/04/2015   TRIG 89 12/04/2015   CHOLHDL 3.4 12/04/2015     He has been working on diet and exercise for prediabetes, and denies foot ulcerations, hyperglycemia, hypoglycemia , increased appetite, nausea, paresthesia of the feet, polydipsia, polyuria, visual disturbances, vomiting and weight loss. Last A1C in the office was:  Lab Results  Component Value Date   HGBA1C 5.5 12/04/2015    Patient is on Vitamin D supplement.  Lab Results  Component Value Date   VD25OH 2 12/04/2015      Current Medications:  Current Outpatient  Prescriptions on File Prior to Visit  Medication Sig Dispense Refill  . Ascorbic Acid (VITAMIN C PO) Take by mouth daily.    [name] aspirin 81 MG tablet Take 81 mg by mouth daily.    [name] atorvastatin (LIPITOR) 40 MG tablet Take 1 tablet (40 mg total) by mouth daily. 90 tablet 1  . Cholecalciferol (VITAMIN D PO) Take by mouth daily.    . fish oil-omega-3 fatty acids 1000 MG capsule Take 2 g by mouth daily.    . Multiple Vitamins-Minerals (MULTIVITAMIN PO) Take by mouth daily.    [name] PROCTOZONE-HC 2.5 % rectal cream APPLY RECTALLY TWICE DAILY AS DIRECTED 30 g 99   No current facility-administered medications on file prior to visit.    Medical History:  Past Medical History  Diagnosis Date  . Inguinal hernia   . Hearing loss     wear hearing aid  . No pertinent past medical history   . Wears hearing aid     both ears  . Wears glasses   . Hyperlipidemia   . Hypertension   . Cancer [facility])     prostate - basal cell  . GERD (gastroesophageal reflux disease)   . Prediabetes   . Vitamin D deficiency     Allergies: No Known Allergies   Review of Systems:  Review of Systems  Constitutional: Negative for fever, chills and malaise/fatigue.  HENT: Negative for congestion, ear pain and sore throat.   Eyes: Negative.  Respiratory: Negative for cough, shortness of breath and wheezing.   Cardiovascular: Negative for chest pain, palpitations and leg swelling.  Gastrointestinal: Negative for heartburn, diarrhea, constipation, blood in stool and melena.  Genitourinary: Negative.   Neurological: Negative for dizziness, seizures and headaches.  Psychiatric/Behavioral: Negative for depression. The patient is not nervous/anxious and does not have insomnia.     Family history- Review and unchanged  Social history- Review and unchanged  Physical Exam: BP 110/68 mmHg  Pulse 62  Temp(Src) 97 F (36.1 C) (Temporal)  Resp 16  Ht 5\' 10"  (1.778 m)  Wt 156 lb 12.8 oz (71.124 kg)  BMI 22.50 kg/m2   SpO2 97% Wt Readings from Last 3 Encounters:  03/03/16 156 lb 12.8 oz (71.124 kg)  12/04/15 154 lb 3.2 oz (69.945 kg)  09/03/15 150 lb (68.04 kg)    General Appearance: Well nourished well developed, in no apparent distress. Eyes: PERRLA, EOMs, conjunctiva no swelling or erythema ENT/Mouth: Ear canals normal without obstruction, swelling, erythma, discharge.  TMs normal bilaterally.  Oropharynx moist, clear, without exudate, or postoropharyngeal swelling. Neck: Supple, thyroid normal,no cervical adenopathy  Respiratory: Respiratory effort normal, Breath sounds clear A&P without rhonchi, wheeze, or rale.  No retractions, no accessory usage. Cardio: RRR with no MRGs. Brisk peripheral pulses without edema.  Abdomen: Soft, + BS,  Non tender, no guarding, rebound, hernias, masses. Musculoskeletal: Full ROM, 5/5 strength, Normal gait Skin: Warm, dry without rashes, lesions, ecchymosis.  Neuro: Awake and oriented X 3, Cranial nerves intact. Normal muscle tone, no cerebellar symptoms. Psych: Normal affect, Insight and Judgment appropriate.    Vicie Mutters, PA-C 10:19 AM [facility]

## 2016-06-03 ENCOUNTER — Ambulatory Visit (INDEPENDENT_AMBULATORY_CARE_PROVIDER_SITE_OTHER): Payer: Medicare Other | Admitting: Internal Medicine

## 2016-06-03 ENCOUNTER — Encounter: Payer: Self-pay | Admitting: Internal Medicine

## 2016-06-03 VITALS — BP 112/70 | HR 72 | Temp 97.5°F | Resp 16 | Ht 69.5 in | Wt 155.4 lb

## 2016-06-03 DIAGNOSIS — I10 Essential (primary) hypertension: Secondary | ICD-10-CM | POA: Diagnosis not present

## 2016-06-03 DIAGNOSIS — R7303 Prediabetes: Secondary | ICD-10-CM

## 2016-06-03 DIAGNOSIS — Z79899 Other long term (current) drug therapy: Secondary | ICD-10-CM | POA: Diagnosis not present

## 2016-06-03 DIAGNOSIS — E559 Vitamin D deficiency, unspecified: Secondary | ICD-10-CM | POA: Diagnosis not present

## 2016-06-03 DIAGNOSIS — C61 Malignant neoplasm of prostate: Secondary | ICD-10-CM | POA: Diagnosis not present

## 2016-06-03 DIAGNOSIS — R7309 Other abnormal glucose: Secondary | ICD-10-CM | POA: Diagnosis not present

## 2016-06-03 DIAGNOSIS — E785 Hyperlipidemia, unspecified: Secondary | ICD-10-CM

## 2016-06-03 DIAGNOSIS — Z136 Encounter for screening for cardiovascular disorders: Secondary | ICD-10-CM

## 2016-06-03 DIAGNOSIS — Z1212 Encounter for screening for malignant neoplasm of rectum: Secondary | ICD-10-CM

## 2016-06-03 DIAGNOSIS — K219 Gastro-esophageal reflux disease without esophagitis: Secondary | ICD-10-CM

## 2016-06-03 LAB — CBC WITH DIFFERENTIAL/PLATELET
BASOS ABS: 118 {cells}/uL (ref 0–200)
BASOS PCT: 1 %
EOS ABS: 354 {cells}/uL (ref 15–500)
Eosinophils Relative: 3 %
HEMATOCRIT: 46.1 % (ref 38.5–50.0)
Hemoglobin: 15.3 g/dL (ref 13.2–17.1)
LYMPHS ABS: 3068 {cells}/uL (ref 850–3900)
Lymphocytes Relative: 26 %
MCH: 30.8 pg (ref 27.0–33.0)
MCHC: 33.2 g/dL (ref 32.0–36.0)
MCV: 92.8 fL (ref 80.0–100.0)
MONO ABS: 708 {cells}/uL (ref 200–950)
MPV: 10.5 fL (ref 7.5–12.5)
Monocytes Relative: 6 %
NEUTROS ABS: 7552 {cells}/uL (ref 1500–7800)
Neutrophils Relative %: 64 %
Platelets: 258 10*3/uL (ref 140–400)
RBC: 4.97 MIL/uL (ref 4.20–5.80)
RDW: 13.5 % (ref 11.0–15.0)
WBC: 11.8 10*3/uL — ABNORMAL HIGH (ref 3.8–10.8)

## 2016-06-03 LAB — HEMOGLOBIN A1C
Hgb A1c MFr Bld: 5.6 % (ref <5.7)
MEAN PLASMA GLUCOSE: 114 mg/dL

## 2016-06-03 NOTE — Progress Notes (Signed)
Patient ID: Brian Proctor, male   DOB: 09/09/1936, 80 y.o.   MRN: [id number]  [facility]   Unk Pinto, M.D.    [name]. Silverio Lay, P.A.-C      Starlyn Skeans, P.A.-C   [facility]                [address] [place], [place] [id number] Telephone [phone] Telefax [phone] _________________________________  Annual  Screening/Preventative Visit And Comprehensive Evaluation & Examination     This very nice 80 y.o. [name] presents for a Wellness/Preventative Visit & comprehensive evaluation and management of multiple medical co-morbidities.  Patient has been followed for HTN, Prediabetes, Hyperlipidemia and Vitamin D Deficiency. Patient also has hx/o Prostate Cancer and underwent suprapubic prostatectomy in 2000 by Dr Risa Grill.      Patient's labile HTN predates since 2005 and he has been monitored expectantly. Patient's BP has been controlled and today's BP: 112/70 mmHg. Patient denies any cardiac symptoms as chest pain, palpitations, shortness of breath, dizziness or ankle swelling.     Patient's hyperlipidemia is controlled with diet and medications. Patient denies myalgias or other medication SE's. Last lipids were at goal with  Cholesterol 140; HDL 40; LDL 77; Triglycerides 115 on 03/03/2016.     Patient has prediabetes since 2013 with A1c 5.7% and then down to 5.3% in 2014.  Patient denies reactive hypoglycemic symptoms, visual blurring, diabetic polys or paresthesias. Last A1c was still at goal with A1c 5.5% on 12/04/2015.     Finally, patient has history of Vitamin D Deficiency of 41 in 2013 while on supplements and last vitamin D was at goal with level of  70 on 12/04/2015.  Medication Sig  . Ascorbic Acid (VITAMIN C PO) Take by mouth daily.  [name] aspirin 81 MG tablet Take 81 mg by mouth daily.  [name] atorvastatin (LIPITOR) 40 MG tablet Take 1 tablet (40 mg total) by mouth daily.  . Cholecalciferol  (VITAMIN D PO) Take by mouth daily.  . fish oil-omega-3 fatty acids 1000 MG capsule Take 2 g by mouth daily.  . Multiple Vitamins-Minerals (MULTIVITAMIN PO) Take by mouth daily.  [name] PROCTOZONE-HC 2.5 % rectal cream APPLY RECTALLY TWICE DAILY AS DIRECTED   No Known Allergies   Past Medical History  Diagnosis Date  . Inguinal hernia   . Hearing loss     wear hearing aid  . No pertinent past medical history   . Wears hearing aid     both ears  . Wears glasses   . Hyperlipidemia   . Hypertension   . Cancer [facility])     prostate - basal cell  . GERD (gastroesophageal reflux disease)   . Prediabetes   . Vitamin D deficiency    Health Maintenance  Topic Date Due  . TETANUS/TDAP  07/27/2015  . INFLUENZA VACCINE  07/27/2016  . ZOSTAVAX  Completed  . PNA vac Low Risk Adult  Completed   Immunization History  Administered Date(s) Administered  . DT 09/03/2015  . Influenza, High Dose Seasonal PF 09/27/2014, 09/03/2015  . Pneumococcal Conjugate-13 10/16/2014  . Pneumococcal-Unspecified 02/03/2011  . Td 07/26/2005  . Zoster 12/27/2006   Past Surgical History  Procedure Laterality Date  . Tonsillectomy      as a child  . Prostate surgery  1999 or 2000    due to cancer  . Colonoscopy  x2  . Inguinal hernia repair  01/15/2013    Procedure: HERNIA REPAIR INGUINAL ADULT;  Surgeon: Gwenyth Ober, MD;  Location: [facility];  Service: General;  Laterality: Right;   Family History  Problem Relation Age of Onset  . Other Mother     bowel infarction  . COPD Father   . Heart disease Father   . Autism Son     Social History   Social History  . Marital Status: Married    Spouse Name: N/A  . Number of Children: N/A  . Years of Education: N/A   Occupational History  . Not on file.   Social History Main Topics  . Smoking status: Former Smoker    Quit date: 01/10/1999  . Smokeless tobacco: Never Used  . Alcohol Use: No     Comment: occasional  . Drug Use: No   . Sexual Activity: Not on file   Other Topics Concern  . Not on file   Social History Narrative    ROS Constitutional: Denies fever, chills, weight loss/gain, headaches, insomnia,  night sweats or change in appetite. Does c/o fatigue. Eyes: Denies redness, blurred vision, diplopia, discharge, itchy or watery eyes.  ENT: Denies discharge, congestion, post nasal drip, epistaxis, sore throat, earache, hearing loss, dental pain, Tinnitus, Vertigo, Sinus pain or snoring.  Cardio: Denies chest pain, palpitations, irregular heartbeat, syncope, dyspnea, diaphoresis, orthopnea, PND, claudication or edema Respiratory: denies cough, dyspnea, DOE, pleurisy, hoarseness, laryngitis or wheezing.  Gastrointestinal: Denies dysphagia, heartburn, reflux, water brash, pain, cramps, nausea, vomiting, bloating, diarrhea, constipation, hematemesis, melena, hematochezia, jaundice or hemorrhoids Genitourinary: Denies dysuria, frequency, urgency, nocturia, hesitancy, discharge, hematuria or flank pain Musculoskeletal: Denies arthralgia, myalgia, stiffness, Jt. Swelling, pain, limp or strain/sprain. Denies Falls. Skin: Denies puritis, rash, hives, warts, acne, eczema or change in skin lesion Neuro: No weakness, tremor, incoordination, spasms, paresthesia or pain Psychiatric: Denies confusion, memory loss or sensory loss. Denies Depression. Endocrine: Denies change in weight, skin, hair change, nocturia, and paresthesia, diabetic polys, visual blurring or hyper / hypo glycemic episodes.  Heme/Lymph: No excessive bleeding, bruising or enlarged lymph nodes.  Physical Exam  BP 112/70 mmHg  Pulse 72  Temp(Src) 97.5 F (36.4 C)  Resp 16  Ht 5' 9.5" (1.765 m)  Wt 155 lb 6.4 oz (70.489 kg)  BMI 22.63 kg/m2  General Appearance: Well nourished, in no apparent distress. Eyes: PERRLA, EOMs, conjunctiva no swelling or erythema, normal fundi and vessels. Sinuses: No frontal/maxillary tenderness ENT/Mouth: EACs patent /  TMs  nl. Nares clear without erythema, swelling, mucoid exudates. Oral hygiene is good. No erythema, swelling, or exudate. Tongue normal, non-obstructing. Tonsils not swollen or erythematous. Hearing normal.  Neck: Supple, thyroid normal. No bruits, nodes or JVD. Respiratory: Respiratory effort normal.  BS equal and clear bilateral without rales, rhonci, wheezing or stridor. Cardio: Heart sounds are normal with regular rate and rhythm and no murmurs, rubs or gallops. Peripheral pulses are normal and equal bilaterally without edema. No aortic or femoral bruits. Chest: symmetric with normal excursions and percussion.  Abdomen: Soft, with Nl bowel sounds. Nontender, no guarding, rebound, hernias, masses, or organomegaly.  Lymphatics: Non tender without lymphadenopathy.  Genitourinary: No hernias.Testes nl. DRE - prostate nl for age - smooth & firm w/o nodules. Musculoskeletal: Full ROM all peripheral extremities, joint stability, 5/5 strength, and normal gait. Skin: Warm and dry without rashes, lesions, cyanosis, clubbing or  ecchymosis.  Neuro: Cranial nerves intact, reflexes equal bilaterally. Normal muscle tone, no cerebellar symptoms.  Sensation intact.  Pysch: Alert and oriented X 3 with normal affect, insight and judgment appropriate.   Assessment and Plan  1. Essential hypertension  - Microalbumin / creatinine urine ratio - EKG 12-Lead - [place], RETROPERITNL ABD,  LTD  2. Hyperlipidemia  - Lipid panel - TSH  3. Prediabetes  - Hemoglobin A1c - Insulin, random  4. Vitamin D deficiency  - VITAMIN D 25 Hydroxy  5. Gastroesophageal reflux disease  6. Screening for rectal cancer  - POC Hemoccult Bld/Stl  7. Prostate cancer ([place])  - PSA  8. Medication management  - Urinalysis, Routine w reflex microscopic  - CBC with Differential/Platelet - BASIC METABOLIC PANEL WITH GFR - Hepatic function panel - Magnesium   Continue prudent diet as discussed, weight control, BP  monitoring, regular exercise, and medications as discussed.  Discussed med effects and SE's. Routine screening labs and tests as requested with regular follow-up as recommended. Over 40 minutes of exam, counseling, chart review and high complex critical decision making was performed

## 2016-06-03 NOTE — Patient Instructions (Signed)

## 2016-06-04 LAB — HEPATIC FUNCTION PANEL
ALBUMIN: 4.5 g/dL (ref 3.6–5.1)
ALK PHOS: 68 U/L (ref 40–115)
ALT: 26 U/L (ref 9–46)
AST: 32 U/L (ref 10–35)
Bilirubin, Direct: 0.1 mg/dL (ref <=0.2)
Indirect Bilirubin: 0.4 mg/dL (ref 0.2–1.2)
TOTAL PROTEIN: 7 g/dL (ref 6.1–8.1)
Total Bilirubin: 0.5 mg/dL (ref 0.2–1.2)

## 2016-06-04 LAB — LIPID PANEL
CHOL/HDL RATIO: 3.2 ratio (ref <=5.0)
CHOLESTEROL: 158 mg/dL (ref 125–200)
HDL: 50 mg/dL (ref >=40)
LDL Cholesterol: 85 mg/dL (ref <130)
TRIGLYCERIDES: 116 mg/dL (ref <150)
VLDL: 23 mg/dL (ref <30)

## 2016-06-04 LAB — URINALYSIS, ROUTINE W REFLEX MICROSCOPIC
BILIRUBIN URINE: NEGATIVE
GLUCOSE, UA: NEGATIVE
Hgb urine dipstick: NEGATIVE
Leukocytes, UA: NEGATIVE
Nitrite: NEGATIVE
PH: 5.5 (ref 5.0–8.0)
Specific Gravity, Urine: 1.028 (ref 1.001–1.035)

## 2016-06-04 LAB — MICROALBUMIN / CREATININE URINE RATIO
Creatinine, Urine: 293 mg/dL (ref 20–370)
Microalb Creat Ratio: 39 ug/mg{creat} — ABNORMAL HIGH
Microalb, Ur: 11.5 mg/dL

## 2016-06-04 LAB — BASIC METABOLIC PANEL WITH GFR
BUN: 19 mg/dL (ref 7–25)
CO2: 25 mmol/L (ref 20–31)
Calcium: 10 mg/dL (ref 8.6–10.3)
Chloride: 106 mmol/L (ref 98–110)
Creat: 1.15 mg/dL (ref 0.70–1.18)
GFR, EST AFRICAN AMERICAN: 70 mL/min (ref >=60)
GFR, Est Non African American: 61 mL/min (ref >=60)
GLUCOSE: 83 mg/dL (ref 65–99)
POTASSIUM: 5.6 mmol/L — AB (ref 3.5–5.3)
Sodium: 146 mmol/L (ref 135–146)

## 2016-06-04 LAB — INSULIN, RANDOM: Insulin: 7.9 u[IU]/mL (ref 2.0–19.6)

## 2016-06-04 LAB — TSH: TSH: 4.17 mIU/L (ref 0.40–4.50)

## 2016-06-04 LAB — VITAMIN D 25 HYDROXY (VIT D DEFICIENCY, FRACTURES): Vit D, 25-Hydroxy: 64 ng/mL (ref 30–100)

## 2016-06-04 LAB — MAGNESIUM: Magnesium: 2.3 mg/dL (ref 1.5–2.5)

## 2016-06-04 LAB — PSA: PSA: 0.01 ng/mL (ref <=4.00)

## 2016-06-07 ENCOUNTER — Encounter: Payer: Self-pay | Admitting: Internal Medicine

## 2016-06-12 ENCOUNTER — Other Ambulatory Visit: Payer: Self-pay | Admitting: Internal Medicine

## 2016-07-12 ENCOUNTER — Other Ambulatory Visit: Payer: Self-pay | Admitting: *Deleted

## 2016-07-12 DIAGNOSIS — Z1212 Encounter for screening for malignant neoplasm of rectum: Secondary | ICD-10-CM

## 2016-07-12 LAB — POC HEMOCCULT BLD/STL (HOME/3-CARD/SCREEN)
Card #2 Fecal Occult Blod, POC: NEGATIVE
Card #3 Fecal Occult Blood, POC: NEGATIVE
FECAL OCCULT BLD: NEGATIVE

## 2016-07-14 ENCOUNTER — Ambulatory Visit (INDEPENDENT_AMBULATORY_CARE_PROVIDER_SITE_OTHER): Payer: Medicare Other | Admitting: Internal Medicine

## 2016-07-14 ENCOUNTER — Encounter: Payer: Self-pay | Admitting: Internal Medicine

## 2016-07-14 VITALS — BP 104/62 | HR 58 | Temp 97.8°F | Resp 16 | Ht 69.5 in | Wt 151.0 lb

## 2016-07-14 DIAGNOSIS — C61 Malignant neoplasm of prostate: Secondary | ICD-10-CM | POA: Diagnosis not present

## 2016-07-14 DIAGNOSIS — Z6822 Body mass index (BMI) 22.0-22.9, adult: Secondary | ICD-10-CM | POA: Diagnosis not present

## 2016-07-14 DIAGNOSIS — E559 Vitamin D deficiency, unspecified: Secondary | ICD-10-CM

## 2016-07-14 DIAGNOSIS — Z0001 Encounter for general adult medical examination with abnormal findings: Secondary | ICD-10-CM

## 2016-07-14 DIAGNOSIS — E785 Hyperlipidemia, unspecified: Secondary | ICD-10-CM

## 2016-07-14 DIAGNOSIS — I10 Essential (primary) hypertension: Secondary | ICD-10-CM

## 2016-07-14 DIAGNOSIS — R7303 Prediabetes: Secondary | ICD-10-CM

## 2016-07-14 DIAGNOSIS — Z0000 Encounter for general adult medical examination without abnormal findings: Secondary | ICD-10-CM

## 2016-07-14 DIAGNOSIS — Z79899 Other long term (current) drug therapy: Secondary | ICD-10-CM | POA: Diagnosis not present

## 2016-07-14 DIAGNOSIS — K219 Gastro-esophageal reflux disease without esophagitis: Secondary | ICD-10-CM

## 2016-07-14 DIAGNOSIS — R6889 Other general symptoms and signs: Secondary | ICD-10-CM

## 2016-07-14 NOTE — Progress Notes (Signed)
MEDICARE ANNUAL WELLNESS VISIT AND FOLLOW UP Assessment:    1. Essential hypertension -well controlled -cont monitoring -diet and exercise as tolerated  2. Gastroesophageal reflux disease, esophagitis presence not specified -avoid trigger   3. Prostate cancer [facility]) -had prostatectomy  4. Hyperlipidemia -cont lipitor -recheck cholesterol in 6 months  5. Vitamin D deficiency -cont supplement  6. Prediabetes -cont diet and exercise -last A1C excellent  7. Medication management -continue biyearly labs  8. Encounter for Medicare annual wellness exam -Due next year  63. Body mass index (BMI) of 22.0-22.9 in adult -Cont Diet and exercise     Over 30 minutes of exam, counseling, chart review, and critical decision making was performed  Future Appointments Date Time Provider [place]  11/24/2016 11:15 AM Unk Pinto, MD [facility] None  07/18/2017 3:00 PM Unk Pinto, MD [facility] None     Plan:   During the course of the visit the patient was educated and counseled about appropriate screening and preventive services including:    Pneumococcal vaccine   Influenza vaccine  Prevnar 13  Td vaccine  Screening electrocardiogram  Colorectal cancer screening  Diabetes screening  Glaucoma screening  Nutrition counseling    Subjective:  Brian Proctor is a 80 y.o. male who presents for Medicare Annual Wellness Visit and 3 month follow up for HTN, hyperlipidemia, prediabetes, and vitamin D Def.   His blood pressure has been controlled at home, today their BP is BP: 104/62 mmHg He does workout. He denies chest pain, shortness of breath, dizziness.   He walks on a regular basis, plays golf, and does yardwork.   He is on cholesterol medication and denies myalgias. His cholesterol is at goal. The cholesterol last visit was:   Lab Results  Component Value Date   CHOL 158 06/03/2016   HDL 50 06/03/2016   LDLCALC 85 06/03/2016   TRIG 116  06/03/2016   CHOLHDL 3.2 06/03/2016   He does have a history of prediabetes which has been well controlled with diet and exercise.   Lab Results  Component Value Date   HGBA1C 5.6 06/03/2016   Last GFR Lab Results  Component Value Date   GFRNONAA 61 06/03/2016     Lab Results  Component Value Date   GFRAA 70 06/03/2016   Patient is on Vitamin D supplement.   Lab Results  Component Value Date   VD25OH 64 06/03/2016     He has no complaints at this time.    Medication Review: Current Outpatient Prescriptions on File Prior to Visit  Medication Sig Dispense Refill  . Ascorbic Acid (VITAMIN C PO) Take by mouth daily.    [name] aspirin 81 MG tablet Take 81 mg by mouth daily.    [name] atorvastatin (LIPITOR) 40 MG tablet Take 1 tablet (40 mg total) by mouth daily. 90 tablet 1  . Cholecalciferol (VITAMIN D PO) Take by mouth daily.    . fish oil-omega-3 fatty acids 1000 MG capsule Take 2 g by mouth daily.    . Multiple Vitamins-Minerals (MULTIVITAMIN PO) Take by mouth daily.    [name] PROCTOZONE-HC 2.5 % rectal cream USE RECTALLY TWICE DAILY AS DIRECTED 30 g 0   No current facility-administered medications on file prior to visit.    Allergies: No Known Allergies  Current Problems (verified) has Hyperlipidemia; Hypertension; GERD (gastroesophageal reflux disease); Prediabetes; Vitamin D deficiency; Medication management; Prostate cancer ([place]); Encounter for Medicare annual wellness exam; and Body mass index (BMI) of 22.0-22.9 in adult on his problem  list.  Screening Tests Immunization History  Administered Date(s) Administered  . DT 09/03/2015  . Influenza, High Dose Seasonal PF 09/27/2014, 09/03/2015  . Pneumococcal Conjugate-13 10/16/2014  . Pneumococcal-Unspecified 02/03/2011  . Td 07/26/2005  . Zoster 12/27/2006    Preventative care: Last colonoscopy: 2015   Prior vaccinations: TD or Tdap: 2016  Influenza: 2016 Pneumococcal: 2012 Prevnar13: 2015 Shingles/Zostavax:  2008  Names of Other Physician/Practitioners you currently use: 1. [facility] here for primary care 2. [facility], eye doctor, last visit 2016 3. [facility], dentist, last visit 2017 Patient Care Team: Unk Pinto, MD as PCP - General (Internal Medicine)  Surgical: He  has past surgical history that includes Tonsillectomy; Prostate surgery (1999 or 2000); Colonoscopy; and Inguinal hernia repair (01/15/2013). Family His family history includes Autism in his son; COPD in his father; Heart disease in his father; Other in his mother. Social history  He reports that he quit smoking about 17 years ago. He has never used smokeless tobacco. He reports that he does not drink alcohol or use illicit drugs.  MEDICARE WELLNESS OBJECTIVES: Physical activity: Current Exercise Habits: Home exercise routine, Type of exercise: walking (golfing), Time (Minutes): 30, Frequency (Times/Week): 7, Weekly Exercise (Minutes/Week): 210, Intensity: Moderate Cardiac risk factors: Cardiac Risk Factors include: advanced age (>16men, >11 women);dyslipidemia;male gender Depression/mood screen:   Depression screen [facility] 2/9 07/14/2016  Decreased Interest 0  Down, Depressed, Hopeless 0  PHQ - 2 Score 0    ADLs:  In your present state of health, do you have any difficulty performing the following activities: 07/14/2016 06/07/2016  Hearing? N N  Vision? N N  Difficulty concentrating or making decisions? N N  Walking or climbing stairs? N N  Dressing or bathing? N N  Doing errands, shopping? - Scientist, forensic and eating ? N -  Using the Toilet? N -  In the past six months, have you accidently leaked urine? Y -  Do you have problems with loss of bowel control? N -  Managing your Medications? N -  Managing your Finances? N -  Housekeeping or managing your Housekeeping? N -     Cognitive Testing  Alert? Yes  Normal Appearance?Yes  Oriented to person? Yes  Place?  Yes   Time? Yes  Recall of three objects?  Yes  Can perform simple calculations? Yes  Displays appropriate judgment?Yes  Can read the correct time from a watch face?Yes  EOL planning: Does patient have an advance directive?: Yes Type of Advance Directive: Healthcare Power of Attorney, Living will Does patient want to make changes to advanced directive?: No - Patient declined Copy of advanced directive(s) in chart?: No - copy requested   Objective:   Today's Vitals   07/14/16 1024  BP: 104/62  Pulse: 58  Temp: 97.8 F (36.6 C)  TempSrc: Temporal  Resp: 16  Height: 5' 9.5" (1.765 m)  Weight: 151 lb (68.493 kg)   Body mass index is 21.99 kg/(m^2).  General appearance: alert, no distress, WD/WN, male HEENT: normocephalic, sclerae anicteric, TMs pearly, nares patent, no discharge or erythema, pharynx normal Oral cavity: MMM, no lesions Neck: supple, no lymphadenopathy, no thyromegaly, no masses Heart: RRR, normal S1, S2, no murmurs Lungs: CTA bilaterally, no wheezes, rhonchi, or rales Abdomen: +bs, soft, non tender, non distended, no masses, no hepatomegaly, no splenomegaly Musculoskeletal: nontender, no swelling, no obvious deformity Extremities: no edema, no cyanosis, no clubbing Pulses: 2+ symmetric, upper and lower extremities, normal cap  refill Neurological: alert, oriented x 3, CN2-12 intact, strength normal upper extremities and lower extremities, sensation normal throughout, DTRs 2+ throughout, no cerebellar signs, gait normal Psychiatric: normal affect, behavior normal, pleasant   Medicare Attestation I have personally reviewed: The patient's medical and social history Their use of alcohol, tobacco or illicit drugs Their current medications and supplements The patient's functional ability including ADLs,fall risks, home safety risks, cognitive, and hearing and visual impairment Diet and physical activities Evidence for depression or mood disorders  The patient's  weight, height, BMI, and visual acuity have been recorded in the chart.  I have made referrals, counseling, and provided education to the patient based on review of the above and I have provided the patient with a written personalized care plan for preventive services.     Starlyn Skeans, PA-C   07/14/2016

## 2016-07-27 ENCOUNTER — Other Ambulatory Visit: Payer: Self-pay | Admitting: Internal Medicine

## 2016-08-09 ENCOUNTER — Other Ambulatory Visit: Payer: Self-pay | Admitting: Internal Medicine

## 2016-09-08 DIAGNOSIS — D485 Neoplasm of uncertain behavior of skin: Secondary | ICD-10-CM | POA: Diagnosis not present

## 2016-09-08 DIAGNOSIS — Z85828 Personal history of other malignant neoplasm of skin: Secondary | ICD-10-CM | POA: Diagnosis not present

## 2016-09-08 DIAGNOSIS — L570 Actinic keratosis: Secondary | ICD-10-CM | POA: Diagnosis not present

## 2016-09-08 DIAGNOSIS — L821 Other seborrheic keratosis: Secondary | ICD-10-CM | POA: Diagnosis not present

## 2016-09-08 DIAGNOSIS — L720 Epidermal cyst: Secondary | ICD-10-CM | POA: Diagnosis not present

## 2016-10-25 ENCOUNTER — Other Ambulatory Visit: Payer: Self-pay | Admitting: Internal Medicine

## 2016-10-28 DIAGNOSIS — Z23 Encounter for immunization: Secondary | ICD-10-CM | POA: Diagnosis not present

## 2016-11-24 ENCOUNTER — Ambulatory Visit: Payer: Self-pay | Admitting: Internal Medicine

## 2017-01-05 DIAGNOSIS — H2513 Age-related nuclear cataract, bilateral: Secondary | ICD-10-CM | POA: Diagnosis not present

## 2017-01-19 ENCOUNTER — Ambulatory Visit (INDEPENDENT_AMBULATORY_CARE_PROVIDER_SITE_OTHER): Payer: Medicare Other | Admitting: Internal Medicine

## 2017-01-19 ENCOUNTER — Encounter: Payer: Self-pay | Admitting: Internal Medicine

## 2017-01-19 VITALS — BP 116/74 | HR 60 | Temp 97.5°F | Resp 16 | Ht 69.5 in | Wt 157.6 lb

## 2017-01-19 DIAGNOSIS — E782 Mixed hyperlipidemia: Secondary | ICD-10-CM | POA: Diagnosis not present

## 2017-01-19 DIAGNOSIS — E559 Vitamin D deficiency, unspecified: Secondary | ICD-10-CM

## 2017-01-19 DIAGNOSIS — R7303 Prediabetes: Secondary | ICD-10-CM | POA: Diagnosis not present

## 2017-01-19 DIAGNOSIS — Z79899 Other long term (current) drug therapy: Secondary | ICD-10-CM

## 2017-01-19 DIAGNOSIS — I10 Essential (primary) hypertension: Secondary | ICD-10-CM | POA: Diagnosis not present

## 2017-01-19 DIAGNOSIS — K219 Gastro-esophageal reflux disease without esophagitis: Secondary | ICD-10-CM | POA: Diagnosis not present

## 2017-01-19 LAB — BASIC METABOLIC PANEL WITH GFR
BUN: 15 mg/dL (ref 7–25)
CHLORIDE: 104 mmol/L (ref 98–110)
CO2: 28 mmol/L (ref 20–31)
CREATININE: 1.04 mg/dL (ref 0.70–1.18)
Calcium: 9.6 mg/dL (ref 8.6–10.3)
GFR, Est African American: 79 mL/min (ref >=60)
GFR, Est Non African American: 68 mL/min (ref >=60)
GLUCOSE: 77 mg/dL (ref 65–99)
POTASSIUM: 4.6 mmol/L (ref 3.5–5.3)
Sodium: 140 mmol/L (ref 135–146)

## 2017-01-19 LAB — CBC WITH DIFFERENTIAL/PLATELET
BASOS ABS: 85 {cells}/uL (ref 0–200)
BASOS PCT: 1 %
EOS ABS: 425 {cells}/uL (ref 15–500)
Eosinophils Relative: 5 %
HEMATOCRIT: 47.7 % (ref 38.5–50.0)
HEMOGLOBIN: 15.8 g/dL (ref 13.2–17.1)
LYMPHS ABS: 2295 {cells}/uL (ref 850–3900)
Lymphocytes Relative: 27 %
MCH: 30.7 pg (ref 27.0–33.0)
MCHC: 33.1 g/dL (ref 32.0–36.0)
MCV: 92.8 fL (ref 80.0–100.0)
MONO ABS: 680 {cells}/uL (ref 200–950)
MPV: 9.9 fL (ref 7.5–12.5)
Monocytes Relative: 8 %
NEUTROS ABS: 5015 {cells}/uL (ref 1500–7800)
Neutrophils Relative %: 59 %
PLATELETS: 262 10*3/uL (ref 140–400)
RBC: 5.14 MIL/uL (ref 4.20–5.80)
RDW: 13.9 % (ref 11.0–15.0)
WBC: 8.5 10*3/uL (ref 3.8–10.8)

## 2017-01-19 LAB — HEPATIC FUNCTION PANEL
ALBUMIN: 4.1 g/dL (ref 3.6–5.1)
ALK PHOS: 70 U/L (ref 40–115)
ALT: 25 U/L (ref 9–46)
AST: 31 U/L (ref 10–35)
BILIRUBIN TOTAL: 0.5 mg/dL (ref 0.2–1.2)
Bilirubin, Direct: 0.1 mg/dL (ref <=0.2)
Indirect Bilirubin: 0.4 mg/dL (ref 0.2–1.2)
Total Protein: 7.1 g/dL (ref 6.1–8.1)

## 2017-01-19 LAB — LIPID PANEL
CHOL/HDL RATIO: 3.6 ratio (ref <5.0)
Cholesterol: 160 mg/dL (ref <200)
HDL: 45 mg/dL (ref >40)
LDL CALC: 88 mg/dL (ref <100)
Triglycerides: 133 mg/dL (ref <150)
VLDL: 27 mg/dL (ref <30)

## 2017-01-19 LAB — TSH: TSH: 3.28 m[IU]/L (ref 0.40–4.50)

## 2017-01-19 LAB — MAGNESIUM: Magnesium: 2.3 mg/dL (ref 1.5–2.5)

## 2017-01-19 NOTE — Progress Notes (Signed)
[place] [facility] Unk Pinto, M.D.        [name]. Silverio Lay, P.A.-C       Starlyn Skeans, P.A.-C  [facility]                [address] [place], [place] [id number] Telephone [phone] Telefax [phone] ______________________________________________________________________     This very nice 81 y.o. [name] presents for 6 month follow up with Hypertension, Hyperlipidemia, Pre-Diabetes and Vitamin D Deficiency.      Patient is monitored expectantly for labile HTN (2005) & BP has been controlled at home. Today's BP is at goal - 116/74. Patient has had no complaints of any cardiac type chest pain, palpitations, dyspnea/orthopnea/PND, dizziness, claudication, or dependent edema.     Hyperlipidemia is controlled with diet & meds. Patient denies myalgias or other med SE's. Last Lipids were at goal: Lab Results  Component Value Date   CHOL 158 06/03/2016   HDL 50 06/03/2016   LDLCALC 85 06/03/2016   TRIG 116 06/03/2016   CHOLHDL 3.2 06/03/2016      Also, the patient has history of PreDiabetes with A1c 5.7% circa 2013  and has had no symptoms of reactive hypoglycemia, diabetic polys, paresthesias or visual blurring.  Last A1c was at goal: Lab Results  Component Value Date   HGBA1C 5.6 06/03/2016      Further, the patient also has history of Vitamin D Deficiency in 2013 of "41" and supplements vitamin D without any suspected side-effects. Last vitamin D was at goal: Lab Results  Component Value Date   VD25OH 64 06/03/2016   Current Outpatient Prescriptions on File Prior to Visit  Medication Sig  . Ascorbic Acid (VITAMIN C PO) Take by mouth daily.  [name] aspirin 81 MG tablet Take 81 mg by mouth daily.  [name] atorvastatin (LIPITOR) 40 MG tablet TAKE 1 TABLET BY MOUTH DAILY  . Cholecalciferol (VITAMIN D PO) Take by mouth daily.  . fish oil-omega-3 fatty acids 1000 MG capsule Take 2 g by mouth daily.  .  Multiple Vitamins-Minerals (MULTIVITAMIN PO) Take by mouth daily.  [name] PROCTOZONE-HC 2.5 % rectal cream USE RECTALLY TWICE DAILY AS DIRECTED   No current facility-administered medications on file prior to visit.    No Known Allergies PMHx:   Past Medical History:  Diagnosis Date  . Cancer [facility])    prostate - basal cell  . GERD (gastroesophageal reflux disease)   . Hearing loss    wear hearing aid  . Hyperlipidemia   . Hypertension   . Inguinal hernia   . No pertinent past medical history   . Prediabetes   . Vitamin D deficiency   . Wears glasses   . Wears hearing aid    both ears   Immunization History  Administered Date(s) Administered  . DT 09/03/2015  . Influenza Split 10/28/2016  . Influenza, High Dose Seasonal PF 09/27/2014, 09/03/2015  . Pneumococcal Conjugate-13 10/16/2014  . Pneumococcal-Unspecified 02/03/2011  . Td 07/26/2005  . Zoster 12/27/2006   Past Surgical History:  Procedure Laterality Date  . COLONOSCOPY     x2  . INGUINAL HERNIA REPAIR  01/15/2013   Procedure: HERNIA REPAIR INGUINAL ADULT;  Surgeon: Gwenyth Ober, MD;  Location: [place];  Service: General;  Laterality: Right;  . [place] or 2000   due to cancer  . TONSILLECTOMY  as a child   FHx:    Reviewed / unchanged  SHx:    Reviewed / unchanged  Systems Review:  Constitutional: Denies fever, chills, wt changes, headaches, insomnia, fatigue, night sweats, change in appetite. Eyes: Denies redness, blurred vision, diplopia, discharge, itchy, watery eyes.  ENT: Denies discharge, congestion, post nasal drip, epistaxis, sore throat, earache, hearing loss, dental pain, tinnitus, vertigo, sinus pain, snoring.  CV: Denies chest pain, palpitations, irregular heartbeat, syncope, dyspnea, diaphoresis, orthopnea, PND, claudication or edema. Respiratory: denies cough, dyspnea, DOE, pleurisy, hoarseness, laryngitis, wheezing.  Gastrointestinal: Denies dysphagia,  odynophagia, heartburn, reflux, water brash, abdominal pain or cramps, nausea, vomiting, bloating, diarrhea, constipation, hematemesis, melena, hematochezia  or hemorrhoids. Genitourinary: Denies dysuria, frequency, urgency, nocturia, hesitancy, discharge, hematuria or flank pain. Musculoskeletal: Denies arthralgias, myalgias, stiffness, jt. swelling, pain, limping or strain/sprain.  Skin: Denies pruritus, rash, hives, warts, acne, eczema or change in skin lesion(s). Neuro: No weakness, tremor, incoordination, spasms, paresthesia or pain. Psychiatric: Denies confusion, memory loss or sensory loss. Endo: Denies change in weight, skin or hair change.  Heme/Lymph: No excessive bleeding, bruising or enlarged lymph nodes.  Physical Exam  BP 116/74   Pulse 60   Temp 97.5 F (36.4 C)   Resp 16   Ht 5' 9.5" (1.765 m)   Wt 157 lb 9.6 oz (71.5 kg)   BMI 22.94 kg/m   Appears well nourished and in no distress.  Eyes: PERRLA, EOMs, conjunctiva no swelling or erythema. Sinuses: No frontal/maxillary tenderness ENT/Mouth: EAC's clear, TM's nl w/o erythema, bulging. Nares clear w/o erythema, swelling, exudates. Oropharynx clear without erythema or exudates. Oral hygiene is good. Tongue normal, non obstructing. Hearing intact.  Neck: Supple. Thyroid nl. Car 2+/2+ without bruits, nodes or JVD. Chest: Respirations nl with BS clear & equal w/o rales, rhonchi, wheezing or stridor.  Cor: Heart sounds normal w/ regular rate and rhythm without sig. murmurs, gallops, clicks, or rubs. Peripheral pulses normal and equal  without edema.  Abdomen: Soft & bowel sounds normal. Non-tender w/o guarding, rebound, hernias, masses, or organomegaly.  Lymphatics: Unremarkable.  Musculoskeletal: Full ROM all peripheral extremities, joint stability, 5/5 strength, and normal gait.  Skin: Warm, dry without exposed rashes, lesions or ecchymosis apparent.  Neuro: Cranial nerves intact, reflexes equal bilaterally. Sensory-motor  testing grossly intact. Tendon reflexes grossly intact.  Pysch: Alert & oriented x 3.  Insight and judgement nl & appropriate. No ideations.  Assessment and Plan:  - Continue medication, monitor blood pressure at home.  - Continue DASH diet. Reminder to go to the ER if any CP,  SOB, nausea, dizziness, severe HA, changes vision/speech,  left arm numbness and tingling and jaw pain.  - Continue diet/meds, exercise,& lifestyle modifications.  - Continue monitor periodic cholesterol/liver & renal functions  [place] [facility] Unk Pinto, M.D.        [name]. Silverio Lay, P.A.-C       Starlyn Skeans, P.A.-C  [facility]                [address] [place], [place] [id number] Telephone [phone] Telefax [phone] ______________________________________________________________________     This very nice 81 y.o.male presents for 3 month follow up with Hypertension, Hyperlipidemia, Pre-Diabetes and Vitamin D Deficiency.      Patient is treated for HTN & BP has been controlled at home. Today's BP: 116/74. Patient has had no complaints of  any cardiac type chest pain, palpitations, dyspnea/orthopnea/PND, dizziness, claudication, or dependent edema.     Hyperlipidemia is controlled with diet & meds. Patient denies myalgias or other med SE's. Last Lipids were  Lab Results  Component Value Date   CHOL 158 06/03/2016   HDL 50 06/03/2016   LDLCALC 85 06/03/2016   TRIG 116 06/03/2016   CHOLHDL 3.2 06/03/2016      Also, the patient has history of T2_NIDDM PreDiabetes and has had no symptoms of reactive hypoglycemia, diabetic polys, paresthesias or visual blurring.  Last A1c was  Lab Results  Component Value Date   HGBA1C 5.6 06/03/2016      Further, the patient also has history of Vitamin D Deficiency and supplements vitamin D without any suspected side-effects. Last vitamin D was   Lab Results  Component  Value Date   VD25OH 64 06/03/2016   Current Outpatient Prescriptions on File Prior to Visit  Medication Sig  . Ascorbic Acid (VITAMIN C PO) Take by mouth daily.  [name] aspirin 81 MG tablet Take 81 mg by mouth daily.  [name] atorvastatin (LIPITOR) 40 MG tablet TAKE 1 TABLET BY MOUTH DAILY  . Cholecalciferol (VITAMIN D PO) Take by mouth daily.  . fish oil-omega-3 fatty acids 1000 MG capsule Take 2 g by mouth daily.  . Multiple Vitamins-Minerals (MULTIVITAMIN PO) Take by mouth daily.  [name] PROCTOZONE-HC 2.5 % rectal cream USE RECTALLY TWICE DAILY AS DIRECTED   No current facility-administered medications on file prior to visit.    No Known Allergies PMHx:   Past Medical History:  Diagnosis Date  . Cancer [facility])    prostate - basal cell  . GERD (gastroesophageal reflux disease)   . Hearing loss    wear hearing aid  . Hyperlipidemia   . Hypertension   . Inguinal hernia   . No pertinent past medical history   . Prediabetes   . Vitamin D deficiency   . Wears glasses   . Wears hearing aid    both ears   Immunization History  Administered Date(s) Administered  . DT 09/03/2015  . Influenza Split 10/28/2016  . Influenza, High Dose Seasonal PF 09/27/2014, 09/03/2015  . Pneumococcal Conjugate-13 10/16/2014  . Pneumococcal-Unspecified 02/03/2011  . Td 07/26/2005  . Zoster 12/27/2006   Past Surgical History:  Procedure Laterality Date  . COLONOSCOPY     x2  . INGUINAL HERNIA REPAIR  01/15/2013   Procedure: HERNIA REPAIR INGUINAL ADULT;  Surgeon: Gwenyth Ober, MD;  Location: [place];  Service: General;  Laterality: Right;  . [place] or 2000   due to cancer  . TONSILLECTOMY     as a child   FHx:    Reviewed / unchanged  SHx:    Reviewed / unchanged  Systems Review:  Constitutional: Denies fever, chills, wt changes, headaches, insomnia, fatigue, night sweats, change in appetite. Eyes: Denies redness, blurred vision, diplopia, discharge, itchy, watery  eyes.  ENT: Denies discharge, congestion, post nasal drip, epistaxis, sore throat, earache, hearing loss, dental pain, tinnitus, vertigo, sinus pain, snoring.  CV: Denies chest pain, palpitations, irregular heartbeat, syncope, dyspnea, diaphoresis, orthopnea, PND, claudication or edema. Respiratory: denies cough, dyspnea, DOE, pleurisy, hoarseness, laryngitis, wheezing.  Gastrointestinal: Denies dysphagia, odynophagia, heartburn, reflux, water brash, abdominal pain or cramps, nausea, vomiting, bloating, diarrhea, constipation, hematemesis, melena, hematochezia  or hemorrhoids. Genitourinary: Denies dysuria, frequency, urgency, nocturia, hesitancy, discharge, hematuria or flank pain. Musculoskeletal: Denies arthralgias, myalgias, stiffness, jt. swelling, pain, limping or strain/sprain.  Skin: Denies pruritus, rash, hives, warts, acne, eczema or change in skin lesion(s). Neuro: No weakness, tremor, incoordination, spasms, paresthesia or pain. Psychiatric: Denies confusion, memory loss or sensory loss. Endo: Denies change in weight, skin or hair change.  Heme/Lymph: No excessive bleeding, bruising or enlarged lymph nodes.  Physical Exam  BP 116/74   Pulse 60   Temp 97.5 F (36.4 C)   Resp 16   Ht 5' 9.5" (1.765 m)   Wt 157 lb 9.6 oz (71.5 kg)   BMI 22.94 kg/m   Appears well nourished and in no distress.  Eyes: PERRLA, EOMs, conjunctiva no swelling or erythema. Sinuses: No frontal/maxillary tenderness ENT/Mouth: EAC's clear, TM's nl w/o erythema, bulging. Nares clear w/o erythema, swelling, exudates. Oropharynx clear without erythema or exudates. Oral hygiene is good. Tongue normal, non obstructing. Hearing intact.  Neck: Supple. Thyroid nl. Car 2+/2+ without bruits, nodes or JVD. Chest: Respirations nl with BS clear & equal w/o rales, rhonchi, wheezing or stridor.  Cor: Heart sounds normal w/ regular rate and rhythm without sig. murmurs, gallops, clicks, or rubs. Peripheral pulses normal  and equal  without edema.  Abdomen: Soft & bowel sounds normal. Non-tender w/o guarding, rebound, hernias, masses, or organomegaly.  Lymphatics: Unremarkable.  Musculoskeletal: Full ROM all peripheral extremities, joint stability, 5/5 strength, and normal gait.  Skin: Warm, dry without exposed rashes, lesions or ecchymosis apparent.  Neuro: Cranial nerves intact, reflexes equal bilaterally. Sensory-motor testing grossly intact. Tendon reflexes grossly intact.  Pysch: Alert & oriented x 3.  Insight and judgement nl & appropriate. No ideations.  Assessment and Plan:  1. Essential hypertension  - Continue medication, monitor blood pressure at home.  - Continue DASH diet. Reminder to go to the ER if any CP,  SOB, nausea, dizziness, severe HA, changes vision/speech,  left arm numbness and tingling and jaw pain. - CBC with Differential/Platelet - BASIC METABOLIC PANEL WITH GFR - TSH  2. Mixed hyperlipidemia  - Continue diet/meds, exercise,& lifestyle modifications.  - Continue monitor periodic cholesterol/liver & renal functions  - Hepatic function panel - Lipid panel - TSH  3. Prediabetes  - Continue diet, exercise, lifestyle modifications.  - Monitor appropriate labs. - Hemoglobin A1c - Insulin, random  4. Vitamin D deficiency  - Continue supplementation. - VITAMIN D 25 Hydroxy   5. Gastroesophageal reflux disease   6. Medication management  - CBC with Differential/Platelet - BASIC METABOLIC PANEL WITH GFR - Hepatic function panel - Magnesium - VITAMIN D 25 Hydroxy         Recommended regular exercise, BP monitoring, weight control, and discussed med and SE's. Recommended labs to assess and monitor clinical status. Further disposition pending results of labs. Over 30 minutes of exam, counseling, chart review was performed.

## 2017-01-19 NOTE — Patient Instructions (Signed)

## 2017-01-20 LAB — VITAMIN D 25 HYDROXY (VIT D DEFICIENCY, FRACTURES): Vit D, 25-Hydroxy: 58 ng/mL (ref 30–100)

## 2017-01-20 LAB — INSULIN, RANDOM: INSULIN: 10.1 u[IU]/mL (ref 2.0–19.6)

## 2017-01-20 LAB — HEMOGLOBIN A1C
HEMOGLOBIN A1C: 5.1 % (ref <5.7)
Mean Plasma Glucose: 100 mg/dL

## 2017-01-26 DIAGNOSIS — H90A32 Mixed conductive and sensorineural hearing loss, unilateral, left ear with restricted hearing on the contralateral side: Secondary | ICD-10-CM | POA: Diagnosis not present

## 2017-01-26 DIAGNOSIS — H90A21 Sensorineural hearing loss, unilateral, right ear, with restricted hearing on the contralateral side: Secondary | ICD-10-CM | POA: Diagnosis not present

## 2017-02-14 ENCOUNTER — Other Ambulatory Visit: Payer: Self-pay | Admitting: Internal Medicine

## 2017-03-14 DIAGNOSIS — D692 Other nonthrombocytopenic purpura: Secondary | ICD-10-CM | POA: Diagnosis not present

## 2017-03-14 DIAGNOSIS — L570 Actinic keratosis: Secondary | ICD-10-CM | POA: Diagnosis not present

## 2017-03-14 DIAGNOSIS — L820 Inflamed seborrheic keratosis: Secondary | ICD-10-CM | POA: Diagnosis not present

## 2017-03-14 DIAGNOSIS — Z85828 Personal history of other malignant neoplasm of skin: Secondary | ICD-10-CM | POA: Diagnosis not present

## 2017-03-14 DIAGNOSIS — L821 Other seborrheic keratosis: Secondary | ICD-10-CM | POA: Diagnosis not present

## 2017-04-22 NOTE — Progress Notes (Signed)
Patient ID: Brian Proctor, male   DOB: 01-Apr-1936, 81 y.o.   MRN: [id number]  Assessment and Plan:  Hypertension:  -Continue medication,  -monitor blood pressure at home.  -Continue DASH diet.   -Reminder to go to the ER if any CP, SOB, nausea, dizziness, severe HA, changes vision/speech, left arm numbness and tingling, and jaw pain.  Cholesterol: -Continue diet and exercise.  -Check cholesterol.   Pre-diabetes: -Continue diet and exercise.  -Check A1C  Vitamin D Def: -check level -continue medications.   Prostate cancer [facility]) Continue follow up urologist PRN s/p surgery  Continue diet and meds as discussed. Further disposition pending results of labs. Future Appointments Date Time Provider [place]  04/25/2017 10:30 AM Vicie Mutters, PA-C [facility] None  08/03/2017 3:00 PM Unk Pinto, MD [facility] None     HPI 81 y.o. male  presents for 3 month follow up with hypertension, hyperlipidemia, prediabetes and vitamin D.   His blood pressure has been controlled at home, today their BP is BP: 130/72.   He does workout. He denies chest pain, shortness of breath, dizziness.  Patient reports that he has been golfing and has been working out in the yard.  Sees Derm 2 x a year.    He is on cholesterol medication, He has been taking 1/2 tablet every other day.  He would ideally like to not take it at all and denies myalgias. His cholesterol is at goal. The cholesterol last visit was:   Lab Results  Component Value Date   CHOL 160 01/19/2017   HDL 45 01/19/2017   LDLCALC 88 01/19/2017   TRIG 133 01/19/2017   CHOLHDL 3.6 01/19/2017     He has been working on diet and exercise for prediabetes, and denies foot ulcerations, hyperglycemia, hypoglycemia , increased appetite, nausea, paresthesia of the feet, polydipsia, polyuria, visual disturbances, vomiting and weight loss. Last A1C in the office was:  Lab Results  Component Value Date   HGBA1C 5.1 01/19/2017   Patient  is on Vitamin D supplement.  Lab Results  Component Value Date   VD25OH 58 01/19/2017     BMI is Body mass index is 24.02 kg/m., he is working on diet and exercise. Wt Readings from Last 3 Encounters:  04/25/17 165 lb (74.8 kg)  01/19/17 157 lb 9.6 oz (71.5 kg)  07/14/16 151 lb (68.5 kg)    Current Medications:  Current Outpatient Prescriptions on File Prior to Visit  Medication Sig Dispense Refill  . Ascorbic Acid (VITAMIN C PO) Take by mouth daily.    [name] aspirin 81 MG tablet Take 81 mg by mouth daily.    [name] atorvastatin (LIPITOR) 40 MG tablet TAKE 1 TABLET BY MOUTH DAILY 90 tablet 1  . Cholecalciferol (VITAMIN D PO) Take by mouth daily.    . fish oil-omega-3 fatty acids 1000 MG capsule Take 2 g by mouth daily.    . Multiple Vitamins-Minerals (MULTIVITAMIN PO) Take by mouth daily.    [name] PROCTOZONE-HC 2.5 % rectal cream USE RECTALLY TWICE DAILY AS DIRECTED 30 g 3   No current facility-administered medications on file prior to visit.     Medical History:  Past Medical History:  Diagnosis Date  . Cancer [facility])    prostate - basal cell  . GERD (gastroesophageal reflux disease)   . Hearing loss    wear hearing aid  . Hyperlipidemia   . Hypertension   . Inguinal hernia   . No pertinent past medical history   .  Prediabetes   . Vitamin D deficiency   . Wears glasses   . Wears hearing aid    both ears    Allergies: No Known Allergies   Review of Systems:  Review of Systems  Constitutional: Negative for chills, fever and malaise/fatigue.  HENT: Negative for congestion, ear pain and sore throat.   Eyes: Negative.   Respiratory: Negative for cough, shortness of breath and wheezing.   Cardiovascular: Negative for chest pain, palpitations and leg swelling.  Gastrointestinal: Negative for blood in stool, constipation, diarrhea, heartburn and melena.  Genitourinary: Negative.   Neurological: Negative for dizziness, seizures and headaches.  Psychiatric/Behavioral: Negative for  depression. The patient is not nervous/anxious and does not have insomnia.     Family history- Review and unchanged  Social history- Review and unchanged  Physical Exam: BP 130/72   Temp 97.9 F (36.6 C)   Resp 14   Ht 5' 9.5" (1.765 m)   Wt 165 lb (74.8 kg)   BMI 24.02 kg/m  Wt Readings from Last 3 Encounters:  04/25/17 165 lb (74.8 kg)  01/19/17 157 lb 9.6 oz (71.5 kg)  07/14/16 151 lb (68.5 kg)    General Appearance: Well nourished well developed, in no apparent distress. Eyes: PERRLA, EOMs, conjunctiva no swelling or erythema ENT/Mouth: Ear canals normal without obstruction, swelling, erythma, discharge.  TMs normal bilaterally.  Oropharynx moist, clear, without exudate, or postoropharyngeal swelling. Neck: Supple, thyroid normal,no cervical adenopathy  Respiratory: Respiratory effort normal, Breath sounds clear A&P without rhonchi, wheeze, or rale.  No retractions, no accessory usage. Cardio: RRR with no MRGs. Brisk peripheral pulses without edema.  Abdomen: Soft, + BS,  Non tender, no guarding, rebound, hernias, masses. Musculoskeletal: Full ROM, 5/5 strength, Normal gait Skin: Warm, dry without rashes, lesions, ecchymosis.  Neuro: Awake and oriented X 3, Cranial nerves intact. Normal muscle tone, no cerebellar symptoms. Psych: Normal affect, Insight and Judgment appropriate.    Vicie Mutters, PA-C 10:27 AM [facility]

## 2017-04-25 ENCOUNTER — Encounter: Payer: Self-pay | Admitting: Physician Assistant

## 2017-04-25 ENCOUNTER — Ambulatory Visit (INDEPENDENT_AMBULATORY_CARE_PROVIDER_SITE_OTHER): Payer: Medicare Other | Admitting: Physician Assistant

## 2017-04-25 VITALS — BP 130/72 | Temp 97.9°F | Resp 14 | Ht 69.5 in | Wt 165.0 lb

## 2017-04-25 DIAGNOSIS — I10 Essential (primary) hypertension: Secondary | ICD-10-CM

## 2017-04-25 DIAGNOSIS — E782 Mixed hyperlipidemia: Secondary | ICD-10-CM

## 2017-04-25 DIAGNOSIS — Z79899 Other long term (current) drug therapy: Secondary | ICD-10-CM

## 2017-04-25 DIAGNOSIS — E559 Vitamin D deficiency, unspecified: Secondary | ICD-10-CM | POA: Diagnosis not present

## 2017-04-25 DIAGNOSIS — R7303 Prediabetes: Secondary | ICD-10-CM

## 2017-04-25 DIAGNOSIS — C61 Malignant neoplasm of prostate: Secondary | ICD-10-CM

## 2017-04-25 NOTE — Patient Instructions (Signed)

## 2017-04-26 LAB — LIPID PANEL
Cholesterol: 150 mg/dL (ref <200)
HDL: 43 mg/dL (ref >40)
LDL CALC: 79 mg/dL (ref <100)
Total CHOL/HDL Ratio: 3.5 Ratio (ref <5.0)
Triglycerides: 140 mg/dL (ref <150)
VLDL: 28 mg/dL (ref <30)

## 2017-04-26 LAB — CBC WITH DIFFERENTIAL/PLATELET
BASOS PCT: 1 %
Basophils Absolute: 88 cells/uL (ref 0–200)
Eosinophils Absolute: 616 cells/uL — ABNORMAL HIGH (ref 15–500)
Eosinophils Relative: 7 %
HCT: 45 % (ref 38.5–50.0)
Hemoglobin: 15 g/dL (ref 13.2–17.1)
LYMPHS PCT: 26 %
Lymphs Abs: 2288 cells/uL (ref 850–3900)
MCH: 30.8 pg (ref 27.0–33.0)
MCHC: 33.3 g/dL (ref 32.0–36.0)
MCV: 92.4 fL (ref 80.0–100.0)
MONOS PCT: 10 %
MPV: 9.9 fL (ref 7.5–12.5)
Monocytes Absolute: 880 cells/uL (ref 200–950)
Neutro Abs: 4928 cells/uL (ref 1500–7800)
Neutrophils Relative %: 56 %
PLATELETS: 265 10*3/uL (ref 140–400)
RBC: 4.87 MIL/uL (ref 4.20–5.80)
RDW: 13.8 % (ref 11.0–15.0)
WBC: 8.8 10*3/uL (ref 3.8–10.8)

## 2017-04-26 LAB — BASIC METABOLIC PANEL WITH GFR
BUN: 17 mg/dL (ref 7–25)
CO2: 28 mmol/L (ref 20–31)
Calcium: 9.3 mg/dL (ref 8.6–10.3)
Chloride: 106 mmol/L (ref 98–110)
Creat: 1.08 mg/dL (ref 0.70–1.18)
GFR, EST NON AFRICAN AMERICAN: 65 mL/min (ref >=60)
GFR, Est African American: 75 mL/min (ref >=60)
Glucose, Bld: 72 mg/dL (ref 65–99)
POTASSIUM: 5.2 mmol/L (ref 3.5–5.3)
Sodium: 142 mmol/L (ref 135–146)

## 2017-04-26 LAB — TSH: TSH: 2.76 m[IU]/L (ref 0.40–4.50)

## 2017-04-26 LAB — HEPATIC FUNCTION PANEL
ALBUMIN: 4 g/dL (ref 3.6–5.1)
ALK PHOS: 69 U/L (ref 40–115)
ALT: 23 U/L (ref 9–46)
AST: 26 U/L (ref 10–35)
BILIRUBIN TOTAL: 0.6 mg/dL (ref 0.2–1.2)
Bilirubin, Direct: 0.1 mg/dL (ref <=0.2)
Indirect Bilirubin: 0.5 mg/dL (ref 0.2–1.2)
Total Protein: 6.8 g/dL (ref 6.1–8.1)

## 2017-07-18 ENCOUNTER — Encounter: Payer: Self-pay | Admitting: Internal Medicine

## 2017-08-03 ENCOUNTER — Other Ambulatory Visit: Payer: Self-pay | Admitting: Internal Medicine

## 2017-08-03 ENCOUNTER — Encounter: Payer: Self-pay | Admitting: Internal Medicine

## 2017-08-03 ENCOUNTER — Ambulatory Visit (INDEPENDENT_AMBULATORY_CARE_PROVIDER_SITE_OTHER): Payer: Medicare Other | Admitting: Internal Medicine

## 2017-08-03 VITALS — BP 140/76 | HR 56 | Temp 97.8°F | Resp 16 | Ht 69.75 in | Wt 155.2 lb

## 2017-08-03 DIAGNOSIS — Z79899 Other long term (current) drug therapy: Secondary | ICD-10-CM

## 2017-08-03 DIAGNOSIS — Z125 Encounter for screening for malignant neoplasm of prostate: Secondary | ICD-10-CM

## 2017-08-03 DIAGNOSIS — Z136 Encounter for screening for cardiovascular disorders: Secondary | ICD-10-CM | POA: Diagnosis not present

## 2017-08-03 DIAGNOSIS — E559 Vitamin D deficiency, unspecified: Secondary | ICD-10-CM | POA: Diagnosis not present

## 2017-08-03 DIAGNOSIS — R7303 Prediabetes: Secondary | ICD-10-CM | POA: Diagnosis not present

## 2017-08-03 DIAGNOSIS — E782 Mixed hyperlipidemia: Secondary | ICD-10-CM

## 2017-08-03 DIAGNOSIS — R7309 Other abnormal glucose: Secondary | ICD-10-CM | POA: Diagnosis not present

## 2017-08-03 DIAGNOSIS — Z8546 Personal history of malignant neoplasm of prostate: Secondary | ICD-10-CM

## 2017-08-03 DIAGNOSIS — R739 Hyperglycemia, unspecified: Secondary | ICD-10-CM | POA: Diagnosis not present

## 2017-08-03 DIAGNOSIS — Z1212 Encounter for screening for malignant neoplasm of rectum: Secondary | ICD-10-CM

## 2017-08-03 DIAGNOSIS — I10 Essential (primary) hypertension: Secondary | ICD-10-CM

## 2017-08-03 DIAGNOSIS — K219 Gastro-esophageal reflux disease without esophagitis: Secondary | ICD-10-CM

## 2017-08-03 LAB — CBC WITH DIFFERENTIAL/PLATELET
BASOS ABS: 0 {cells}/uL (ref 0–200)
Basophils Relative: 0 %
EOS ABS: 445 {cells}/uL (ref 15–500)
Eosinophils Relative: 5 %
HEMATOCRIT: 47.5 % (ref 38.5–50.0)
Hemoglobin: 15.7 g/dL (ref 13.2–17.1)
LYMPHS PCT: 28 %
Lymphs Abs: 2492 cells/uL (ref 850–3900)
MCH: 30.6 pg (ref 27.0–33.0)
MCHC: 33.1 g/dL (ref 32.0–36.0)
MCV: 92.6 fL (ref 80.0–100.0)
MONO ABS: 801 {cells}/uL (ref 200–950)
MONOS PCT: 9 %
MPV: 9.9 fL (ref 7.5–12.5)
Neutro Abs: 5162 cells/uL (ref 1500–7800)
Neutrophils Relative %: 58 %
PLATELETS: 309 10*3/uL (ref 140–400)
RBC: 5.13 MIL/uL (ref 4.20–5.80)
RDW: 13.7 % (ref 11.0–15.0)
WBC: 8.9 10*3/uL (ref 3.8–10.8)

## 2017-08-03 LAB — TSH: TSH: 2.84 m[IU]/L (ref 0.40–4.50)

## 2017-08-03 NOTE — Patient Instructions (Signed)
,Preventive Care for Adults  A healthy lifestyle and preventive care can promote health and wellness. Preventive health guidelines for men include the following key practices:  A routine yearly physical is a good way to check with your health care provider about your health and preventative screening. It is a chance to share any concerns and updates on your health and to receive a thorough exam.  Visit your dentist for a routine exam and preventative care every 6 months. Brush your teeth twice a day and floss once a day. Good oral hygiene prevents tooth decay and gum disease.  The frequency of eye exams is based on your age, health, family medical history, use of contact lenses, and other factors. Follow your health care provider's recommendations for frequency of eye exams.  Eat a healthy diet. Foods such as vegetables, fruits, whole grains, low-fat dairy products, and lean protein foods contain the nutrients you need without too many calories. Decrease your intake of foods high in solid fats, added sugars, and salt. Eat the right amount of calories for you.Get information about a proper diet from your health care provider, if necessary.  Regular physical exercise is one of the most important things you can do for your health. Most adults should get at least 150 minutes of moderate-intensity exercise (any activity that increases your heart rate and causes you to sweat) each week. In addition, most adults need muscle-strengthening exercises on 2 or more days a week.  Maintain a healthy weight. The body mass index (BMI) is a screening tool to identify possible weight problems. It provides an estimate of body fat based on height and weight. Your health care provider can find your BMI and can help you achieve or maintain a healthy weight.For adults 20 years and older:  A BMI below 18.5 is considered underweight.  A BMI of 18.5 to 24.9 is normal.  A BMI of 25 to 29.9 is considered overweight.  A  BMI of 30 and above is considered obese.  Maintain normal blood lipids and cholesterol levels by exercising and minimizing your intake of saturated fat. Eat a balanced diet with plenty of fruit and vegetables. Blood tests for lipids and cholesterol should begin at age 66 and be repeated every 5 years. If your lipid or cholesterol levels are high, you are over 50, or you are at high risk for heart disease, you may need your cholesterol levels checked more frequently.Ongoing high lipid and cholesterol levels should be treated with medicines if diet and exercise are not working.  If you smoke, find out from your health care provider how to quit. If you do not use tobacco, do not start.  Lung cancer screening is recommended for adults aged 54-80 years who are at high risk for developing lung cancer because of a history of smoking. A yearly low-dose CT scan of the lungs is recommended for people who have at least a 30-pack-year history of smoking and are a current smoker or have quit within the past 15 years. A pack year of smoking is smoking an average of 1 pack of cigarettes a day for 1 year (for example: 1 pack a day for 30 years or 2 packs a day for 15 years). Yearly screening should continue until the smoker has stopped smoking for at least 15 years. Yearly screening should be stopped for people who develop a health problem that would prevent them from having lung cancer treatment.  If you choose to drink alcohol, do not have more  than 2 drinks per day. One drink is considered to be 12 ounces (355 mL) of beer, 5 ounces (148 mL) of wine, or 1.5 ounces (44 mL) of liquor.  Avoid use of street drugs. Do not share needles with anyone. Ask for help if you need support or instructions about stopping the use of drugs.  High blood pressure causes heart disease and increases the risk of stroke. Your blood pressure should be checked at least every 1-2 years. Ongoing high blood pressure should be treated with  medicines, if weight loss and exercise are not effective.  If you are 45-79 years old, ask your health care provider if you should take aspirin to prevent heart disease.  Diabetes screening involves taking a blood sample to check your fasting blood sugar level. Testing should be considered at a younger age or be carried out more frequently if you are overweight and have at least 1 risk factor for diabetes.  Colorectal cancer can be detected and often prevented. Most routine colorectal cancer screening begins at the age of 50 and continues through age 75. However, your health care provider may recommend screening at an earlier age if you have risk factors for colon cancer. On a yearly basis, your health care provider may provide home test kits to check for hidden blood in the stool. Use of a small camera at the end of a tube to directly examine the colon (sigmoidoscopy or colonoscopy) can detect the earliest forms of colorectal cancer. Talk to your health care provider about this at age 50, when routine screening begins. Direct exam of the colon should be repeated every 5-10 years through age 75, unless early forms of precancerous polyps or small growths are found.  Hepatitis C blood testing is recommended for all people born from 1945 through 1965 and any individual with known risks for hepatitis C.  Screening for abdominal aortic aneurysm (AAA)  by ultrasound is recommended for people who have history of high blood pressure or who are current or former smokers.  Healthy men should  receive prostate-specific antigen (PSA) blood tests as part of routine cancer screening. Talk with your health care provider about prostate cancer screening.  Testicular cancer screening is  recommended for adult males. Screening includes self-exam, a health care provider exam, and other screening tests. Consult with your health care provider about any symptoms you have or any concerns you have about testicular  cancer.  Use sunscreen. Apply sunscreen liberally and repeatedly throughout the day. You should seek shade when your shadow is shorter than you. Protect yourself by wearing long sleeves, pants, a wide-brimmed hat, and sunglasses year round, whenever you are outdoors.  Once a month, do a whole-body skin exam, using a mirror to look at the skin on your back. Tell your health care provider about new moles, moles that have irregular borders, moles that are larger than a pencil eraser, or moles that have changed in shape or color.  Stay current with required vaccines (immunizations).  Influenza vaccine. All adults should be immunized every year.  Tetanus, diphtheria, and acellular pertussis (Td, Tdap) vaccine. An adult who has not previously received Tdap or who does not know his vaccine status should receive 1 dose of Tdap. This initial dose should be followed by tetanus and diphtheria toxoids (Td) booster doses every 10 years. Adults with an unknown or incomplete history of completing a 3-dose immunization series with Td-containing vaccines should begin or complete a primary immunization series including a Tdap dose. Adults   should receive a Td booster every 10 years.  Zoster vaccine. One dose is recommended for adults aged 60 years or older unless certain conditions are present.    PREVNAR - Pneumococcal 13-valent conjugate (PCV13) vaccine. When indicated, a person who is uncertain of his immunization history and has no record of immunization should receive the PCV13 vaccine. An adult aged 19 years or older who has certain medical conditions and has not been previously immunized should receive 1 dose of PCV13 vaccine. This PCV13 should be followed with a dose of pneumococcal polysaccharide (PPSV23) vaccine. The PPSV23 vaccine dose should be obtained at least 8 weeks after the dose of PCV13 vaccine. An adult aged 19 years or older who has certain medical conditions and previously received 1 or more doses  of PPSV23 vaccine should receive 1 dose of PCV13. The PCV13 vaccine dose should be obtained 1 or more years after the last PPSV23 vaccine dose.    PNEUMOVAX - Pneumococcal polysaccharide (PPSV23) vaccine. When PCV13 is also indicated, PCV13 should be obtained first. All adults aged 65 years and older should be immunized. An adult younger than age 65 years who has certain medical conditions should be immunized. Any person who resides in a nursing home or long-term care facility should be immunized. An adult smoker should be immunized. People with an immunocompromised condition and certain other conditions should receive both PCV13 and PPSV23 vaccines. People with human immunodeficiency virus (HIV) infection should be immunized as soon as possible after diagnosis. Immunization during chemotherapy or radiation therapy should be avoided. Routine use of PPSV23 vaccine is not recommended for American Indians, [place] Natives, or people younger than 65 years unless there are medical conditions that require PPSV23 vaccine. When indicated, people who have unknown immunization and have no record of immunization should receive PPSV23 vaccine. One-time revaccination 5 years after the first dose of PPSV23 is recommended for people aged 19-64 years who have chronic kidney failure, nephrotic syndrome, asplenia, or immunocompromised conditions. People who received 1-2 doses of PPSV23 before age 65 years should receive another dose of PPSV23 vaccine at age 65 years or later if at least 5 years have passed since the previous dose. Doses of PPSV23 are not needed for people immunized with PPSV23 at or after age 65 years.    Hepatitis A vaccine. Adults who wish to be protected from this disease, have certain high-risk conditions, work with hepatitis A-infected animals, work in hepatitis A research labs, or travel to or work in countries with a high rate of hepatitis A should be immunized. Adults who were previously unvaccinated  and who anticipate close contact with an international adoptee during the first 60 days after arrival in the [place] from a country with a high rate of hepatitis A should be immunized.    Hepatitis B vaccine. Adults should be immunized if they wish to be protected from this disease, have certain high-risk conditions, may be exposed to blood or other infectious body fluids, are household contacts or sex partners of hepatitis B positive people, are clients or workers in certain care facilities, or travel to or work in countries with a high rate of hepatitis B.   Preventive Service / Frequency   Ages 65 and over  Blood pressure check.  Lipid and cholesterol check.  Lung cancer screening. / Every year if you are aged 55-80 years and have a 30-pack-year history of smoking and currently smoke or have quit within the past 15 years. Yearly screening is stopped once   you have quit smoking for at least 15 years or develop a health problem that would prevent you from having lung cancer treatment.  Fecal occult blood test (FOBT) of stool. You may not have to do this test if you get a colonoscopy every 10 years.  Flexible sigmoidoscopy** or colonoscopy.** / Every 5 years for a flexible sigmoidoscopy or every 10 years for a colonoscopy beginning at age 50 and continuing until age 75.  Hepatitis C blood test.** / For all people born from 1945 through 1965 and any individual with known risks for hepatitis C.  Abdominal aortic aneurysm (AAA) screening./ Screening current or former smokers or have Hypertension.  Skin self-exam. / Monthly.  Influenza vaccine. / Every year.  Tetanus, diphtheria, and acellular pertussis (Tdap/Td) vaccine.** / 1 dose of Td every 10 years.   Zoster vaccine.** / 1 dose for adults aged 60 years or older.         Pneumococcal 13-valent conjugate (PCV13) vaccine.    Pneumococcal polysaccharide (PPSV23) vaccine.     Hepatitis A vaccine.** / Consult your health  care provider.  Hepatitis B vaccine.** / Consult your health care provider. Screening for abdominal aortic aneurysm (AAA)  by ultrasound is recommended for people who have history of high blood pressure or who are current or former smokers. ++++++++++ Recommend Adult Low Dose Aspirin or  coated  Aspirin 81 mg daily  To reduce risk of Colon Cancer 20 %,  Skin Cancer 26 % ,  Melanoma 46%  and  Pancreatic cancer 60% ++++++++++++++++++++++ Vitamin D goal  is between 70-100.  Please make sure that you are taking your Vitamin D as directed.  It is very important as a natural anti-inflammatory  helping hair, skin, and nails, as well as reducing stroke and heart attack risk.  It helps your bones and helps with mood. It also decreases numerous cancer risks so please take it as directed.  Low Vit D is associated with a 200-300% higher risk for CANCER  and 200-300% higher risk for HEART   ATTACK  &  STROKE.   ...................................... It is also associated with higher death rate at younger ages,  autoimmune diseases like Rheumatoid arthritis, Lupus, Multiple Sclerosis.    Also many other serious conditions, like depression, Alzheimer's Dementia, infertility, muscle aches, fatigue, fibromyalgia - just to name a few. ++++++++++++++++++++++ Recommend the book "The END of DIETING" by Dr Joel Fuhrman  & the book "The END of DIABETES " by Dr Joel Fuhrman At [employer] - get book & Audio CD's    Being diabetic has a  300% increased risk for heart attack, stroke, cancer, and alzheimer- type vascular dementia. It is very important that you work harder with diet by avoiding all foods that are white. Avoid white rice (brown & wild rice is OK), white potatoes (sweetpotatoes in moderation is OK), White bread or wheat bread or anything made out of white flour like bagels, donuts, rolls, buns, biscuits, cakes, pastries, cookies, pizza crust, and pasta (made from white flour & egg whites) -  vegetarian pasta or spinach or wheat pasta is OK. Multigrain breads like Arnold's or Pepper[employer], or multigrain sandwich thins or flatbreads.  Diet, exercise and weight loss can reverse and cure diabetes in the early stages.  Diet, exercise and weight loss is very important in the control and prevention of complications of diabetes which affects every system in your body, ie. Brain - dementia/stroke, eyes - glaucoma/blindness, heart - heart attack/heart failure, kidneys - dialysis,   stomach - gastric paralysis, intestines - malabsorption, nerves - severe painful neuritis, circulation - gangrene & loss of a leg(s), and finally cancer and Alzheimers.    I recommend avoid fried & greasy foods,  sweets/candy, white rice (brown or wild rice or Quinoa is OK), white potatoes (sweet potatoes are OK) - anything made from white flour - bagels, doughnuts, rolls, buns, biscuits,white and wheat breads, pizza crust and traditional pasta made of white flour & egg white(vegetarian pasta or spinach or wheat pasta is OK).  Multi-grain bread is OK - like multi-grain flat bread or sandwich thins. Avoid alcohol in excess. Exercise is also important.    Eat all the vegetables you want - avoid meat, especially red meat and dairy - especially cheese.  Cheese is the most concentrated form of trans-fats which is the worst thing to clog up our arteries. Veggie cheese is OK which can be found in the fresh produce section at [employer] or [employer] or [employer]  ++++++++++++++++++++++ DASH Eating Plan  DASH stands for "Dietary Approaches to Stop Hypertension."   The DASH eating plan is a healthy eating plan that has been shown to reduce high blood pressure (hypertension). Additional health benefits may include reducing the risk of type 2 diabetes mellitus, heart disease, and stroke. The DASH eating plan may also help with weight loss. WHAT DO I NEED TO KNOW ABOUT THE DASH EATING PLAN? For the DASH eating plan, you will  follow these general guidelines:  Choose foods with a percent daily value for sodium of less than 5% (as listed on the food label).  Use salt-free seasonings or herbs instead of table salt or sea salt.  Check with your health care provider or pharmacist before using salt substitutes.  Eat lower-sodium products, often labeled as "lower sodium" or "no salt added."  Eat fresh foods.  Eat more vegetables, fruits, and low-fat dairy products.  Choose whole grains. Look for the word "whole" as the first word in the ingredient list.  Choose fish   Limit sweets, desserts, sugars, and sugary drinks.  Choose heart-healthy fats.  Eat veggie cheese   Eat more home-cooked food and less restaurant, buffet, and fast food.  Limit fried foods.  Cook foods using methods other than frying.  Limit canned vegetables. If you do use them, rinse them well to decrease the sodium.  When eating at a restaurant, ask that your food be prepared with less salt, or no salt if possible.                      WHAT FOODS CAN I EAT? Read Dr Joel Fuhrman's books on The End of Dieting & The End of Diabetes  Grains Whole grain or whole wheat bread. Brown rice. Whole grain or whole wheat pasta. Quinoa, bulgur, and whole grain cereals. Low-sodium cereals. Corn or whole wheat flour tortillas. Whole grain cornbread. Whole grain crackers. Low-sodium crackers.  Vegetables Fresh or frozen vegetables (raw, steamed, roasted, or grilled). Low-sodium or reduced-sodium tomato and vegetable juices. Low-sodium or reduced-sodium tomato sauce and paste. Low-sodium or reduced-sodium canned vegetables.   Fruits All fresh, canned (in natural juice), or frozen fruits.  Protein Products  All fish and seafood.  Dried beans, peas, or lentils. Unsalted nuts and seeds. Unsalted canned beans.  Dairy Low-fat dairy products, such as skim or 1% milk, 2% or reduced-fat cheeses, low-fat ricotta or cottage cheese, or plain low-fat yogurt.  Low-sodium or reduced-sodium cheeses.  Fats and Oils Tub margarines   without trans fats. Light or reduced-fat mayonnaise and salad dressings (reduced sodium). Avocado. Safflower, olive, or canola oils. Natural peanut or almond butter.  Other Unsalted popcorn and pretzels. The items listed above may not be a complete list of recommended foods or beverages. Contact your dietitian for more options.  ++++++++++++++++++++  WHAT FOODS ARE NOT RECOMMENDED? Grains/ White flour or wheat flour White bread. White pasta. White rice. Refined cornbread. Bagels and croissants. Crackers that contain trans fat.  Vegetables  Creamed or fried vegetables. Vegetables in a . Regular canned vegetables. Regular canned tomato sauce and paste. Regular tomato and vegetable juices.  Fruits Dried fruits. Canned fruit in light or heavy syrup. Fruit juice.  Meat and Other Protein Products Meat in general - RED meat & White meat.  Fatty cuts of meat. Ribs, chicken wings, all processed meats as bacon, sausage, bologna, salami, fatback, hot dogs, bratwurst and packaged luncheon meats.  Dairy Whole or 2% milk, cream, half-and-half, and cream cheese. Whole-fat or sweetened yogurt. Full-fat cheeses or blue cheese. Non-dairy creamers and whipped toppings. Processed cheese, cheese spreads, or cheese curds.  Condiments Onion and garlic salt, seasoned salt, table salt, and sea salt. Canned and packaged gravies. Worcestershire sauce. Tartar sauce. Barbecue sauce. Teriyaki sauce. Soy sauce, including reduced sodium. Steak sauce. Fish sauce. Oyster sauce. Cocktail sauce. Horseradish. Ketchup and mustard. Meat flavorings and tenderizers. Bouillon cubes. Hot sauce. Tabasco sauce. Marinades. Taco seasonings. Relishes.  Fats and Oils Butter, stick margarine, lard, shortening and bacon fat. Coconut, palm kernel, or palm oils. Regular salad dressings.  Pickles and olives. Salted popcorn and pretzels.  The items listed above may  not be a complete list of foods and beverages to avoid.    

## 2017-08-03 NOTE — Progress Notes (Signed)
[facility]   Unk Pinto, M.D.      [name]. Silverio Lay, P.A.-C [facility]                [address] [place] [address] Telephone [phone] Telefax [phone]  Comprehensive Evaluation & Examination     This very nice 81 y.o. [name] presents for a comprehensive evaluation and management of multiple medical co-morbidities.  Patient has been followed for HTN, T2_NIDDM  Prediabetes, Hyperlipidemia and Vitamin D Deficiency. Patient had a negative Colonoscopy in 2013. In 2000 he underwent suprapubic prostatectomy by Dr Risa Grill and has had no known recurrence.      Patient has been followed for labile HTN predates since 2005. Patient's BP has been controlled at home.  Today's BP is at goal - 140/76. Patient denies any cardiac symptoms as chest pain, palpitations, shortness of breath, dizziness or ankle swelling.     Patient's hyperlipidemia is controlled with diet and medications. Patient denies myalgias or other medication SE's. Last lipids were at goal: Lab Results  Component Value Date   CHOL 150 04/25/2017   HDL 43 04/25/2017   LDLCALC 79 04/25/2017   TRIG 140 04/25/2017   CHOLHDL 3.5 04/25/2017      Patient has prediabetes (A1c 5.7% in 2013 / 5.3% in 2014)  and patient denies reactive hypoglycemic symptoms, visual blurring, diabetic polys or paresthesias. Last A1c was at goal: Lab Results  Component Value Date   HGBA1C 5.1 01/19/2017       Finally, patient has history of Vitamin D Deficiency ("41" on treatment in 2013) and last vitamin D was at goal: Lab Results  Component Value Date   VD25OH 58 01/19/2017   Current Outpatient Prescriptions on File Prior to Visit  Medication Sig  . Ascorbic Acid (VITAMIN C PO) Take by mouth daily.  [name] aspirin 81 MG tablet Take 81 mg by mouth daily.  [name] atorvastatin (LIPITOR) 40 MG tablet TAKE 1 TABLET BY MOUTH DAILY  . Cholecalciferol (VITAMIN D PO)  Take by mouth daily.  [name] CINNAMON PO Take 1,000 mg by mouth daily.  . Coenzyme Q10 200 MG TABS Take 200 mg by mouth daily.  . fish oil-omega-3 fatty acids 1000 MG capsule Take 2 g by mouth daily.  . Flaxseed, Linseed, (FLAXSEED OIL PO) Take 1,000 mg by mouth daily.  . Magnesium 250 MG TABS Take 250 mg by mouth daily.  . Multi-Vit w/Minerals  Take by mouth daily.  [name] PROCTOZONE-HC 2.5 % rect crm USE RECTALLY TWICE DAILY AS DIRECTED  . pseudoephedrine 30 MG tablet Take 30 mg by mouth every 4 (four) hours as needed for congestion.  . vitamin C  500 MG tablet Take 500 mg by mouth daily.   No Known Allergies   Past Medical History:  Diagnosis Date  . Cancer [facility])    prostate - basal cell  . GERD (gastroesophageal reflux disease)   . Hearing loss    wear hearing aid  . Hyperlipidemia   . Hypertension   . Inguinal hernia   . No pertinent past medical history   . Prediabetes   . Vitamin D deficiency   . Wears glasses   . Wears hearing aid    both ears   Health Maintenance  Topic Date Due  . TETANUS/TDAP  07/27/2015  . INFLUENZA VACCINE  07/27/2017  . PNA vac  Low Risk Adult  Completed   Immunization History  Administered Date(s) Administered  . DT 09/03/2015  . Influenza Split 10/28/2016  . Influenza, High Dose Seasonal PF 09/27/2014, 09/03/2015  . Pneumococcal Conjugate-13 10/16/2014  . Pneumococcal-Unspecified 02/03/2011  . Td 07/26/2005  . Zoster 12/27/2006   Past Surgical History:  Procedure Laterality Date  . COLONOSCOPY     x2  . INGUINAL HERNIA REPAIR  01/15/2013   Procedure: HERNIA REPAIR INGUINAL ADULT;  Surgeon: Gwenyth Ober, MD;  Location: [facility];  Service: General;  Laterality: Right;  . [place] or 2000   due to cancer  . TONSILLECTOMY     as a child   Family History  Problem Relation Age of Onset  . Other Mother        bowel infarction  . COPD Father   . Heart disease Father   . Autism Son    Social History    Social History  . Marital status: Married    Spouse name: N/A  . Number of children: 2 sons w/Autism - One is at home and the 2sd resides in a group home.   . Years of education: N/A   Occupational History  . Retired [employer] from [employer] History Main Topics  . Smoking status: Former Smoker    Quit date: 01/10/1999  . Smokeless tobacco: Never Used  . Alcohol use No     Comment: occasional  . Drug use: No  . Sexual activity: No    ROS Constitutional: Denies fever, chills, weight loss/gain, headaches, insomnia,  night sweats or change in appetite. Does c/o fatigue. Eyes: Denies redness, blurred vision, diplopia, discharge, itchy or watery eyes.  ENT: Denies discharge, congestion, post nasal drip, epistaxis, sore throat, earache, hearing loss, dental pain, Tinnitus, Vertigo, Sinus pain or snoring.  Cardio: Denies chest pain, palpitations, irregular heartbeat, syncope, dyspnea, diaphoresis, orthopnea, PND, claudication or edema Respiratory: denies cough, dyspnea, DOE, pleurisy, hoarseness, laryngitis or wheezing.  Gastrointestinal: Denies dysphagia, heartburn, reflux, water brash, pain, cramps, nausea, vomiting, bloating, diarrhea, constipation, hematemesis, melena, hematochezia, jaundice or hemorrhoids Genitourinary: Denies dysuria, frequency, urgency, nocturia, hesitancy, discharge, hematuria or flank pain Musculoskeletal: Denies arthralgia, myalgia, stiffness, Jt. Swelling, pain, limp or strain/sprain. Denies Falls. Skin: Denies puritis, rash, hives, warts, acne, eczema or change in skin lesion Neuro: No weakness, tremor, incoordination, spasms, paresthesia or pain Psychiatric: Denies confusion, memory loss or sensory loss. Denies Depression. Endocrine: Denies change in weight, skin, hair change, nocturia, and paresthesia, diabetic polys, visual blurring or hyper / hypo glycemic episodes.  Heme/Lymph: No excessive bleeding, bruising or enlarged lymph  nodes.  Physical Exam  BP 140/76   Pulse (!) 56   Temp 97.8 F (36.6 C)   Resp 16   Ht 5' 9.75" (1.772 m)   Wt 155 lb 3.2 oz (70.4 kg)   BMI 22.43 kg/m   General Appearance: Well nourished and well groomed and in no apparent distress.  Eyes: PERRLA, EOMs, conjunctiva no swelling or erythema, normal fundi and vessels. Sinuses: No frontal/maxillary tenderness ENT/Mouth: EACs patent / TMs  nl. Nares clear without erythema, swelling, mucoid exudates. Oral hygiene is good. No erythema, swelling, or exudate. Tongue normal, non-obstructing. Tonsils not swollen or erythematous. Hearing normal.  Neck: Supple, thyroid normal. No bruits, nodes or JVD. Respiratory: Respiratory effort normal.  BS equal and clear bilateral without rales, rhonci, wheezing or stridor. Cardio: Heart sounds are normal with regular rate and rhythm  and no murmurs, rubs or gallops. Peripheral pulses are normal and equal bilaterally without edema. No aortic or femoral bruits. Chest: symmetric with normal excursions and percussion.  Abdomen: Soft, with Nl bowel sounds. Nontender, no guarding, rebound, hernias, masses, or organomegaly.  Lymphatics: Non tender without lymphadenopathy.  Genitourinary: No hernias.Testes nl. DRE - prostate nl for age - smooth & firm w/o nodules. Musculoskeletal: Full ROM all peripheral extremities, joint stability, 5/5 strength, and normal gait. Skin: Warm and dry without rashes, lesions, cyanosis, clubbing or  ecchymosis.  Neuro: Cranial nerves intact, reflexes equal bilaterally. Normal muscle tone, no cerebellar symptoms. Sensation intact.  Pysch: Alert and oriented X 3 with normal affect, insight and judgment appropriate.   Assessment and Plan  1. Essential hypertension  - EKG 12-Lead - [place], RETROPERITNL ABD,  LTD - Urinalysis, Routine w reflex microscopic - Microalbumin / creatinine urine ratio - CBC with Differential/Platelet - BASIC METABOLIC PANEL WITH GFR - Magnesium - TSH  2.  Hyperlipidemia, mixed  - EKG 12-Lead - [place], RETROPERITNL ABD,  LTD - Hepatic function panel - Lipid panel - TSH  3. Prediabetes  - EKG 12-Lead - [place], RETROPERITNL ABD,  LTD - Hemoglobin A1c - Insulin, random  4. Vitamin D deficiency  - VITAMIN D 25 Hydroxy   5. Gastroesophageal reflux disease   6. Screening for rectal cancer  - POC Hemoccult Bld/Stl   7. Special screening, prostate cancer  - PSA  8. Personal history of prostate cancer  - PSA  9. Screening for ischemic heart disease  - EKG 12-Lead  10. Screening for AAA (aortic abdominal aneurysm)  - [place], RETROPERITNL ABD,  LTD  11. Medication management - Urinalysis, Routine w reflex microscopic - Microalbumin / creatinine urine ratio - CBC with Differential/Platelet - BASIC METABOLIC PANEL WITH GFR - Hepatic function panel - Magnesium - Lipid panel - TSH - Hemoglobin A1c - Insulin, random - VITAMIN D 25 Hydroxy        Patient was counseled in prudent diet, weight control to achieve/maintain BMI less than 25, BP monitoring, regular exercise and medications as discussed.  Discussed med effects and SE's. Routine screening labs and tests as requested with regular follow-up as recommended. Over 40 minutes of exam, counseling, chart review and high complex critical decision making was performed

## 2017-08-04 LAB — INSULIN, RANDOM: Insulin: 14.9 u[IU]/mL (ref 2.0–19.6)

## 2017-08-04 LAB — LIPID PANEL
Cholesterol: 163 mg/dL (ref <200)
HDL: 46 mg/dL (ref >40)
LDL CALC: 86 mg/dL (ref <100)
Total CHOL/HDL Ratio: 3.5 Ratio (ref <5.0)
Triglycerides: 155 mg/dL — ABNORMAL HIGH (ref <150)
VLDL: 31 mg/dL — ABNORMAL HIGH (ref <30)

## 2017-08-04 LAB — PSA: PSA: 0.1 ng/mL (ref <=4.0)

## 2017-08-04 LAB — HEPATIC FUNCTION PANEL
ALT: 24 U/L (ref 9–46)
AST: 36 U/L — AB (ref 10–35)
Albumin: 4.4 g/dL (ref 3.6–5.1)
Alkaline Phosphatase: 66 U/L (ref 40–115)
BILIRUBIN DIRECT: 0.1 mg/dL (ref <=0.2)
BILIRUBIN TOTAL: 0.6 mg/dL (ref 0.2–1.2)
Indirect Bilirubin: 0.5 mg/dL (ref 0.2–1.2)
Total Protein: 7.3 g/dL (ref 6.1–8.1)

## 2017-08-04 LAB — URINALYSIS, ROUTINE W REFLEX MICROSCOPIC
Bilirubin Urine: NEGATIVE
GLUCOSE, UA: NEGATIVE
Hgb urine dipstick: NEGATIVE
Ketones, ur: NEGATIVE
LEUKOCYTES UA: NEGATIVE
NITRITE: NEGATIVE
PH: 7 (ref 5.0–8.0)
PROTEIN: NEGATIVE
Specific Gravity, Urine: 1.019 (ref 1.001–1.035)

## 2017-08-04 LAB — BASIC METABOLIC PANEL WITH GFR
BUN: 20 mg/dL (ref 7–25)
CALCIUM: 9.9 mg/dL (ref 8.6–10.3)
CO2: 23 mmol/L (ref 20–32)
CREATININE: 1.12 mg/dL (ref 0.70–1.18)
Chloride: 106 mmol/L (ref 98–110)
GFR, Est African American: 72 mL/min (ref >=60)
GFR, Est Non African American: 62 mL/min (ref >=60)
Glucose, Bld: 83 mg/dL (ref 65–99)
Potassium: 5.1 mmol/L (ref 3.5–5.3)
Sodium: 145 mmol/L (ref 135–146)

## 2017-08-04 LAB — MICROALBUMIN / CREATININE URINE RATIO
CREATININE, URINE: 135 mg/dL (ref 20–370)
MICROALB UR: 1.4 mg/dL
Microalb Creat Ratio: 10 mcg/mg creat (ref <30)

## 2017-08-04 LAB — HEMOGLOBIN A1C
Hgb A1c MFr Bld: 5.3 % (ref <5.7)
Mean Plasma Glucose: 105 mg/dL

## 2017-08-04 LAB — MAGNESIUM: MAGNESIUM: 2.4 mg/dL (ref 1.5–2.5)

## 2017-08-04 LAB — VITAMIN D 25 HYDROXY (VIT D DEFICIENCY, FRACTURES): Vit D, 25-Hydroxy: 70 ng/mL (ref 30–100)

## 2017-08-10 ENCOUNTER — Other Ambulatory Visit: Payer: Self-pay

## 2017-08-10 DIAGNOSIS — Z1212 Encounter for screening for malignant neoplasm of rectum: Secondary | ICD-10-CM

## 2017-08-10 LAB — POC HEMOCCULT BLD/STL (HOME/3-CARD/SCREEN)
Card #2 Fecal Occult Blod, POC: NEGATIVE
Card #3 Fecal Occult Blood, POC: NEGATIVE
FECAL OCCULT BLD: NEGATIVE

## 2017-08-27 ENCOUNTER — Other Ambulatory Visit: Payer: Self-pay | Admitting: Internal Medicine

## 2017-08-31 DIAGNOSIS — Z85828 Personal history of other malignant neoplasm of skin: Secondary | ICD-10-CM | POA: Diagnosis not present

## 2017-08-31 DIAGNOSIS — L237 Allergic contact dermatitis due to plants, except food: Secondary | ICD-10-CM | POA: Diagnosis not present

## 2017-08-31 DIAGNOSIS — H61002 Unspecified perichondritis of left external ear: Secondary | ICD-10-CM | POA: Diagnosis not present

## 2017-08-31 DIAGNOSIS — D1801 Hemangioma of skin and subcutaneous tissue: Secondary | ICD-10-CM | POA: Diagnosis not present

## 2017-08-31 DIAGNOSIS — L821 Other seborrheic keratosis: Secondary | ICD-10-CM | POA: Diagnosis not present

## 2017-08-31 DIAGNOSIS — D692 Other nonthrombocytopenic purpura: Secondary | ICD-10-CM | POA: Diagnosis not present

## 2017-08-31 DIAGNOSIS — L570 Actinic keratosis: Secondary | ICD-10-CM | POA: Diagnosis not present

## 2017-10-05 ENCOUNTER — Ambulatory Visit (INDEPENDENT_AMBULATORY_CARE_PROVIDER_SITE_OTHER): Payer: Medicare Other

## 2017-10-05 DIAGNOSIS — Z23 Encounter for immunization: Secondary | ICD-10-CM

## 2017-11-13 NOTE — Progress Notes (Signed)
MEDICARE ANNUAL WELLNESS VISIT AND FOLLOW UP Assessment:   Diagnoses and all orders for this visit:  Encounter for Medicare annual wellness exam  Essential hypertension Somewhat labile; previously well controlled by lifestyle with a single recent elevated reading Monitor blood pressure at home; call if consistently over 130/80 Continue DASH diet.   Reminder to go to the ER if any CP, SOB, nausea, dizziness, severe HA, changes vision/speech, left arm numbness and tingling and jaw pain.  Gastroesophageal reflux disease, esophagitis presence not specified Currently well controlled with lifestyle changes - avoiding triggers, excessively large meals, lying down after eating OTC agents for rare symptoms; contact office if frequency increases  History of prostate cancer S/p prostatectomy 1999; no issues or suspected recurrence. Some mild stress incontinence in past year - wear pad to golf. Predictable symptoms not significantly interfering with life. Continue to monitor.   Mixed hyperlipidemia Continue medications and supplements Continue low cholesterol diet and exercise.  Check lipid panel.   History of elevated glucose Previously prediabetic currently well controlled with lifestyle changes Encouraged to continue with lifestyle Monitor A1C as needed  Vitamin D deficiency Continue supplementation and routine monitoring  History of basal cell carcinoma Continue follow up with derm q6 months or as recommended; wear sunscreen when outdoors and reapply regularly.  Body mass index (BMI) of 22.0-22.9 in adult Rec. Maintain weight with balanced diet and exercise  Over 30 minutes of exam, counseling, chart review, and critical decision making was performed  Future Appointments  Date Time Provider [place]  02/15/2018 11:00 AM Unk Pinto, MD [facility] None  08/31/2018  2:00 PM Unk Pinto, MD [facility] None     Plan:   During the course of the visit the patient  was educated and counseled about appropriate screening and preventive services including:    Pneumococcal vaccine   Influenza vaccine  Prevnar 13  Td vaccine  Screening electrocardiogram  Colorectal cancer screening  Diabetes screening  Glaucoma screening  Nutrition counseling    Subjective:  Brian Proctor is a 81 y.o. male who presents for Medicare Annual Wellness Visit and 3 month follow up for HTN, hyperlipidemia, history prediabetes, and vitamin D Def. His various medical conditions are very well controlled - most managed by lifestyle modification. He continues to walk 60 min 3-4 times a week without issues, golfs and does regular yard work. S/p prostatectomy for CA in 1999 - no issues, but endorses some mild predictable stress incontinence for which he wears a pad when golfing.   His blood pressure has been controlled at home, today their BP is BP: 112/72 He does workout. He denies chest pain, shortness of breath, dizziness.   He is on cholesterol medication, on omega 3 supplement and lifestyle changes. His cholesterol is not at goal. The cholesterol last visit was:  Lab Results  Component Value Date   CHOL 163 08/03/2017   HDL 46 08/03/2017   LDLCALC 86 08/03/2017   TRIG 155 (H) 08/03/2017   CHOLHDL 3.5 08/03/2017   He has been working on diet and exercise for history of prediabetes, and denies increased appetite, nausea, paresthesia of the feet, polydipsia, polyuria and visual disturbances. Last A1C in the office was:  Lab Results  Component Value Date   HGBA1C 5.3 08/03/2017   Last GFR Lab Results  Component Value Date   GFRNONAA 62 08/03/2017    Patient is on Vitamin D supplement.   Lab Results  Component Value Date   VD25OH 70 08/03/2017  Medication Review: Current Outpatient Medications on File Prior to Visit  Medication Sig Dispense Refill  . Ascorbic Acid (VITAMIN C PO) Take by mouth daily.    [name] aspirin 81 MG tablet Take 81 mg by mouth  daily.    [name] atorvastatin (LIPITOR) 40 MG tablet TAKE 1 TABLET BY MOUTH DAILY 90 tablet 1  . Cholecalciferol (VITAMIN D PO) Take by mouth daily.    [name] CINNAMON PO Take 1,000 mg by mouth daily.    . Coenzyme Q10 200 MG TABS Take 200 mg by mouth daily.    . fish oil-omega-3 fatty acids 1000 MG capsule Take 2 g by mouth daily.    . Flaxseed, Linseed, (FLAXSEED OIL PO) Take 1,000 mg by mouth daily.    . Magnesium 250 MG TABS Take 250 mg by mouth daily.    . Multiple Vitamins-Minerals (MULTIVITAMIN PO) Take by mouth daily.    [name] PROCTOZONE-HC 2.5 % rectal cream USE RECTALLY TWICE DAILY AS DIRECTED 30 g 3  . pseudoephedrine (SUDAFED) 30 MG tablet Take 30 mg by mouth every 4 (four) hours as needed for congestion.    . vitamin C (ASCORBIC ACID) 500 MG tablet Take 500 mg by mouth daily.     No current facility-administered medications on file prior to visit.     Allergies: No Known Allergies  Current Problems (verified) has Hyperlipidemia; Hypertension; GERD (gastroesophageal reflux disease); History of elevated glucose; Vitamin D deficiency; Medication management; History of prostate cancer; Encounter for Medicare annual wellness exam; and Body mass index (BMI) of 22.0-22.9 in adult on their problem list.  Screening Tests Immunization History  Administered Date(s) Administered  . DT 09/03/2015  . Influenza Split 10/28/2016  . Influenza, High Dose Seasonal PF 09/27/2014, 09/03/2015, 10/05/2017  . Pneumococcal Conjugate-13 10/16/2014  . Pneumococcal-Unspecified 02/03/2011  . Td 07/26/2005  . Zoster 12/27/2006   Preventative care: Last colonoscopy: 2015   Prior vaccinations: TD or Tdap: 2016  Influenza: 2018 Pneumococcal: 2012 Prevnar13: 2015 Shingles/Zostavax: 2008  Names of Other Physician/Practitioners you currently use: 1. [facility] here for primary care 2. [facility], eye doctor, last visit 2016 3. [facility], dentist, last  visit 2018 - goes q6 months  Sees dermatology q 6 months   Patient Care Team: Unk Pinto, MD as PCP - General (Internal Medicine)  Surgical: He  has a past surgical history that includes Tonsillectomy; Prostate surgery (1999 or 2000); Colonoscopy; and Inguinal hernia repair (01/15/2013). Family His family history includes Autism in his son; COPD in his father; Heart disease in his father; Other in his mother. Social history  He reports that he quit smoking about 18 years ago. he has never used smokeless tobacco. He reports that he does not drink alcohol or use drugs.  MEDICARE WELLNESS OBJECTIVES: Physical activity: Current Exercise Habits: Home exercise routine, Type of exercise: walking;Other - see comments(yardwork and golfing), Time (Minutes): 60, Frequency (Times/Week): 4, Weekly Exercise (Minutes/Week): 240, Intensity: Moderate, Exercise limited by: None identified Cardiac risk factors: Cardiac Risk Factors include: advanced age (>110men, >48 women);male gender;smoking/ tobacco exposure Depression/mood screen:   Depression screen [facility] 2/9 11/14/2017  Decreased Interest 0  Down, Depressed, Hopeless 0  PHQ - 2 Score 0    ADLs:  In your present state of health, do you have any difficulty performing the following activities: 11/14/2017 08/03/2017  Hearing? Y N  Comment Wears bilateral hearing aids; recalibrated 4 months ago -  Vision? N N  Difficulty concentrating or making  decisions? N N  Walking or climbing stairs? N N  Dressing or bathing? N N  Doing errands, shopping? N N  Some recent data might be hidden     Cognitive Testing  Alert? Yes  Normal Appearance?Yes  Oriented to person? Yes  Place? Yes   Time? Yes  Recall of three objects?  Yes  Can perform simple calculations? Yes  Displays appropriate judgment?Yes  Can read the correct time from a watch face?Yes  EOL planning: Does Patient Have a Medical Advance Directive?: Yes Type of Advance Directive: Healthcare  Power of Attorney, Living will Does patient want to make changes to medical advance directive?: No - Patient declined Copy of [place] in Chart?: No - copy requested   Objective:   Today's Vitals   11/14/17 1053  BP: 112/72  Pulse: 62  Temp: (!) 97.4 F (36.3 C)  SpO2: 96%  Weight: 158 lb 12.8 oz (72 kg)  Height: 5' 9.75" (1.772 m)   Body mass index is 22.95 kg/m.  General appearance: alert, no distress, WD/WN, male HEENT: normocephalic, sclerae anicteric, TMs pearly, nares patent, no discharge or erythema, pharynx normal Oral cavity: MMM, no lesions Neck: supple, no lymphadenopathy, no thyromegaly, no masses Heart: RRR, normal S1, S2, no murmurs Lungs: CTA bilaterally, no wheezes, rhonchi, or rales Abdomen: +bs, soft, non tender, non distended, no masses, no hepatomegaly, no splenomegaly Musculoskeletal: nontender, no swelling, no obvious deformity Extremities: no edema, no cyanosis, no clubbing Pulses: 2+ symmetric, upper and lower extremities, normal cap refill Neurological: alert, oriented x 3, CN2-12 intact, strength normal upper extremities and lower extremities, sensation normal throughout, DTRs 2+ throughout, no cerebellar signs, gait normal Psychiatric: normal affect, behavior normal, pleasant  Skin: Abrasion/scab to R shin - border mildly injected without notable heat, no discharge.   Medicare Attestation I have personally reviewed: The patient's medical and social history Their use of alcohol, tobacco or illicit drugs Their current medications and supplements The patient's functional ability including ADLs,fall risks, home safety risks, cognitive, and hearing and visual impairment Diet and physical activities Evidence for depression or mood disorders  The patient's weight, height, BMI, and visual acuity have been recorded in the chart.  I have made referrals, counseling, and provided education to the patient based on review of the above and I  have provided the patient with a written personalized care plan for preventive services.     Izora Ribas, NP   11/14/2017

## 2017-11-14 ENCOUNTER — Ambulatory Visit (INDEPENDENT_AMBULATORY_CARE_PROVIDER_SITE_OTHER): Payer: Medicare Other | Admitting: Adult Health

## 2017-11-14 ENCOUNTER — Encounter: Payer: Self-pay | Admitting: Adult Health

## 2017-11-14 VITALS — BP 112/72 | HR 62 | Temp 97.4°F | Ht 69.75 in | Wt 158.8 lb

## 2017-11-14 DIAGNOSIS — E559 Vitamin D deficiency, unspecified: Secondary | ICD-10-CM | POA: Diagnosis not present

## 2017-11-14 DIAGNOSIS — Z79899 Other long term (current) drug therapy: Secondary | ICD-10-CM

## 2017-11-14 DIAGNOSIS — E782 Mixed hyperlipidemia: Secondary | ICD-10-CM | POA: Diagnosis not present

## 2017-11-14 DIAGNOSIS — Z8639 Personal history of other endocrine, nutritional and metabolic disease: Secondary | ICD-10-CM | POA: Diagnosis not present

## 2017-11-14 DIAGNOSIS — Z0001 Encounter for general adult medical examination with abnormal findings: Secondary | ICD-10-CM | POA: Diagnosis not present

## 2017-11-14 DIAGNOSIS — K219 Gastro-esophageal reflux disease without esophagitis: Secondary | ICD-10-CM | POA: Diagnosis not present

## 2017-11-14 DIAGNOSIS — Z8546 Personal history of malignant neoplasm of prostate: Secondary | ICD-10-CM

## 2017-11-14 DIAGNOSIS — Z0000 Encounter for general adult medical examination without abnormal findings: Secondary | ICD-10-CM

## 2017-11-14 DIAGNOSIS — R6889 Other general symptoms and signs: Secondary | ICD-10-CM

## 2017-11-14 DIAGNOSIS — Z6822 Body mass index (BMI) 22.0-22.9, adult: Secondary | ICD-10-CM

## 2017-11-14 DIAGNOSIS — I10 Essential (primary) hypertension: Secondary | ICD-10-CM | POA: Diagnosis not present

## 2017-11-14 DIAGNOSIS — Z85828 Personal history of other malignant neoplasm of skin: Secondary | ICD-10-CM

## 2017-11-14 NOTE — Patient Instructions (Signed)
We do not have a copy of your advanced directive/ power of attorney in the chart. Please drop off a copy with the front desk when convenient or at the next visit.     Here is some information to help you keep your heart healthy: Move it! - Aim for 30 mins of activity every day. Take it slowly at first. Talk to [place] before starting any new exercise program.   Lose it.  -Body Mass Index (BMI) can indicate if you need to lose weight. A healthy range is 18.5-24.9. For a BMI calculator, go to [employer].com  Waist Management -Excess abdominal fat is a risk factor for heart disease, diabetes, asthma, stroke and more. Ideal waist circumference is less than 35" for women and less than 40" for men.   Eat Right -focus on fruits, vegetables, whole grains, and meals you make yourself. Avoid foods with trans fat and high sugar/sodium content.   Snooze or Snore? - Loud snoring can be a sign of sleep apnea, a significant risk factor for high blood pressure, heart attach, stroke, and heart arrhythmias.  Kick the habit -Quit Smoking! Avoid second hand smoke. A single cigarette raises your blood pressure for 20 mins and increases the risk of heart attack and stroke for the next 24 hours.   Are Aspirin and Supplements right for you? -Add ENTERIC COATED low dose 81 mg Aspirin daily OR can do every other day if you have easy bruising to protect your heart and head. As well as to reduce risk of Colon Cancer by 20 %, Skin Cancer by 26 % , Melanoma by 46% and Pancreatic cancer by 60%  Say "No to Stress -There may be little you can do about problems that cause stress. However, techniques such as long walks, meditation, and exercise can help you manage it.   Start Now! - Make changes one at a time and set reasonable goals to increase your likelihood of success.

## 2017-11-15 ENCOUNTER — Other Ambulatory Visit: Payer: Self-pay | Admitting: Adult Health

## 2017-11-15 DIAGNOSIS — E875 Hyperkalemia: Secondary | ICD-10-CM

## 2017-11-15 LAB — HEMOGLOBIN A1C
Hgb A1c MFr Bld: 5.4 % of total Hgb (ref <5.7)
Mean Plasma Glucose: 108 (calc)
eAG (mmol/L): 6 (calc)

## 2017-11-15 LAB — BASIC METABOLIC PANEL WITH GFR
BUN: 16 mg/dL (ref 7–25)
CALCIUM: 10.1 mg/dL (ref 8.6–10.3)
CHLORIDE: 106 mmol/L (ref 98–110)
CO2: 31 mmol/L (ref 20–32)
Creat: 1.15 mg/dL (ref 0.70–1.18)
GFR, EST AFRICAN AMERICAN: 70 mL/min/{1.73_m2} (ref >=60)
GFR, Est Non African American: 60 mL/min/{1.73_m2} (ref >=60)
Glucose, Bld: 92 mg/dL (ref 65–99)
Potassium: 5.7 mmol/L — ABNORMAL HIGH (ref 3.5–5.3)
Sodium: 148 mmol/L — ABNORMAL HIGH (ref 135–146)

## 2017-11-15 LAB — HEPATIC FUNCTION PANEL
AG RATIO: 1.4 (calc) (ref 1.0–2.5)
ALBUMIN MSPROF: 4.2 g/dL (ref 3.6–5.1)
ALKALINE PHOSPHATASE (APISO): 69 U/L (ref 40–115)
ALT: 22 U/L (ref 9–46)
AST: 25 U/L (ref 10–35)
BILIRUBIN DIRECT: 0.1 mg/dL (ref 0.0–0.2)
BILIRUBIN INDIRECT: 0.3 mg/dL (ref 0.2–1.2)
BILIRUBIN TOTAL: 0.4 mg/dL (ref 0.2–1.2)
GLOBULIN: 2.9 g/dL (ref 1.9–3.7)
TOTAL PROTEIN: 7.1 g/dL (ref 6.1–8.1)

## 2017-11-15 LAB — CBC WITH DIFFERENTIAL/PLATELET
BASOS PCT: 1 %
Basophils Absolute: 99 cells/uL (ref 0–200)
Eosinophils Absolute: 535 cells/uL — ABNORMAL HIGH (ref 15–500)
Eosinophils Relative: 5.4 %
HEMATOCRIT: 44.7 % (ref 38.5–50.0)
Hemoglobin: 15.3 g/dL (ref 13.2–17.1)
LYMPHS ABS: 2277 {cells}/uL (ref 850–3900)
MCH: 30.7 pg (ref 27.0–33.0)
MCHC: 34.2 g/dL (ref 32.0–36.0)
MCV: 89.8 fL (ref 80.0–100.0)
MPV: 11.3 fL (ref 7.5–12.5)
Monocytes Relative: 9.1 %
Neutro Abs: 6089 cells/uL (ref 1500–7800)
Neutrophils Relative %: 61.5 %
PLATELETS: 241 10*3/uL (ref 140–400)
RBC: 4.98 10*6/uL (ref 4.20–5.80)
RDW: 12.8 % (ref 11.0–15.0)
TOTAL LYMPHOCYTE: 23 %
WBC: 9.9 10*3/uL (ref 3.8–10.8)
WBCMIX: 901 {cells}/uL (ref 200–950)

## 2017-11-15 LAB — LIPID PANEL
CHOL/HDL RATIO: 4 (calc) (ref <5.0)
Cholesterol: 166 mg/dL (ref <200)
HDL: 41 mg/dL (ref >40)
LDL CHOLESTEROL (CALC): 96 mg/dL
Non-HDL Cholesterol (Calc): 125 mg/dL (calc) (ref <130)
TRIGLYCERIDES: 192 mg/dL — AB (ref <150)

## 2017-11-15 LAB — MAGNESIUM: Magnesium: 2.3 mg/dL (ref 1.5–2.5)

## 2017-11-28 ENCOUNTER — Encounter: Payer: Self-pay | Admitting: Adult Health

## 2017-11-28 ENCOUNTER — Ambulatory Visit: Payer: Medicare Other

## 2017-11-28 ENCOUNTER — Ambulatory Visit (INDEPENDENT_AMBULATORY_CARE_PROVIDER_SITE_OTHER): Payer: Medicare Other | Admitting: Adult Health

## 2017-11-28 VITALS — BP 104/68 | HR 78 | Temp 97.7°F | Ht 69.75 in | Wt 160.0 lb

## 2017-11-28 DIAGNOSIS — E875 Hyperkalemia: Secondary | ICD-10-CM | POA: Diagnosis not present

## 2017-11-28 DIAGNOSIS — R55 Syncope and collapse: Secondary | ICD-10-CM

## 2017-11-28 DIAGNOSIS — K5900 Constipation, unspecified: Secondary | ICD-10-CM

## 2017-11-28 MED ORDER — LACTULOSE 10 GM/15ML PO SOLN: 20.0000 g | Freq: Two times a day (BID) | ORAL | 0 refills | Status: DC

## 2017-11-28 NOTE — Patient Instructions (Signed)
Pick up some magnesium citrate - clear bottle - drink half - followed by 2 glasses of water, may repeat in 4-8 hours  Increase hydration- 80-100 fluid ounces  Increase fiber - may start citrucel/benefiber daily  May consider a miralax daily if continue to have issues     Can try the lactulose for constipation Start out with 10mg  or 15 ml once a day, can go up to 30 ml twice a day as needed for constipation Please also make sure you are drinking a lot of water, up to 64-80 oz a day Try to increase your fiber such as whole graines and veggies You can also add a fiber supplement like Citracel or Benefiber, these do not cause gas and bloating and are safe to use.   Walking or moving 20 mins a day can helps as well  If you have severe AB pain, no BM for 3-4 days, or blood in your stool let us know.    Constipation, Adult Constipation is when a person has fewer bowel movements in a week than normal, has difficulty having a bowel movement, or has stools that are dry, hard, or larger than normal. Constipation may be caused by an underlying condition. It may become worse with age if a person takes certain medicines and does not take in enough fluids. Follow these instructions at home: Eating and drinking   Eat foods that have a lot of fiber, such as fresh fruits and vegetables, whole grains, and beans.  Limit foods that are high in fat, low in fiber, or overly processed, such as french fries, hamburgers, cookies, candies, and soda.  Drink enough fluid to keep your urine clear or pale yellow. General instructions  Exercise regularly or as told by your health care provider.  Go to the restroom when you have the urge to go. Do not hold it in.  Take over-the-counter and prescription medicines only as told by your health care provider. These include any fiber supplements.  Practice pelvic floor retraining exercises, such as deep breathing while relaxing the lower abdomen and pelvic floor  relaxation during bowel movements.  Watch your condition for any changes.  Keep all follow-up visits as told by your health care provider. This is important. Contact a health care provider if:  You have pain that gets worse.  You have a fever.  You do not have a bowel movement after 4 days.  You vomit.  You are not hungry.  You lose weight.  You are bleeding from the anus.  You have thin, pencil-like stools. Get help right away if:  You have a fever and your symptoms suddenly get worse.  You leak stool or have blood in your stool.  Your abdomen is bloated.  You have severe pain in your abdomen.  You feel dizzy or you faint. This information is not intended to replace advice given to you by your health care provider. Make sure you discuss any questions you have with your health care provider. Document Released: 09/10/2004 Document Revised: 07/02/2016 Document Reviewed: 06/02/2016 Elsevier Interactive Patient Education  2017 [employer].

## 2017-11-28 NOTE — Progress Notes (Signed)
Assessment and Plan:  Brian Proctor was seen today for diarrhea.  Diagnoses and all orders for this visit:  Constipation, unspecified constipation type - Increase fiber/ water intake, decrease caffeine, increase activity level, can add citrucel/benefiber, or miralax until BM soft.  Please go to the hospital if you have severe abdominal pain, vomiting, fever, CP, SOB.  -     lactulose (CHRONULAC) 10 GM/15ML solution; Take 30 mLs (20 g total) by mouth 2 (two) times daily.  Vasovagal response        -      Single episode of diaphoresis without syncope after straining on commode at length - no other concerning symptoms- CP/palpitations, N/V - HA, unilateral weakness         -     Patient reassured, advised avoiding straining on the commode for extended periods of time  Further disposition pending results of labs. Discussed med's effects and SE's.   Over 15 minutes of exam, counseling, chart review, and critical decision making was performed.   Future Appointments  Date Time Provider [place]  02/15/2018 11:00 AM Unk Pinto, MD [facility] None  08/31/2018  2:00 PM Unk Pinto, MD [facility] None    ------------------------------------------------------------------------------------------------------------------   HPI BP 104/68   Pulse 78   Temp 97.7 F (36.5 C)   Ht 5' 9.75" (1.772 m)   Wt 160 lb (72.6 kg)   SpO2 99%   BMI 23.12 kg/m   81 y.o.male presents concerned about an odd episode of fecal urgency, he strained on the commode for 15-20 min without BM followed by an episode of diaphresis and feeling weak which resolved once he got off the commode and rested for 5 mins. He reports he is having similar episodes of fecal urgency without a BM multiple times today, denies further episodes of diaphoresis/weakness. He reports his last normal BM was yesterday, denies abdominal pain, N/V, sensation of bloating - he denies changes in recent BM prior to today - normal shape, denies  hematochezia, melena, very hard stools alternating with liquid. He does endorse some scant bright blood steaking on toilet paper after wiping this afternoon - patient has hx of hemorrhoids for which he is prescribed topical agents.    He has taken a gas X today - he cannot tell if this has helped yet.   Last colonoscopy 2015 - normal   Past Medical History:  Diagnosis Date  . Cancer [facility])    prostate - basal cell  . GERD (gastroesophageal reflux disease)   . Hearing loss    wear hearing aid  . Hyperlipidemia   . Hypertension   . Inguinal hernia   . No pertinent past medical history   . Prediabetes   . Vitamin D deficiency   . Wears glasses   . Wears hearing aid    both ears     No Known Allergies  Current Outpatient Medications on File Prior to Visit  Medication Sig  . Ascorbic Acid (VITAMIN C PO) Take by mouth daily.  [name] aspirin 81 MG tablet Take 81 mg by mouth daily.  [name] atorvastatin (LIPITOR) 40 MG tablet TAKE 1 TABLET BY MOUTH DAILY  . Cholecalciferol (VITAMIN D PO) Take by mouth daily.  [name] CINNAMON PO Take 1,000 mg by mouth daily.  . Coenzyme Q10 200 MG TABS Take 200 mg by mouth daily.  . fish oil-omega-3 fatty acids 1000 MG capsule Take 2 g by mouth daily.  . Flaxseed, Linseed, (FLAXSEED OIL PO) Take 1,000 mg by mouth  daily.  . Magnesium 250 MG TABS Take 250 mg by mouth daily.  . Multiple Vitamins-Minerals (MULTIVITAMIN PO) Take by mouth daily.  [name] PROCTOZONE-HC 2.5 % rectal cream USE RECTALLY TWICE DAILY AS DIRECTED  . pseudoephedrine (SUDAFED) 30 MG tablet Take 30 mg by mouth every 4 (four) hours as needed for congestion.  . vitamin C (ASCORBIC ACID) 500 MG tablet Take 500 mg by mouth daily.   No current facility-administered medications on file prior to visit.     HKV:QQVZDG of Systems  Constitutional: Negative for chills, fever, malaise/fatigue and weight loss.  HENT: Negative.   Eyes: Negative.   Respiratory: Negative for cough, shortness of breath and  wheezing.   Cardiovascular: Negative for chest pain, palpitations, orthopnea, claudication and leg swelling.  Gastrointestinal: Negative for abdominal pain, blood in stool, constipation, diarrhea, heartburn, melena, nausea and vomiting.  Genitourinary: Negative.   Musculoskeletal: Negative for joint pain and myalgias.  Skin: Negative for rash.  Neurological: Negative for dizziness, tingling, sensory change, weakness and headaches.  Endo/Heme/Allergies: Negative for polydipsia.  Psychiatric/Behavioral: Negative.   All other systems reviewed and are negative.   Physical Exam:  BP 104/68   Pulse 78   Temp 97.7 F (36.5 C)   Ht 5' 9.75" (1.772 m)   Wt 160 lb (72.6 kg)   SpO2 99%   BMI 23.12 kg/m   General Appearance: Well nourished, in no apparent distress. Neck: Supple.  Respiratory: Respiratory effort normal, BS equal bilaterally without rales, rhonchi, wheezing or stridor.  Cardio: RRR with no MRGs. Brisk peripheral pulses without edema.  Abdomen: Soft, + BS.  Non tender, no guarding, rebound, hernias, masses. Lymphatics: Non tender without lymphadenopathy.  Musculoskeletal: Full ROM, 5/5 strength, normal gait.  Skin: Warm, dry without rashes, lesions, ecchymosis.  Neuro: Cranial nerves intact. Normal muscle tone, no cerebellar symptoms. Sensation intact.  Psych: Awake and oriented X 3, normal affect, Insight and Judgment appropriate.    Izora Ribas, NP 3:11 PM [facility]

## 2017-11-28 NOTE — Progress Notes (Signed)
Patient presents to the office for lab work. No changes with medication. Patient did state that he is not feeling well at all, woke up having diarrhea and cold sweats. Patient is coming back this afternoon for an appointment with provider.

## 2017-11-29 LAB — BASIC METABOLIC PANEL WITH GFR
BUN: 15 mg/dL (ref 7–25)
CALCIUM: 9.9 mg/dL (ref 8.6–10.3)
CO2: 28 mmol/L (ref 20–32)
CREATININE: 1.11 mg/dL (ref 0.70–1.18)
Chloride: 107 mmol/L (ref 98–110)
GFR, Est African American: 73 mL/min/{1.73_m2} (ref >=60)
GFR, Est Non African American: 63 mL/min/{1.73_m2} (ref >=60)
GLUCOSE: 105 mg/dL — AB (ref 65–99)
POTASSIUM: 4.7 mmol/L (ref 3.5–5.3)
Sodium: 146 mmol/L (ref 135–146)

## 2018-01-30 DIAGNOSIS — H2513 Age-related nuclear cataract, bilateral: Secondary | ICD-10-CM | POA: Diagnosis not present

## 2018-02-15 ENCOUNTER — Ambulatory Visit: Payer: Self-pay | Admitting: Internal Medicine

## 2018-03-01 DIAGNOSIS — C44519 Basal cell carcinoma of skin of other part of trunk: Secondary | ICD-10-CM | POA: Diagnosis not present

## 2018-03-01 DIAGNOSIS — C44619 Basal cell carcinoma of skin of left upper limb, including shoulder: Secondary | ICD-10-CM | POA: Diagnosis not present

## 2018-03-01 DIAGNOSIS — L570 Actinic keratosis: Secondary | ICD-10-CM | POA: Diagnosis not present

## 2018-03-01 DIAGNOSIS — Z85828 Personal history of other malignant neoplasm of skin: Secondary | ICD-10-CM | POA: Diagnosis not present

## 2018-03-01 DIAGNOSIS — L821 Other seborrheic keratosis: Secondary | ICD-10-CM | POA: Diagnosis not present

## 2018-03-01 DIAGNOSIS — D485 Neoplasm of uncertain behavior of skin: Secondary | ICD-10-CM | POA: Diagnosis not present

## 2018-03-01 DIAGNOSIS — D1801 Hemangioma of skin and subcutaneous tissue: Secondary | ICD-10-CM | POA: Diagnosis not present

## 2018-03-01 DIAGNOSIS — C44319 Basal cell carcinoma of skin of other parts of face: Secondary | ICD-10-CM | POA: Diagnosis not present

## 2018-03-02 ENCOUNTER — Ambulatory Visit (INDEPENDENT_AMBULATORY_CARE_PROVIDER_SITE_OTHER): Payer: Medicare Other | Admitting: Internal Medicine

## 2018-03-02 ENCOUNTER — Encounter: Payer: Self-pay | Admitting: Internal Medicine

## 2018-03-02 VITALS — BP 108/62 | HR 72 | Temp 97.5°F | Resp 18 | Ht 69.75 in | Wt 165.0 lb

## 2018-03-02 DIAGNOSIS — I10 Essential (primary) hypertension: Secondary | ICD-10-CM | POA: Diagnosis not present

## 2018-03-02 DIAGNOSIS — Z8249 Family history of ischemic heart disease and other diseases of the circulatory system: Secondary | ICD-10-CM | POA: Insufficient documentation

## 2018-03-02 DIAGNOSIS — E782 Mixed hyperlipidemia: Secondary | ICD-10-CM

## 2018-03-02 DIAGNOSIS — Z79899 Other long term (current) drug therapy: Secondary | ICD-10-CM

## 2018-03-02 DIAGNOSIS — R7303 Prediabetes: Secondary | ICD-10-CM | POA: Diagnosis not present

## 2018-03-02 DIAGNOSIS — E559 Vitamin D deficiency, unspecified: Secondary | ICD-10-CM

## 2018-03-02 DIAGNOSIS — Z87891 Personal history of nicotine dependence: Secondary | ICD-10-CM | POA: Insufficient documentation

## 2018-03-02 DIAGNOSIS — R7309 Other abnormal glucose: Secondary | ICD-10-CM

## 2018-03-02 NOTE — Progress Notes (Signed)
This very nice 82 y.o. [name] presents for 6 month follow up with HTN, HLD, Pre-Diabetes and Vitamin D Deficiency.      Patient is followed expectantly for labile HTN since 2005 & BP has been controlled at home. Today's BP is at goal - 108/62. Patient has had no complaints of any cardiac type chest pain, palpitations, dyspnea / orthopnea / PND, dizziness, claudication, or dependent edema.     Hyperlipidemia is controlled with diet & meds. Patient denies myalgias or other med SE's. Last Lipids were at goal albeit sl elevated trig's: Lab Results  Component Value Date   CHOL 166 11/14/2017   HDL 41 11/14/2017   LDLCALC 86 08/03/2017   TRIG 192 (H) 11/14/2017   CHOLHDL 4.0 11/14/2017      Also, the patient has history of PreDiabetes (A1c 5.7%/2013 and then 5.3%/2014)  and has had no symptoms of reactive hypoglycemia, diabetic polys, paresthesias or visual blurring.  Last A1c was Normal & at goal: Lab Results  Component Value Date   HGBA1C 5.4 11/14/2017      Further, the patient also has history of Vitamin D Deficiency ("41" on Tx in 2013) and supplements vitamin D without any suspected side-effects. Last vitamin D was at goal:  Lab Results  Component Value Date   VD25OH 22 08/03/2017   Current Outpatient Medications on File Prior to Visit  Medication Sig  . Ascorbic Acid (VITAMIN C PO) Take by mouth daily.  [name] aspirin 81 MG tablet Take 81 mg by mouth daily.  [name] atorvastatin (LIPITOR) 40 MG tablet TAKE 1 TABLET BY MOUTH DAILY  . Cholecalciferol (VITAMIN D PO) Take by mouth daily.  [name] CINNAMON PO Take 1,000 mg by mouth daily.  . Coenzyme Q10 200 MG TABS Take 200 mg by mouth daily.  . fish oil-omega-3 fatty acids 1000 MG capsule Take 2 g by mouth daily.  . Flaxseed, Linseed, (FLAXSEED OIL PO) Take 1,000 mg by mouth daily.  . Magnesium 250 MG TABS Take 250 mg by mouth daily.  . Multiple Vitamins-Minerals (MULTIVITAMIN PO) Take by mouth daily.  [name] PROCTOZONE-HC 2.5 % rectal cream USE  RECTALLY TWICE DAILY AS DIRECTED  . pseudoephedrine (SUDAFED) 30 MG tablet Take 30 mg by mouth every 4 (four) hours as needed for congestion.  . vitamin C (ASCORBIC ACID) 500 MG tablet Take 500 mg by mouth daily.   No current facility-administered medications on file prior to visit.    No Known Allergies   PMHx:   Past Medical History:  Diagnosis Date  . Cancer [facility])    prostate - basal cell  . GERD (gastroesophageal reflux disease)   . Hearing loss    wear hearing aid  . Hyperlipidemia   . Hypertension   . Inguinal hernia   . No pertinent past medical history   . Prediabetes   . Vitamin D deficiency   . Wears glasses   . Wears hearing aid    both ears   Immunization History  Administered Date(s) Administered  . DT 09/03/2015  . Influenza Split 10/28/2016  . Influenza, High Dose Seasonal PF 09/27/2014, 09/03/2015, 10/05/2017  . Pneumococcal Conjugate-13 10/16/2014  . Pneumococcal-Unspecified 02/03/2011  . Td 07/26/2005  . Zoster 12/27/2006   Past Surgical History:  Procedure Laterality Date  . COLONOSCOPY Negative in 2013        . INGUINAL HERNIA REPAIR  01/15/2013   Procedure: HERNIA REPAIR INGUINAL ADULT;  Surgeon: Gwenyth Ober, MD;  Location:  [place];  Service: General;  Laterality: Right;  . Suprapubic Prostatectomy - Dr Risa Grill  2000   due to cancer  . TONSILLECTOMY     as a child   FHx:    Reviewed / unchanged  SHx:    Reviewed / unchanged   Systems Review:  Constitutional: Denies fever, chills, wt changes, headaches, insomnia, fatigue, night sweats, change in appetite. Eyes: Denies redness, blurred vision, diplopia, discharge, itchy, watery eyes.  ENT: Denies discharge, congestion, post nasal drip, epistaxis, sore throat, earache, hearing loss, dental pain, tinnitus, vertigo, sinus pain, snoring.  CV: Denies chest pain, palpitations, irregular heartbeat, syncope, dyspnea, diaphoresis, orthopnea, PND, claudication or edema. Respiratory:  denies cough, dyspnea, DOE, pleurisy, hoarseness, laryngitis, wheezing.  Gastrointestinal: Denies dysphagia, odynophagia, heartburn, reflux, water brash, abdominal pain or cramps, nausea, vomiting, bloating, diarrhea, constipation, hematemesis, melena, hematochezia  or hemorrhoids. Genitourinary: Denies dysuria, frequency, urgency, nocturia, hesitancy, discharge, hematuria or flank pain. Musculoskeletal: Denies arthralgias, myalgias, stiffness, jt. swelling, pain, limping or strain/sprain.  Skin: Denies pruritus, rash, hives, warts, acne, eczema or change in skin lesion(s). Neuro: No weakness, tremor, incoordination, spasms, paresthesia or pain. Psychiatric: Denies confusion, memory loss or sensory loss. Endo: Denies change in weight, skin or hair change.  Heme/Lymph: No excessive bleeding, bruising or enlarged lymph nodes.  Physical Exam  BP 108/62   Pulse 72   Temp (!) 97.5 F (36.4 C)   Resp 18   Ht 5' 9.75" (1.772 m)   Wt 165 lb (74.8 kg)   BMI 23.85 kg/m   Appears  well nourished, well groomed  and in no distress.  Eyes: PERRLA, EOMs, conjunctiva no swelling or erythema. Sinuses: No frontal/maxillary tenderness ENT/Mouth: EAC's clear, TM's nl w/o erythema, bulging. Nares clear w/o erythema, swelling, exudates. Oropharynx clear without erythema or exudates. Oral hygiene is good. Tongue normal, non obstructing. Hearing intact.  Neck: Supple. Thyroid not palpable. Car 2+/2+ without bruits, nodes or JVD. Chest: Respirations nl with BS clear & equal w/o rales, rhonchi, wheezing or stridor.  Cor: Heart sounds normal w/ regular rate and rhythm without sig. murmurs, gallops, clicks or rubs. Peripheral pulses normal and equal  without edema.  Abdomen: Soft & bowel sounds normal. Non-tender w/o guarding, rebound, hernias, masses or organomegaly.  Lymphatics: Unremarkable.  Musculoskeletal: Full ROM all peripheral extremities, joint stability, 5/5 strength and normal gait.  Skin: Warm, dry  without exposed rashes, lesions or ecchymosis apparent.  Neuro: Cranial nerves intact, reflexes equal bilaterally. Sensory-motor testing grossly intact. Tendon reflexes grossly intact.  Pysch: Alert & oriented x 3.  Insight and judgement nl & appropriate. No ideations.  Assessment and Plan:  1. Essential hypertension  - Continue medication, monitor blood pressure at home.  - Continue DASH diet. Reminder to go to the ER if any CP,  SOB, nausea, dizziness, severe HA, changes vision/speech.  - CBC with Differential/Platelet - BASIC METABOLIC PANEL WITH GFR - Magnesium - TSH  2. Hyperlipidemia, mixed  - Continue diet/meds, exercise,& lifestyle modifications.  - Continue monitor periodic cholesterol/liver & renal functions   - Hepatic function panel - Lipid panel - TSH  3. Abnormal glucose  - Continue diet, exercise, & lifestyle modifications.   - Hemoglobin A1c - Insulin, random  4. Vitamin D deficiency  - Monitor appropriate labs. - Continue supplementation.  - VITAMIN D 25 Hydroxyl  5. Prediabetes  - Continue diet, exercise, & lifestyle modifications.   - Hemoglobin A1c - Insulin, random  6. Medication management  - CBC with  Differential/Platelet - BASIC METABOLIC PANEL WITH GFR - Hepatic function panel - Magnesium - Lipid panel - TSH - Hemoglobin A1c - Insulin, random - VITAMIN D 25 Hydroxyl       Discussed  regular exercise, BP monitoring, weight control to achieve/maintain BMI less than 25 and discussed med and SE's. Recommended labs to assess and monitor clinical status with further disposition pending results of labs. Over 30 minutes of exam, counseling, chart review was performed.

## 2018-03-02 NOTE — Patient Instructions (Signed)

## 2018-03-03 LAB — HEPATIC FUNCTION PANEL
AG Ratio: 1.5 (calc) (ref 1.0–2.5)
ALKALINE PHOSPHATASE (APISO): 73 U/L (ref 40–115)
ALT: 23 U/L (ref 9–46)
AST: 27 U/L (ref 10–35)
Albumin: 4 g/dL (ref 3.6–5.1)
Bilirubin, Direct: 0.1 mg/dL (ref 0.0–0.2)
GLOBULIN: 2.7 g/dL (ref 1.9–3.7)
Indirect Bilirubin: 0.5 mg/dL (calc) (ref 0.2–1.2)
TOTAL PROTEIN: 6.7 g/dL (ref 6.1–8.1)
Total Bilirubin: 0.6 mg/dL (ref 0.2–1.2)

## 2018-03-03 LAB — BASIC METABOLIC PANEL WITH GFR
BUN / CREAT RATIO: 13 (calc) (ref 6–22)
BUN: 15 mg/dL (ref 7–25)
CALCIUM: 9 mg/dL (ref 8.6–10.3)
CO2: 30 mmol/L (ref 20–32)
Chloride: 105 mmol/L (ref 98–110)
Creat: 1.14 mg/dL — ABNORMAL HIGH (ref 0.70–1.11)
GFR, Est African American: 70 mL/min/{1.73_m2} (ref >=60)
GFR, Est Non African American: 60 mL/min/{1.73_m2} (ref >=60)
GLUCOSE: 84 mg/dL (ref 65–99)
POTASSIUM: 4.3 mmol/L (ref 3.5–5.3)
Sodium: 141 mmol/L (ref 135–146)

## 2018-03-03 LAB — TSH: TSH: 3.13 m[IU]/L (ref 0.40–4.50)

## 2018-03-03 LAB — INSULIN, RANDOM: Insulin: 57.1 u[IU]/mL — ABNORMAL HIGH (ref 2.0–19.6)

## 2018-03-03 LAB — LIPID PANEL
CHOLESTEROL: 149 mg/dL (ref <200)
HDL: 39 mg/dL — AB (ref >40)
LDL CHOLESTEROL (CALC): 89 mg/dL
Non-HDL Cholesterol (Calc): 110 mg/dL (calc) (ref <130)
TRIGLYCERIDES: 112 mg/dL (ref <150)
Total CHOL/HDL Ratio: 3.8 (calc) (ref <5.0)

## 2018-03-03 LAB — VITAMIN D 25 HYDROXY (VIT D DEFICIENCY, FRACTURES): VIT D 25 HYDROXY: 110 ng/mL — AB (ref 30–100)

## 2018-03-03 LAB — HEMOGLOBIN A1C
Hgb A1c MFr Bld: 5.5 % of total Hgb (ref <5.7)
Mean Plasma Glucose: 111 (calc)
eAG (mmol/L): 6.2 (calc)

## 2018-03-03 LAB — CBC WITH DIFFERENTIAL/PLATELET
BASOS PCT: 1 %
Basophils Absolute: 82 cells/uL (ref 0–200)
Eosinophils Absolute: 402 cells/uL (ref 15–500)
Eosinophils Relative: 4.9 %
HCT: 45.4 % (ref 38.5–50.0)
Hemoglobin: 15.4 g/dL (ref 13.2–17.1)
Lymphs Abs: 1829 cells/uL (ref 850–3900)
MCH: 30.1 pg (ref 27.0–33.0)
MCHC: 33.9 g/dL (ref 32.0–36.0)
MCV: 88.8 fL (ref 80.0–100.0)
MONOS PCT: 12.1 %
MPV: 10.2 fL (ref 7.5–12.5)
Neutro Abs: 4895 cells/uL (ref 1500–7800)
Neutrophils Relative %: 59.7 %
PLATELETS: 266 10*3/uL (ref 140–400)
RBC: 5.11 10*6/uL (ref 4.20–5.80)
RDW: 12.4 % (ref 11.0–15.0)
TOTAL LYMPHOCYTE: 22.3 %
WBC mixed population: 992 cells/uL — ABNORMAL HIGH (ref 200–950)
WBC: 8.2 10*3/uL (ref 3.8–10.8)

## 2018-03-03 LAB — MAGNESIUM: Magnesium: 2.1 mg/dL (ref 1.5–2.5)

## 2018-06-25 NOTE — Progress Notes (Signed)
FOLLOW UP  Assessment and Plan:   Hypertension Well controlled  Monitor blood pressure at home; patient to call if consistently greater than 130/80 Continue DASH diet.   Reminder to go to the ER if any CP, SOB, nausea, dizziness, severe HA, changes vision/speech, left arm numbness and tingling and jaw pain.  Cholesterol Currently at goal;  Continue low cholesterol diet and exercise.  Check lipid panel.   History of elevated glucose Recent A1Cs at goal Discussed diet/exercise, weight management  Defer A1C; check BMP  BMI 23 Continue to recommend diet heavy in fruits and veggies and low in animal meats, cheeses, and dairy products, appropriate calorie intake Discuss exercise recommendations routinely Continue to monitor weight at each visit  Vitamin D Def Above goal at last visit; continue supplementation to maintain goal of 70-100 Recheck Vit D level  Continue diet and meds as discussed. Further disposition pending results of labs. Discussed med's effects and SE's.   Over 30 minutes of exam, counseling, chart review, and critical decision making was performed.   Future Appointments  Date Time Provider [place]  10/02/2018 11:00 AM Unk Pinto, MD [facility] None    ----------------------------------------------------------------------------------------------------------------------  HPI 82 y.o. male  presents for 3 month follow up on hypertension, cholesterol, glucose management, weight and vitamin D deficiency.   BMI is Body mass index is 22.95 kg/m., he has been working on diet and exercise, golfs or walks 3 miles multiple times every week.  Wt Readings from Last 3 Encounters:  06/26/18 158 lb 12.8 oz (72 kg)  03/02/18 165 lb (74.8 kg)  11/28/17 160 lb (72.6 kg)   His blood pressure has been controlled at home, today their BP is BP: 120/78  He does workout. He denies chest pain, shortness of breath, dizziness.   He is on cholesterol medication  (atorvastatin 20 mg daily) and denies myalgias. His cholesterol is at goal. The cholesterol last visit was:   Lab Results  Component Value Date   CHOL 149 03/02/2018   HDL 39 (L) 03/02/2018   LDLCALC 89 03/02/2018   TRIG 112 03/02/2018   CHOLHDL 3.8 03/02/2018    He has been working on diet and exercise for glucose management, and denies foot ulcerations, increased appetite, nausea, paresthesia of the feet, polydipsia, polyuria, visual disturbances, vomiting and weight loss. Last A1C in the office was:  Lab Results  Component Value Date   HGBA1C 5.5 03/02/2018   Patient is on Vitamin D supplement.   Lab Results  Component Value Date   VD25OH 110 (H) 03/02/2018        Current Medications:  Current Outpatient Medications on File Prior to Visit  Medication Sig  . Ascorbic Acid (VITAMIN C PO) Take by mouth daily.  [name] aspirin 81 MG tablet Take 81 mg by mouth daily.  [name] atorvastatin (LIPITOR) 40 MG tablet TAKE 1 TABLET BY MOUTH DAILY  . Cholecalciferol (VITAMIN D PO) Take by mouth daily.  [name] CINNAMON PO Take 1,000 mg by mouth daily.  . Coenzyme Q10 200 MG TABS Take 200 mg by mouth daily.  . fish oil-omega-3 fatty acids 1000 MG capsule Take 2 g by mouth daily.  . Flaxseed, Linseed, (FLAXSEED OIL PO) Take 1,000 mg by mouth daily.  . Magnesium 250 MG TABS Take 250 mg by mouth daily.  . Multiple Vitamins-Minerals (MULTIVITAMIN PO) Take by mouth daily.  [name] PROCTOZONE-HC 2.5 % rectal cream USE RECTALLY TWICE DAILY AS DIRECTED  . pseudoephedrine (SUDAFED) 30 MG tablet Take 30 mg  by mouth every 4 (four) hours as needed for congestion.  . vitamin C (ASCORBIC ACID) 500 MG tablet Take 500 mg by mouth daily.   No current facility-administered medications on file prior to visit.      Allergies: Not on File   Medical History:  Past Medical History:  Diagnosis Date  . Cancer [facility])    prostate - basal cell  . GERD (gastroesophageal reflux disease)   . Hearing loss    wear hearing aid  .  Hyperlipidemia   . Hypertension   . Inguinal hernia   . No pertinent past medical history   . Prediabetes   . Vitamin D deficiency   . Wears glasses   . Wears hearing aid    both ears   Family history- Reviewed and unchanged Social history- Reviewed and unchanged   Review of Systems:  Review of Systems  Constitutional: Negative for malaise/fatigue and weight loss.  HENT: Negative for hearing loss and tinnitus.   Eyes: Negative for blurred vision and double vision.  Respiratory: Negative for cough, shortness of breath and wheezing.   Cardiovascular: Negative for chest pain, palpitations, orthopnea, claudication and leg swelling.  Gastrointestinal: Negative for abdominal pain, blood in stool, constipation, diarrhea, heartburn, melena, nausea and vomiting.  Genitourinary: Negative.   Musculoskeletal: Negative for joint pain and myalgias.  Skin: Negative for rash.  Neurological: Negative for dizziness, tingling, sensory change, weakness and headaches.  Endo/Heme/Allergies: Negative for polydipsia.  Psychiatric/Behavioral: Negative.   All other systems reviewed and are negative.     Physical Exam: BP 120/78   Pulse 66   Temp 97.8 F (36.6 C)   Resp 14   Ht 5' 9.75" (1.772 m)   Wt 158 lb 12.8 oz (72 kg)   SpO2 96%   BMI 22.95 kg/m  Wt Readings from Last 3 Encounters:  06/26/18 158 lb 12.8 oz (72 kg)  03/02/18 165 lb (74.8 kg)  11/28/17 160 lb (72.6 kg)   General Appearance: Well nourished, in no apparent distress. Eyes: PERRLA, EOMs, conjunctiva no swelling or erythema Sinuses: No Frontal/maxillary tenderness ENT/Mouth: Ext aud canals clear, TMs without erythema, bulging. No erythema, swelling, or exudate on post pharynx.  Tonsils not swollen or erythematous. Hearing normal.  Neck: Supple, thyroid normal.  Respiratory: Respiratory effort normal, BS equal bilaterally without rales, rhonchi, wheezing or stridor.  Cardio: RRR with no MRGs. Brisk peripheral pulses without  edema.  Abdomen: Soft, + BS.  Non tender, no guarding, rebound, hernias, masses. Lymphatics: Non tender without lymphadenopathy.  Musculoskeletal: Full ROM, 5/5 strength, Normal gait Skin: Warm, dry without rashes, lesions, ecchymosis.  Neuro: Cranial nerves intact. No cerebellar symptoms.  Psych: Awake and oriented X 3, normal affect, Insight and Judgment appropriate.    Izora Ribas, NP 11:07 AM [facility]

## 2018-06-26 ENCOUNTER — Ambulatory Visit (INDEPENDENT_AMBULATORY_CARE_PROVIDER_SITE_OTHER): Payer: Medicare Other | Admitting: Adult Health

## 2018-06-26 ENCOUNTER — Encounter: Payer: Self-pay | Admitting: Adult Health

## 2018-06-26 VITALS — BP 120/78 | HR 66 | Temp 97.8°F | Resp 14 | Ht 69.75 in | Wt 158.8 lb

## 2018-06-26 DIAGNOSIS — E782 Mixed hyperlipidemia: Secondary | ICD-10-CM

## 2018-06-26 DIAGNOSIS — E559 Vitamin D deficiency, unspecified: Secondary | ICD-10-CM

## 2018-06-26 DIAGNOSIS — I10 Essential (primary) hypertension: Secondary | ICD-10-CM

## 2018-06-26 DIAGNOSIS — Z79899 Other long term (current) drug therapy: Secondary | ICD-10-CM

## 2018-06-26 DIAGNOSIS — Z8639 Personal history of other endocrine, nutritional and metabolic disease: Secondary | ICD-10-CM | POA: Diagnosis not present

## 2018-06-26 DIAGNOSIS — Z6823 Body mass index (BMI) 23.0-23.9, adult: Secondary | ICD-10-CM | POA: Diagnosis not present

## 2018-06-26 NOTE — Patient Instructions (Addendum)
Aim for 7+ servings of fruits and vegetables daily  80+ fluid ounces of water or unsweet tea for healthy kidneys  Limit animal fats in diet for cholesterol and heart health - choose grass fed whenever available  Aim for low stress - take time to unwind and care for your mental health  Aim for 150 min of moderate intensity exercise weekly for heart health, and weights twice weekly for bone health  Aim for 7-9 hours of sleep daily      When it comes to diets, agreement about the perfect plan isn't easy to find, even among the experts. Experts at the [employer] developed an idea known as the Healthy Eating Plate. Just imagine a plate divided into logical, healthy portions.  The emphasis is on diet quality:  Load up on vegetables and fruits - one-half of your plate: Aim for color and variety, and remember that potatoes don't count.  Go for whole grains - one-quarter of your plate: Whole wheat, barley, wheat berries, quinoa, oats, brown rice, and foods made with them. If you want pasta, go with whole wheat pasta.  Protein power - one-quarter of your plate: Fish, chicken, beans, and nuts are all healthy, versatile protein sources. Limit red meat.  The diet, however, does go beyond the plate, offering a few other suggestions.  Use healthy plant oils, such as olive, canola, soy, corn, sunflower and peanut. Check the labels, and avoid partially hydrogenated oil, which have unhealthy trans fats.  If you're thirsty, drink water. Coffee and tea are good in moderation, but skip sugary drinks and limit milk and dairy products to one or two daily servings.  The type of carbohydrate in the diet is more important than the amount. Some sources of carbohydrates, such as vegetables, fruits, whole grains, and beans-are healthier than others.  Finally, stay active.   

## 2018-06-27 LAB — CBC WITH DIFFERENTIAL/PLATELET
BASOS ABS: 68 {cells}/uL (ref 0–200)
Basophils Relative: 0.8 %
EOS PCT: 4.4 %
Eosinophils Absolute: 374 cells/uL (ref 15–500)
HEMATOCRIT: 46.5 % (ref 38.5–50.0)
Hemoglobin: 15.7 g/dL (ref 13.2–17.1)
Lymphs Abs: 2074 cells/uL (ref 850–3900)
MCH: 30.3 pg (ref 27.0–33.0)
MCHC: 33.8 g/dL (ref 32.0–36.0)
MCV: 89.6 fL (ref 80.0–100.0)
MPV: 10.6 fL (ref 7.5–12.5)
Monocytes Relative: 9.6 %
NEUTROS PCT: 60.8 %
Neutro Abs: 5168 cells/uL (ref 1500–7800)
PLATELETS: 262 10*3/uL (ref 140–400)
RBC: 5.19 10*6/uL (ref 4.20–5.80)
RDW: 13 % (ref 11.0–15.0)
TOTAL LYMPHOCYTE: 24.4 %
WBC mixed population: 816 cells/uL (ref 200–950)
WBC: 8.5 10*3/uL (ref 3.8–10.8)

## 2018-06-27 LAB — COMPLETE METABOLIC PANEL WITH GFR
AG RATIO: 1.6 (calc) (ref 1.0–2.5)
ALKALINE PHOSPHATASE (APISO): 74 U/L (ref 40–115)
ALT: 21 U/L (ref 9–46)
AST: 25 U/L (ref 10–35)
Albumin: 4.4 g/dL (ref 3.6–5.1)
BUN/Creatinine Ratio: 15 (calc) (ref 6–22)
BUN: 19 mg/dL (ref 7–25)
CALCIUM: 10.2 mg/dL (ref 8.6–10.3)
CO2: 29 mmol/L (ref 20–32)
Chloride: 105 mmol/L (ref 98–110)
Creat: 1.29 mg/dL — ABNORMAL HIGH (ref 0.70–1.11)
GFR, EST NON AFRICAN AMERICAN: 52 mL/min/{1.73_m2} — AB (ref >=60)
GFR, Est African American: 60 mL/min/{1.73_m2} (ref >=60)
GLOBULIN: 2.7 g/dL (ref 1.9–3.7)
Glucose, Bld: 90 mg/dL (ref 65–99)
POTASSIUM: 5.4 mmol/L — AB (ref 3.5–5.3)
SODIUM: 145 mmol/L (ref 135–146)
Total Bilirubin: 0.8 mg/dL (ref 0.2–1.2)
Total Protein: 7.1 g/dL (ref 6.1–8.1)

## 2018-06-27 LAB — LIPID PANEL
CHOLESTEROL: 168 mg/dL (ref <200)
HDL: 44 mg/dL (ref >40)
LDL Cholesterol (Calc): 97 mg/dL (calc)
NON-HDL CHOLESTEROL (CALC): 124 mg/dL (ref <130)
TRIGLYCERIDES: 173 mg/dL — AB (ref <150)
Total CHOL/HDL Ratio: 3.8 (calc) (ref <5.0)

## 2018-06-27 LAB — TSH: TSH: 3.55 mIU/L (ref 0.40–4.50)

## 2018-06-27 LAB — VITAMIN D 25 HYDROXY (VIT D DEFICIENCY, FRACTURES): Vit D, 25-Hydroxy: 106 ng/mL — ABNORMAL HIGH (ref 30–100)

## 2018-07-08 DIAGNOSIS — L255 Unspecified contact dermatitis due to plants, except food: Secondary | ICD-10-CM | POA: Diagnosis not present

## 2018-07-21 ENCOUNTER — Other Ambulatory Visit: Payer: Self-pay | Admitting: Internal Medicine

## 2018-08-07 ENCOUNTER — Ambulatory Visit (INDEPENDENT_AMBULATORY_CARE_PROVIDER_SITE_OTHER): Payer: Medicare Other

## 2018-08-07 VITALS — Ht 69.0 in | Wt 159.0 lb

## 2018-08-07 DIAGNOSIS — S99929A Unspecified injury of unspecified foot, initial encounter: Secondary | ICD-10-CM

## 2018-08-07 DIAGNOSIS — Z23 Encounter for immunization: Secondary | ICD-10-CM | POA: Diagnosis not present

## 2018-08-07 NOTE — Progress Notes (Signed)
Pt reports he was taking down a old tree house and stepped on a rusty nail. Pt reports for TETANUS vaccine in right arm w/o concerns or questions at this time.

## 2018-08-30 DIAGNOSIS — C44629 Squamous cell carcinoma of skin of left upper limb, including shoulder: Secondary | ICD-10-CM | POA: Diagnosis not present

## 2018-08-30 DIAGNOSIS — C44311 Basal cell carcinoma of skin of nose: Secondary | ICD-10-CM | POA: Diagnosis not present

## 2018-08-30 DIAGNOSIS — L821 Other seborrheic keratosis: Secondary | ICD-10-CM | POA: Diagnosis not present

## 2018-08-30 DIAGNOSIS — L570 Actinic keratosis: Secondary | ICD-10-CM | POA: Diagnosis not present

## 2018-08-30 DIAGNOSIS — Z85828 Personal history of other malignant neoplasm of skin: Secondary | ICD-10-CM | POA: Diagnosis not present

## 2018-08-30 DIAGNOSIS — D485 Neoplasm of uncertain behavior of skin: Secondary | ICD-10-CM | POA: Diagnosis not present

## 2018-08-30 DIAGNOSIS — C44622 Squamous cell carcinoma of skin of right upper limb, including shoulder: Secondary | ICD-10-CM | POA: Diagnosis not present

## 2018-08-31 ENCOUNTER — Encounter: Payer: Self-pay | Admitting: Internal Medicine

## 2018-10-01 ENCOUNTER — Encounter: Payer: Self-pay | Admitting: Internal Medicine

## 2018-10-01 NOTE — Patient Instructions (Signed)

## 2018-10-01 NOTE — Progress Notes (Signed)
[facility]   Unk Pinto, M.D.     [name]. Brian Proctor, P.A.-C Liane Comber, [place]                [address] [place] [address] Telephone [phone] Telefax [phone]  Comprehensive Evaluation & Examination     This very nice 82 y.o. [name]  presents for a  comprehensive evaluation and management of multiple medical co-morbidities.  Patient has been followed for HTN, HLD, Prediabetes and Vitamin D Deficiency. Patient has hx/o GERD controlled with diet.    In 2000 he underwent suprapubic prostatectomy by Dr Risa Grill and has had no known recurrence.       Patient has been followed for Labile HTN predates circa 2005. Patient's BP has been controlled at home.  Today's BP is at goal 114/80. Patient denies any cardiac symptoms as chest pain, palpitations, shortness of breath, dizziness or ankle swelling.     Patient's hyperlipidemia is controlled with diet and medications. Patient denies myalgias or other medication SE's. Last lipids were at goal albeit sl elevated Trig's: Lab Results  Component Value Date   CHOL 168 06/26/2018   HDL 44 06/26/2018   LDLCALC 97 06/26/2018   TRIG 173 (H) 06/26/2018   CHOLHDL 3.8 06/26/2018      Patient has hx/o prediabetes (A1c 5.7%/2013 & 5.3%/2014) and patient denies reactive hypoglycemic symptoms, visual blurring, diabetic polys or paresthesias. Last A1c was  Lab Results  Component Value Date   HGBA1C 5.5 03/02/2018       Finally, patient has history of Vitamin D Deficiency ("41" on tx 2013) and last vitamin D was at goal: Lab Results  Component Value Date   VD25OH 106 (H) 06/26/2018   Current Outpatient Medications on File Prior to Visit  Medication Sig  . Ascorbic Acid (VITAMIN C PO) Take by mouth daily.  [name] aspirin 81 MG tablet Take 81 mg by mouth daily.  [name] atorvastatin (LIPITOR) 40 MG tablet TAKE 1 TABLET BY MOUTH DAILY  . Cholecalciferol  (VITAMIN D PO) Take by mouth daily.  [name] CINNAMON PO Take 1,000 mg by mouth daily.  . Coenzyme Q10 200 MG TABS Take 200 mg by mouth daily.  . fish oil-omega-3 fatty acids 1000 MG capsule Take 2 g by mouth daily.  . Flaxseed, Linseed, (FLAXSEED OIL PO) Take 1,000 mg by mouth daily.  . Magnesium 250 MG TABS Take 250 mg by mouth daily.  . Multiple Vitamins-Minerals (MULTIVITAMIN PO) Take by mouth daily.  [name] PROCTOZONE-HC 2.5 % rectal cream USE RECTALLY TWICE DAILY AS DIRECTED  . pseudoephedrine (SUDAFED) 30 MG tablet Take 30 mg by mouth every 4 (four) hours as needed for congestion.  . vitamin C (ASCORBIC ACID) 500 MG tablet Take 500 mg by mouth daily.   No current facility-administered medications on file prior to visit.    No Known Allergies Past Medical History:  Diagnosis Date  . Cancer [facility])    prostate - basal cell  . GERD (gastroesophageal reflux disease)   . Hearing loss    wear hearing aid  . Hyperlipidemia   . Hypertension   . Inguinal hernia   . No pertinent past medical history   . Prediabetes   . Vitamin D deficiency   . Wears glasses   . Wears hearing aid    both ears   Health Maintenance  Topic Date Due  . INFLUENZA VACCINE  07/27/2018  . TETANUS/TDAP  08/07/2028  . PNA vac Low Risk Adult  Completed   Immunization History  Administered Date(s) Administered  . DT 09/03/2015  . Influenza Split 10/28/2016  . Influenza, High Dose Seasonal PF 09/27/2014, 09/03/2015, 10/05/2017  . Pneumococcal Conjugate-13 10/16/2014  . Pneumococcal-Unspecified 02/03/2011  . Td 07/26/2005, 08/07/2018  . Zoster 12/27/2006   Last Colon - May 2013 - Dr Oletta Lamas - recommended no f/u due to age  Past Surgical History:  Procedure Laterality Date  . COLONOSCOPY     x2  . INGUINAL HERNIA REPAIR  01/15/2013   Procedure: HERNIA REPAIR INGUINAL ADULT;  Surgeon: Gwenyth Ober, MD;  Location: [facility];  Service: General;  Laterality: Right;  . [place] or 2000    due to cancer  . TONSILLECTOMY     as a child   Family History  Problem Relation Age of Onset  . Other Mother        bowel infarction  . COPD Father   . Heart disease Father   . Autism Son    Social History   Socioeconomic History  . Marital status: Married    Spouse name: Not on file  . Number of children: Not on file  . Years of education: Not on file  . Highest education level: Not on file  Occupational History  . Not on file  Tobacco Use  . Smoking status: Former Smoker    Last attempt to quit: 01/10/1999    Years since quitting: 19.7  . Smokeless tobacco: Never Used  Substance and Sexual Activity  . Alcohol use: No    Comment: occasional  . Drug use: No  . Sexual activity: Not on file    ROS Constitutional: Denies fever, chills, weight loss/gain, headaches, insomnia,  night sweats or change in appetite. Does c/o fatigue. Eyes: Denies redness, blurred vision, diplopia, discharge, itchy or watery eyes.  ENT: Denies discharge, congestion, post nasal drip, epistaxis, sore throat, earache, hearing loss, dental pain, Tinnitus, Vertigo, Sinus pain or snoring.  Cardio: Denies chest pain, palpitations, irregular heartbeat, syncope, dyspnea, diaphoresis, orthopnea, PND, claudication or edema Respiratory: denies cough, dyspnea, DOE, pleurisy, hoarseness, laryngitis or wheezing.  Gastrointestinal: Denies dysphagia, heartburn, reflux, water brash, pain, cramps, nausea, vomiting, bloating, diarrhea, constipation, hematemesis, melena, hematochezia, jaundice or hemorrhoids Genitourinary: Denies dysuria, frequency, urgency, nocturia, hesitancy, discharge, hematuria or flank pain Musculoskeletal: Denies arthralgia, myalgia, stiffness, Jt. Swelling, pain, limp or strain/sprain. Denies Falls. Skin: Denies puritis, rash, hives, warts, acne, eczema or change in skin lesion Neuro: No weakness, tremor, incoordination, spasms, paresthesia or pain Psychiatric: Denies confusion, memory loss  or sensory loss. Denies Depression. Endocrine: Denies change in weight, skin, hair change, nocturia, and paresthesia, diabetic polys, visual blurring or hyper / hypo glycemic episodes.  Heme/Lymph: No excessive bleeding, bruising or enlarged lymph nodes.  Physical Exam  BP 114/80   Pulse 72   Temp (!) 97.2 F (36.2 C)   Resp 16   Ht 5\' 10"  (1.778 m)   Wt 159 lb (72.1 kg)   BMI 22.81 kg/m   General Appearance: Well nourished and well groomed and in no apparent distress.  Eyes: PERRLA, EOMs, conjunctiva no swelling or erythema, normal fundi and vessels. Sinuses: No frontal/maxillary tenderness ENT/Mouth: EACs patent / TMs  nl. Nares clear without erythema, swelling, mucoid exudates. Oral hygiene is good. No erythema, swelling, or exudate. Tongue normal, non-obstructing. Tonsils not swollen or erythematous.  Hearing normal.  Neck: Supple, thyroid not palpable. No bruits, nodes or JVD. Respiratory: Respiratory effort normal.  BS equal and clear bilateral without rales, rhonci, wheezing or stridor. Cardio: Heart sounds are normal with regular rate and rhythm and no murmurs, rubs or gallops. Peripheral pulses are normal and equal bilaterally without edema. No aortic or femoral bruits. Chest: symmetric with normal excursions and percussion.  Abdomen: Soft, with Nl bowel sounds. Nontender, no guarding, rebound, hernias, masses, or organomegaly.  Lymphatics: Non tender without lymphadenopathy.  Genitourinary: No hernias.Testes nl. DRE - prostate nl for age - smooth & firm w/o nodules. Musculoskeletal: Full ROM all peripheral extremities, joint stability, 5/5 strength, and normal gait. Skin: Warm and dry without rashes, lesions, cyanosis, clubbing or  ecchymosis.  Neuro: Cranial nerves intact, reflexes equal bilaterally. Normal muscle tone, no cerebellar symptoms. Sensation intact.  Pysch: Alert and oriented X 3 with normal affect, insight and judgment appropriate.   Assessment and Plan  1.  Essential hypertension  - EKG 12-Lead - [place], RETROPERITNL ABD,  LTD - Urinalysis, Routine w reflex microscopic - Microalbumin / creatinine urine ratio - CBC with Differential/Platelet - COMPLETE METABOLIC PANEL WITH GFR - Magnesium - TSH  2. Hyperlipidemia, mixed  - EKG 12-Lead - [place], RETROPERITNL ABD,  LTD - Lipid panel - TSH  3. Abnormal glucose  - EKG 12-Lead - [place], RETROPERITNL ABD,  LTD - Hemoglobin A1c - Insulin, random  4. Vitamin D deficiency  - VITAMIN D 25 Hydroxy  5. Prediabetes  - EKG 12-Lead - [place], RETROPERITNL ABD,  LTD - Hemoglobin A1c - Insulin, random  6. Gastroesophageal reflux disease  - CBC with Differential/Platelet  7. Screening for colorectal cancer  - POC Hemoccult Bld/Stl  8. BPH with obstruction/lower urinary tract symptoms  - PSA  9. Personal history of prostate cancer  - PSA  10. Special screening, prostate cancer  - PSA  11. Screening for ischemic heart disease  - EKG 12-Lead  12. FHx: heart disease  - EKG 12-Lead - [place], RETROPERITNL ABD,  LTD  13. Former smoker  - EKG 12-Lead - [place], RETROPERITNL ABD,  LTD  14. Screening for AAA (aortic abdominal aneurysm)  - [place], RETROPERITNL ABD,  LTD  15. Medication management  - Urinalysis, Routine w reflex microscopic - Microalbumin / creatinine urine ratio - CBC with Differential/Platelet - COMPLETE METABOLIC PANEL WITH GFR - Magnesium - Lipid panel - TSH - Hemoglobin A1c - Insulin, random - VITAMIN D 25 Hydroxyl        Patient was counseled in prudent diet, weight control to achieve/maintain BMI less than 25, BP monitoring, regular exercise and medications as discussed.  Discussed med effects and SE's. Routine screening labs and tests as requested with regular follow-up as recommended. Over 40 minutes of exam, counseling, chart review and high complex critical decision making was performed

## 2018-10-02 ENCOUNTER — Ambulatory Visit (INDEPENDENT_AMBULATORY_CARE_PROVIDER_SITE_OTHER): Payer: Medicare Other | Admitting: Internal Medicine

## 2018-10-02 VITALS — BP 114/80 | HR 72 | Temp 97.2°F | Resp 16 | Ht 70.0 in | Wt 159.0 lb

## 2018-10-02 DIAGNOSIS — E782 Mixed hyperlipidemia: Secondary | ICD-10-CM

## 2018-10-02 DIAGNOSIS — Z1211 Encounter for screening for malignant neoplasm of colon: Secondary | ICD-10-CM

## 2018-10-02 DIAGNOSIS — R7303 Prediabetes: Secondary | ICD-10-CM

## 2018-10-02 DIAGNOSIS — E559 Vitamin D deficiency, unspecified: Secondary | ICD-10-CM

## 2018-10-02 DIAGNOSIS — R7309 Other abnormal glucose: Secondary | ICD-10-CM | POA: Diagnosis not present

## 2018-10-02 DIAGNOSIS — Z125 Encounter for screening for malignant neoplasm of prostate: Secondary | ICD-10-CM

## 2018-10-02 DIAGNOSIS — N138 Other obstructive and reflux uropathy: Secondary | ICD-10-CM

## 2018-10-02 DIAGNOSIS — Z79899 Other long term (current) drug therapy: Secondary | ICD-10-CM | POA: Diagnosis not present

## 2018-10-02 DIAGNOSIS — N401 Enlarged prostate with lower urinary tract symptoms: Secondary | ICD-10-CM

## 2018-10-02 DIAGNOSIS — Z136 Encounter for screening for cardiovascular disorders: Secondary | ICD-10-CM | POA: Diagnosis not present

## 2018-10-02 DIAGNOSIS — Z1212 Encounter for screening for malignant neoplasm of rectum: Secondary | ICD-10-CM

## 2018-10-02 DIAGNOSIS — Z87891 Personal history of nicotine dependence: Secondary | ICD-10-CM

## 2018-10-02 DIAGNOSIS — K219 Gastro-esophageal reflux disease without esophagitis: Secondary | ICD-10-CM | POA: Diagnosis not present

## 2018-10-02 DIAGNOSIS — Z8249 Family history of ischemic heart disease and other diseases of the circulatory system: Secondary | ICD-10-CM | POA: Diagnosis not present

## 2018-10-02 DIAGNOSIS — Z8546 Personal history of malignant neoplasm of prostate: Secondary | ICD-10-CM

## 2018-10-02 DIAGNOSIS — I10 Essential (primary) hypertension: Secondary | ICD-10-CM | POA: Diagnosis not present

## 2018-10-03 LAB — CBC WITH DIFFERENTIAL/PLATELET
Basophils Absolute: 70 cells/uL (ref 0–200)
Basophils Relative: 0.8 %
Eosinophils Absolute: 322 cells/uL (ref 15–500)
Eosinophils Relative: 3.7 %
HEMATOCRIT: 46 % (ref 38.5–50.0)
HEMOGLOBIN: 15.4 g/dL (ref 13.2–17.1)
LYMPHS ABS: 1879 {cells}/uL (ref 850–3900)
MCH: 30.7 pg (ref 27.0–33.0)
MCHC: 33.5 g/dL (ref 32.0–36.0)
MCV: 91.8 fL (ref 80.0–100.0)
MPV: 10.3 fL (ref 7.5–12.5)
Monocytes Relative: 7.7 %
NEUTROS PCT: 66.2 %
Neutro Abs: 5759 cells/uL (ref 1500–7800)
Platelets: 277 10*3/uL (ref 140–400)
RBC: 5.01 10*6/uL (ref 4.20–5.80)
RDW: 12.7 % (ref 11.0–15.0)
Total Lymphocyte: 21.6 %
WBC: 8.7 10*3/uL (ref 3.8–10.8)
WBCMIX: 670 {cells}/uL (ref 200–950)

## 2018-10-03 LAB — HEMOGLOBIN A1C
HEMOGLOBIN A1C: 5.5 %{Hb} (ref <5.7)
MEAN PLASMA GLUCOSE: 111 (calc)
eAG (mmol/L): 6.2 (calc)

## 2018-10-03 LAB — LIPID PANEL
Cholesterol: 165 mg/dL (ref <200)
HDL: 41 mg/dL (ref >40)
LDL CHOLESTEROL (CALC): 95 mg/dL
Non-HDL Cholesterol (Calc): 124 mg/dL (calc) (ref <130)
Total CHOL/HDL Ratio: 4 (calc) (ref <5.0)
Triglycerides: 193 mg/dL — ABNORMAL HIGH (ref <150)

## 2018-10-03 LAB — URINALYSIS, ROUTINE W REFLEX MICROSCOPIC
BILIRUBIN URINE: NEGATIVE
Bacteria, UA: NONE SEEN /HPF
Glucose, UA: NEGATIVE
HGB URINE DIPSTICK: NEGATIVE
HYALINE CAST: NONE SEEN /LPF
Leukocytes, UA: NEGATIVE
Nitrite: NEGATIVE
RBC / HPF: NONE SEEN /HPF (ref 0–2)
SQUAMOUS EPITHELIAL / LPF: NONE SEEN /HPF (ref <=5)
Specific Gravity, Urine: 1.024 (ref 1.001–1.03)
WBC, UA: NONE SEEN /HPF (ref 0–5)
pH: 5.5 (ref 5.0–8.0)

## 2018-10-03 LAB — COMPLETE METABOLIC PANEL WITH GFR
AG RATIO: 1.7 (calc) (ref 1.0–2.5)
ALT: 22 U/L (ref 9–46)
AST: 24 U/L (ref 10–35)
Albumin: 4.3 g/dL (ref 3.6–5.1)
Alkaline phosphatase (APISO): 68 U/L (ref 40–115)
BUN: 16 mg/dL (ref 7–25)
CALCIUM: 9.5 mg/dL (ref 8.6–10.3)
CO2: 28 mmol/L (ref 20–32)
Chloride: 107 mmol/L (ref 98–110)
Creat: 1.03 mg/dL (ref 0.70–1.11)
GFR, Est African American: 79 mL/min/{1.73_m2} (ref >=60)
GFR, Est Non African American: 68 mL/min/{1.73_m2} (ref >=60)
Globulin: 2.5 g/dL (calc) (ref 1.9–3.7)
Glucose, Bld: 109 mg/dL — ABNORMAL HIGH (ref 65–99)
POTASSIUM: 4.8 mmol/L (ref 3.5–5.3)
Sodium: 146 mmol/L (ref 135–146)
Total Bilirubin: 0.5 mg/dL (ref 0.2–1.2)
Total Protein: 6.8 g/dL (ref 6.1–8.1)

## 2018-10-03 LAB — MICROALBUMIN / CREATININE URINE RATIO
Creatinine, Urine: 240 mg/dL (ref 20–320)
Microalb Creat Ratio: 22 mcg/mg creat (ref <30)
Microalb, Ur: 5.3 mg/dL

## 2018-10-03 LAB — INSULIN, RANDOM: Insulin: 48.7 u[IU]/mL — ABNORMAL HIGH (ref 2.0–19.6)

## 2018-10-03 LAB — MAGNESIUM: Magnesium: 2.2 mg/dL (ref 1.5–2.5)

## 2018-10-03 LAB — TSH: TSH: 2.96 m[IU]/L (ref 0.40–4.50)

## 2018-10-03 LAB — PSA: PSA: <0.1 ng/mL (ref <=4.0)

## 2018-10-03 LAB — VITAMIN D 25 HYDROXY (VIT D DEFICIENCY, FRACTURES): VIT D 25 HYDROXY: 65 ng/mL (ref 30–100)

## 2018-10-10 ENCOUNTER — Other Ambulatory Visit: Payer: Self-pay

## 2018-10-10 DIAGNOSIS — Z1212 Encounter for screening for malignant neoplasm of rectum: Principal | ICD-10-CM

## 2018-10-10 DIAGNOSIS — Z1211 Encounter for screening for malignant neoplasm of colon: Secondary | ICD-10-CM

## 2018-10-10 LAB — POC HEMOCCULT BLD/STL (HOME/3-CARD/SCREEN)
Card #2 Fecal Occult Blod, POC: NEGATIVE
Card #3 Fecal Occult Blood, POC: NEGATIVE
FECAL OCCULT BLD: NEGATIVE

## 2018-10-11 DIAGNOSIS — Z23 Encounter for immunization: Secondary | ICD-10-CM | POA: Diagnosis not present

## 2018-10-30 DIAGNOSIS — C44319 Basal cell carcinoma of skin of other parts of face: Secondary | ICD-10-CM | POA: Diagnosis not present

## 2018-10-30 DIAGNOSIS — Z85828 Personal history of other malignant neoplasm of skin: Secondary | ICD-10-CM | POA: Diagnosis not present

## 2018-10-30 DIAGNOSIS — D485 Neoplasm of uncertain behavior of skin: Secondary | ICD-10-CM | POA: Diagnosis not present

## 2018-11-24 ENCOUNTER — Other Ambulatory Visit: Payer: Self-pay | Admitting: Internal Medicine

## 2018-12-08 NOTE — Progress Notes (Signed)
MEDICARE ANNUAL WELLNESS VISIT AND FOLLOW UP Assessment:   Diagnoses and all orders for this visit:  Encounter for Medicare annual wellness exam  Essential hypertension Somewhat labile; previously well controlled by lifestyle with a single recent elevated reading Monitor blood pressure at home; call if consistently over 130/80 Continue DASH diet.   Reminder to go to the ER if any CP, SOB, nausea, dizziness, severe HA, changes vision/speech, left arm numbness and tingling and jaw pain.  Gastroesophageal reflux disease, esophagitis presence not specified Currently well controlled with lifestyle changes - avoiding triggers, excessively large meals, lying down after eating OTC agents for rare symptoms; contact office if frequency increases  History of prostate cancer S/p prostatectomy 1999; no issues or suspected recurrence. Some mild stress incontinence in past year - wear pad to golf. Predictable symptoms not significantly interfering with life. Continue to monitor.   Mixed hyperlipidemia Continue medications and supplements Continue low cholesterol diet and exercise.  Check lipid panel.   History of elevated glucose Previously prediabetic currently well controlled with lifestyle changes Encouraged to continue with lifestyle Monitor A1C as needed; defer today, check CMP  Vitamin D deficiency Continue supplementation and routine monitoring At goal at recent check; continue to recommend supplementation for goal of 70-100 Defer vitamin D level  History of basal cell carcinoma Continue follow up with derm q6 months or as recommended; wear sunscreen when outdoors and reapply regularly.  Body mass index (BMI) of 22.0-22.9 in adult Rec. Maintain weight with balanced diet and exercise  Former smoker/40+ pack year hx No sx, last cxr 2014, will follow up today  Over 30 minutes of exam, counseling, chart review, and critical decision making was performed  Future Appointments  Date  Time Provider Oliver  04/11/2019 10:30 AM Unk Pinto, MD [facility] None  10/24/2019 10:00 AM Unk Pinto, MD [facility] None     Plan:   During the course of the visit the patient was educated and counseled about appropriate screening and preventive services including:    Pneumococcal vaccine   Influenza vaccine  Prevnar 13  Td vaccine  Screening electrocardiogram  Colorectal cancer screening  Diabetes screening  Glaucoma screening  Nutrition counseling    Subjective:  Brian Proctor is a 82 y.o. male who presents for Medicare Annual Wellness Visit and 3 month follow up for HTN, hyperlipidemia, history prediabetes, and vitamin D Def.   His various medical conditions are very well controlled - most managed by lifestyle modification. He continues to walk 60 min 3-4 times a week without issues, golfs and does regular yard work. S/p prostatectomy for CA in 1999 - no issues, but endorses some mild predictable stress incontinence for which he wears a pad when golfing.   He has hx of basal cell skin CA for which he sees dermatology q2m for monitoring.   GERD well controlled by lifestyle modification; uses alka-seltzer for rare symptoms. He does endorse increased belching but denies other symptoms. He feels well controlled with current regimen.   BMI is Body mass index is 24.19 kg/m., he has been working on diet and exercise. Wt Readings from Last 3 Encounters:  12/11/18 168 lb 9.6 oz (76.5 kg)  10/02/18 159 lb (72.1 kg)  08/07/18 159 lb (72.1 kg)   His blood pressure has been controlled at home, today their BP is BP: 114/70 He does workout. He denies chest pain, shortness of breath, dizziness.   He is on cholesterol medication (atorvastatatin 20 mg daily), on omega 3 supplement and  lifestyle changes. His cholesterol is not at goal. The cholesterol last visit was:  Lab Results  Component Value Date   CHOL 165 10/02/2018   HDL 41 10/02/2018    LDLCALC 95 10/02/2018   TRIG 193 (H) 10/02/2018   CHOLHDL 4.0 10/02/2018   He has been working on diet and exercise for history of prediabetes, and denies increased appetite, nausea, paresthesia of the feet, polydipsia, polyuria and visual disturbances. Last A1C in the office was:  Lab Results  Component Value Date   HGBA1C 5.5 10/02/2018   Last GFR Lab Results  Component Value Date   GFRNONAA 68 10/02/2018    Patient is on Vitamin D supplement.   Lab Results  Component Value Date   VD25OH 65 10/02/2018      Medication Review: Current Outpatient Medications on File Prior to Visit  Medication Sig Dispense Refill  . Ascorbic Acid (VITAMIN C PO) Take by mouth daily.    [name] aspirin 81 MG tablet Take 81 mg by mouth daily.    [name] atorvastatin (LIPITOR) 40 MG tablet TAKE 1 TABLET BY MOUTH DAILY 90 tablet 0  . Cholecalciferol (VITAMIN D PO) Take by mouth daily.    [name] CINNAMON PO Take 1,000 mg by mouth daily.    . Coenzyme Q10 200 MG TABS Take 200 mg by mouth daily.    . fish oil-omega-3 fatty acids 1000 MG capsule Take 2 g by mouth daily.    . Flaxseed, Linseed, (FLAXSEED OIL PO) Take 1,000 mg by mouth daily.    . hydrocortisone (PROCTO-MED HC) 2.5 % rectal cream User 3 to 4 x / day as needed 30 g 11  . Magnesium 250 MG TABS Take 250 mg by mouth daily.    . Multiple Vitamins-Minerals (MULTIVITAMIN PO) Take by mouth daily.    . pseudoephedrine (SUDAFED) 30 MG tablet Take 30 mg by mouth every 4 (four) hours as needed for congestion.    . vitamin C (ASCORBIC ACID) 500 MG tablet Take 500 mg by mouth daily.     No current facility-administered medications on file prior to visit.     Allergies: No Known Allergies  Current Problems (verified) has Hyperlipidemia; Hypertension; GERD (gastroesophageal reflux disease); History of elevated glucose; Vitamin D deficiency; Medication management; History of prostate cancer; Encounter for Medicare annual wellness exam; BMI 22.0-22.9, adult; History of  basal cell carcinoma; Former smoker; and FH: heart disease on their problem list.  Screening Tests Immunization History  Administered Date(s) Administered  . DT 09/03/2015  . Influenza Inj Mdck Quad Pf 11/10/2018  . Influenza Split 10/28/2016  . Influenza, High Dose Seasonal PF 09/27/2014, 09/03/2015, 10/05/2017  . Pneumococcal Conjugate-13 10/16/2014  . Pneumococcal-Unspecified 02/03/2011  . Td 07/26/2005, 08/07/2018  . Zoster 12/27/2006   Preventative care: Last colonoscopy: 2015  Hemoccult 09/2018 CXR 2014  Prior vaccinations: TD or Tdap: 2016  Influenza: 2019 Pneumococcal: 2012 Prevnar13: 2015 Shingles/Zostavax: 2008  Names of Other Physician/Practitioners you currently use: 1. [facility] here for primary care 2. [facility], eye doctor, last visit 2016, wears glasses, no issues 3. [facility], dentist, last visit 2019 - goes q6 months  Sees dermatology q 6 months   Patient Care Team: Unk Pinto, MD as PCP - General (Internal Medicine)  Surgical: He  has a past surgical history that includes Tonsillectomy; Prostate surgery (1999 or 2000); Colonoscopy; and Inguinal hernia repair (01/15/2013). Family His family history includes Autism in his son; COPD in his father; Heart  disease in his father; Other in his mother. Social history  He reports that he quit smoking about 19 years ago. He has never used smokeless tobacco. He reports that he does not drink alcohol or use drugs.  MEDICARE WELLNESS OBJECTIVES: Physical activity: Current Exercise Habits: Home exercise routine, Type of exercise: Other - see comments;walking(golf), Time (Minutes): 60, Frequency (Times/Week): 3, Weekly Exercise (Minutes/Week): 180, Intensity: Mild, Exercise limited by: None identified Cardiac risk factors: Cardiac Risk Factors include: advanced age (>10men, >64 women);male gender;dyslipidemia;hypertension;smoking/ tobacco  exposure Depression/mood screen:   Depression screen [facility] 2/9 12/11/2018  Decreased Interest 0  Down, Depressed, Hopeless 0  PHQ - 2 Score 0    ADLs:  In your present state of health, do you have any difficulty performing the following activities: 12/11/2018 10/01/2018  Hearing? N N  Comment has bilateral hearing aids -  Vision? N N  Difficulty concentrating or making decisions? N N  Walking or climbing stairs? N N  Dressing or bathing? N N  Doing errands, shopping? N N  Some recent data might be hidden     Cognitive Testing  Alert? Yes  Normal Appearance?Yes  Oriented to person? Yes  Place? Yes   Time? Yes  Recall of three objects?  Yes  Can perform simple calculations? Yes  Displays appropriate judgment?Yes  Can read the correct time from a watch face?Yes  EOL planning: Does Patient Have a Medical Advance Directive?: Yes Type of Advance Directive: Healthcare Power of Attorney, Living will Does patient want to make changes to medical advance directive?: Yes (MAU/Ambulatory/Procedural Areas - Information given) Copy of [place] in Chart?: No - copy requested   Objective:   Today's Vitals   12/11/18 0950  BP: 114/70  Pulse: 69  Temp: (!) 97.5 F (36.4 C)  SpO2: 98%  Weight: 168 lb 9.6 oz (76.5 kg)  Height: 5\' 10"  (1.778 m)   Body mass index is 24.19 kg/m.  General appearance: alert, no distress, WD/WN, male HEENT: normocephalic, sclerae anicteric, TMs pearly, nares patent, no discharge or erythema, pharynx normal Oral cavity: MMM, no lesions Neck: supple, no lymphadenopathy, no thyromegaly, no masses Heart: RRR, normal S1, S2, no murmurs Lungs: CTA bilaterally, no wheezes, rhonchi, or rales Abdomen: +bs, soft, non tender, non distended, no masses, no hepatomegaly, no splenomegaly Musculoskeletal: nontender, no swelling, no obvious deformity Extremities: no edema, no cyanosis, no clubbing Pulses: 2+ symmetric, upper and lower extremities,  normal cap refill Neurological: alert, oriented x 3, CN2-12 intact, strength normal upper extremities and lower extremities, sensation normal throughout, DTRs 2+ throughout, no cerebellar signs, gait normal Psychiatric: normal affect, behavior normal, pleasant  Skin: Abrasion/scab to R shin - border mildly injected without notable heat, no discharge.   Medicare Attestation I have personally reviewed: The patient's medical and social history Their use of alcohol, tobacco or illicit drugs Their current medications and supplements The patient's functional ability including ADLs,fall risks, home safety risks, cognitive, and hearing and visual impairment Diet and physical activities Evidence for depression or mood disorders  The patient's weight, height, BMI, and visual acuity have been recorded in the chart.  I have made referrals, counseling, and provided education to the patient based on review of the above and I have provided the patient with a written personalized care plan for preventive services.     Izora Ribas, NP   12/11/2018

## 2018-12-11 ENCOUNTER — Ambulatory Visit (HOSPITAL_COMMUNITY)
Admission: RE | Admit: 2018-12-11 | Discharge: 2018-12-11 | Disposition: A | Discharge status: Home / Self Care | Payer: Medicare Other | Source: Ambulatory Visit | Attending: Adult Health | Admitting: Adult Health

## 2018-12-11 ENCOUNTER — Ambulatory Visit (INDEPENDENT_AMBULATORY_CARE_PROVIDER_SITE_OTHER): Payer: Medicare Other | Admitting: Adult Health

## 2018-12-11 ENCOUNTER — Encounter: Payer: Self-pay | Admitting: Adult Health

## 2018-12-11 VITALS — BP 114/70 | HR 69 | Temp 97.5°F | Ht 70.0 in | Wt 168.6 lb

## 2018-12-11 DIAGNOSIS — Z0000 Encounter for general adult medical examination without abnormal findings: Secondary | ICD-10-CM

## 2018-12-11 DIAGNOSIS — I10 Essential (primary) hypertension: Secondary | ICD-10-CM | POA: Insufficient documentation

## 2018-12-11 DIAGNOSIS — R6889 Other general symptoms and signs: Secondary | ICD-10-CM | POA: Diagnosis not present

## 2018-12-11 DIAGNOSIS — Z0001 Encounter for general adult medical examination with abnormal findings: Secondary | ICD-10-CM | POA: Diagnosis not present

## 2018-12-11 DIAGNOSIS — Z87891 Personal history of nicotine dependence: Secondary | ICD-10-CM | POA: Insufficient documentation

## 2018-12-11 DIAGNOSIS — Z8639 Personal history of other endocrine, nutritional and metabolic disease: Secondary | ICD-10-CM

## 2018-12-11 DIAGNOSIS — J449 Chronic obstructive pulmonary disease, unspecified: Secondary | ICD-10-CM | POA: Insufficient documentation

## 2018-12-11 DIAGNOSIS — Z8546 Personal history of malignant neoplasm of prostate: Secondary | ICD-10-CM

## 2018-12-11 DIAGNOSIS — K219 Gastro-esophageal reflux disease without esophagitis: Secondary | ICD-10-CM | POA: Diagnosis not present

## 2018-12-11 DIAGNOSIS — Z6822 Body mass index (BMI) 22.0-22.9, adult: Secondary | ICD-10-CM | POA: Diagnosis not present

## 2018-12-11 DIAGNOSIS — E559 Vitamin D deficiency, unspecified: Secondary | ICD-10-CM | POA: Diagnosis not present

## 2018-12-11 DIAGNOSIS — E782 Mixed hyperlipidemia: Secondary | ICD-10-CM

## 2018-12-11 DIAGNOSIS — Z85828 Personal history of other malignant neoplasm of skin: Secondary | ICD-10-CM

## 2018-12-11 DIAGNOSIS — Z79899 Other long term (current) drug therapy: Secondary | ICD-10-CM | POA: Diagnosis not present

## 2018-12-11 DIAGNOSIS — J984 Other disorders of lung: Secondary | ICD-10-CM | POA: Diagnosis not present

## 2018-12-11 NOTE — Patient Instructions (Addendum)
Brian Proctor , Thank you for taking time to come for your Medicare Wellness Visit. I appreciate your ongoing commitment to your health goals. Please review the following plan we discussed and let me know if I can assist you in the future.   These are the goals we discussed: Goals    . Exercise 150 min/wk Moderate Activity     Incorporate weights/resistance exercises     . LDL CALC < 100       This is a list of the screening recommended for you and due dates:  Health Maintenance  Topic Date Due  . Flu Shot  07/27/2018  . Tetanus Vaccine  08/07/2028  . Pneumonia vaccines  Completed    Know what a healthy weight is for you (roughly BMI <25) and aim to maintain this  Aim for 7+ servings of fruits and vegetables daily  65-80+ fluid ounces of water or unsweet tea for healthy kidneys  Limit to max 1 drink of alcohol per day; avoid smoking/tobacco  Limit animal fats in diet for cholesterol and heart health - choose grass fed whenever available  Avoid highly processed foods, and foods high in saturated/trans fats  Aim for low stress - take time to unwind and care for your mental health  Aim for 150 min of moderate intensity exercise weekly for heart health, and weights twice weekly for bone health  Aim for 7-9 hours of sleep daily    Gastroesophageal Reflux Disease, Adult Normally, food travels down the esophagus and stays in the stomach to be digested. If a person has gastroesophageal reflux disease (GERD), food and stomach acid move back up into the esophagus. When this happens, the esophagus becomes sore and swollen (inflamed). Over time, GERD can make small holes (ulcers) in the lining of the esophagus. Follow these instructions at home: Diet  Follow a diet as told by your doctor. You may need to avoid foods and drinks such as: ? Coffee and tea (with or without caffeine). ? Drinks that contain alcohol. ? Energy drinks and sports drinks. ? Carbonated drinks or  sodas. ? Chocolate and cocoa. ? Peppermint and mint flavorings. ? Garlic and onions. ? Horseradish. ? Spicy and acidic foods, such as peppers, chili powder, curry powder, vinegar, hot sauces, and BBQ sauce. ? Citrus fruit juices and citrus fruits, such as oranges, lemons, and limes. ? Tomato-based foods, such as red sauce, chili, salsa, and pizza with red sauce. ? Fried and fatty foods, such as donuts, french fries, potato chips, and high-fat dressings. ? High-fat meats, such as hot dogs, rib eye steak, sausage, ham, and bacon. ? High-fat dairy items, such as whole milk, butter, and cream cheese.  Eat small meals often. Avoid eating large meals.  Avoid drinking large amounts of liquid with your meals.  Avoid eating meals during the 2-3 hours before bedtime.  Avoid lying down right after you eat.  Do not exercise right after you eat. General instructions  Pay attention to any changes in your symptoms.  Take over-the-counter and prescription medicines only as told by your doctor. Do not take aspirin, ibuprofen, or other NSAIDs unless your doctor says it is okay.  Do not use any tobacco products, including cigarettes, chewing tobacco, and e-cigarettes. If you need help quitting, ask your doctor.  Wear loose clothes. Do not wear anything tight around your waist.  Raise (elevate) the head of your bed about 6 inches (15 cm).  Try to lower your stress. If you need help doing  this, ask your doctor.  If you are overweight, lose an amount of weight that is healthy for you. Ask your doctor about a safe weight loss goal.  Keep all follow-up visits as told by your doctor. This is important. Contact a doctor if:  You have new symptoms.  You lose weight and you do not know why it is happening.  You have trouble swallowing, or it hurts to swallow.  You have wheezing or a cough that keeps happening.  Your symptoms do not get better with treatment.  You have a hoarse voice. Get help  right away if:  You have pain in your arms, neck, jaw, teeth, or back.  You feel sweaty, dizzy, or light-headed.  You have chest pain or shortness of breath.  You throw up (vomit) and your throw up looks like blood or coffee grounds.  You pass out (faint).  Your poop (stool) is bloody or black.  You cannot swallow, drink, or eat. This information is not intended to replace advice given to you by your health care provider. Make sure you discuss any questions you have with your health care provider. Document Released: 05/31/2008 Document Revised: 05/20/2016 Document Reviewed: 04/09/2015 Elsevier Interactive Patient Education  [employer].

## 2018-12-12 LAB — CBC WITH DIFFERENTIAL/PLATELET
ABSOLUTE MONOCYTES: 845 {cells}/uL (ref 200–950)
BASOS ABS: 79 {cells}/uL (ref 0–200)
BASOS PCT: 0.9 %
EOS ABS: 458 {cells}/uL (ref 15–500)
Eosinophils Relative: 5.2 %
HCT: 45.1 % (ref 38.5–50.0)
HEMOGLOBIN: 15 g/dL (ref 13.2–17.1)
Lymphs Abs: 2077 cells/uL (ref 850–3900)
MCH: 30.6 pg (ref 27.0–33.0)
MCHC: 33.3 g/dL (ref 32.0–36.0)
MCV: 92 fL (ref 80.0–100.0)
MONOS PCT: 9.6 %
MPV: 10.5 fL (ref 7.5–12.5)
NEUTROS ABS: 5342 {cells}/uL (ref 1500–7800)
Neutrophils Relative %: 60.7 %
Platelets: 263 10*3/uL (ref 140–400)
RBC: 4.9 10*6/uL (ref 4.20–5.80)
RDW: 12.3 % (ref 11.0–15.0)
Total Lymphocyte: 23.6 %
WBC: 8.8 10*3/uL (ref 3.8–10.8)

## 2018-12-12 LAB — COMPLETE METABOLIC PANEL WITH GFR
AG RATIO: 1.5 (calc) (ref 1.0–2.5)
ALBUMIN MSPROF: 4.1 g/dL (ref 3.6–5.1)
ALKALINE PHOSPHATASE (APISO): 65 U/L (ref 40–115)
ALT: 20 U/L (ref 9–46)
AST: 25 U/L (ref 10–35)
BILIRUBIN TOTAL: 0.5 mg/dL (ref 0.2–1.2)
BUN: 21 mg/dL (ref 7–25)
CHLORIDE: 105 mmol/L (ref 98–110)
CO2: 29 mmol/L (ref 20–32)
Calcium: 9.4 mg/dL (ref 8.6–10.3)
Creat: 1.08 mg/dL (ref 0.70–1.11)
GFR, Est African American: 75 mL/min/{1.73_m2} (ref >=60)
GFR, Est Non African American: 64 mL/min/{1.73_m2} (ref >=60)
GLOBULIN: 2.7 g/dL (ref 1.9–3.7)
GLUCOSE: 70 mg/dL (ref 65–99)
POTASSIUM: 4.1 mmol/L (ref 3.5–5.3)
SODIUM: 142 mmol/L (ref 135–146)
TOTAL PROTEIN: 6.8 g/dL (ref 6.1–8.1)

## 2018-12-12 LAB — LIPID PANEL
CHOLESTEROL: 159 mg/dL (ref <200)
HDL: 41 mg/dL (ref >40)
LDL CHOLESTEROL (CALC): 89 mg/dL
Non-HDL Cholesterol (Calc): 118 mg/dL (calc) (ref <130)
Total CHOL/HDL Ratio: 3.9 (calc) (ref <5.0)
Triglycerides: 193 mg/dL — ABNORMAL HIGH (ref <150)

## 2018-12-12 LAB — TSH: TSH: 3.76 m[IU]/L (ref 0.40–4.50)

## 2019-03-05 DIAGNOSIS — L821 Other seborrheic keratosis: Secondary | ICD-10-CM | POA: Diagnosis not present

## 2019-03-05 DIAGNOSIS — Z85828 Personal history of other malignant neoplasm of skin: Secondary | ICD-10-CM | POA: Diagnosis not present

## 2019-03-05 DIAGNOSIS — L570 Actinic keratosis: Secondary | ICD-10-CM | POA: Diagnosis not present

## 2019-03-05 DIAGNOSIS — D485 Neoplasm of uncertain behavior of skin: Secondary | ICD-10-CM | POA: Diagnosis not present

## 2019-03-05 DIAGNOSIS — D0461 Carcinoma in situ of skin of right upper limb, including shoulder: Secondary | ICD-10-CM | POA: Diagnosis not present

## 2019-04-11 ENCOUNTER — Ambulatory Visit (INDEPENDENT_AMBULATORY_CARE_PROVIDER_SITE_OTHER): Payer: Medicare Other | Admitting: Internal Medicine

## 2019-04-11 ENCOUNTER — Other Ambulatory Visit: Payer: Self-pay

## 2019-04-11 ENCOUNTER — Encounter: Payer: Self-pay | Admitting: Internal Medicine

## 2019-04-11 VITALS — BP 110/72 | HR 64 | Temp 97.5°F | Resp 16 | Ht 70.0 in | Wt 166.2 lb

## 2019-04-11 DIAGNOSIS — I10 Essential (primary) hypertension: Secondary | ICD-10-CM | POA: Diagnosis not present

## 2019-04-11 DIAGNOSIS — R7309 Other abnormal glucose: Secondary | ICD-10-CM

## 2019-04-11 DIAGNOSIS — Z79899 Other long term (current) drug therapy: Secondary | ICD-10-CM

## 2019-04-11 DIAGNOSIS — E782 Mixed hyperlipidemia: Secondary | ICD-10-CM | POA: Diagnosis not present

## 2019-04-11 DIAGNOSIS — E559 Vitamin D deficiency, unspecified: Secondary | ICD-10-CM

## 2019-04-11 DIAGNOSIS — R7303 Prediabetes: Secondary | ICD-10-CM | POA: Diagnosis not present

## 2019-04-11 NOTE — Progress Notes (Signed)
History of Present Illness:      This very nice 83 y.o. [name] presents for 6 month follow up with HTN, HLD, Pre-Diabetes and Vitamin D Deficiency. Patient has hx/o GERD controlled with diet. He is s/p suprapubic prostatectomy (2000) and has no known recurrence.      Patient is treated for HTN (2005) & BP has been controlled at home. Today's BP is at goal - 110/72. Patient has had no complaints of any cardiac type chest pain, palpitations, dyspnea / orthopnea / PND, dizziness, claudication, or dependent edema.      Hyperlipidemia is controlled with diet & meds. Patient denies myalgias or other med SE's. Last Lipids were at goal albeit slightly elevated Trig's: Lab Results  Component Value Date   CHOL 159 12/11/2018   HDL 41 12/11/2018   LDLCALC 89 12/11/2018   TRIG 193 (H) 12/11/2018   CHOLHDL 3.9 12/11/2018       Also, the patient has history of PreDiabetes  (A1c 5.7% / 2013)  and has had no symptoms of reactive hypoglycemia, diabetic polys, paresthesias or visual blurring.  Last A1c was Normal & at goal: Lab Results  Component Value Date   HGBA1C 5.5 10/02/2018       Further, the patient also has history of Vitamin D Deficiency ("41" / 2013)  and supplements vitamin D without any suspected side-effects. Last vitamin D was at goal: Lab Results  Component Value Date   VD25OH 27 10/02/2018   Current Outpatient Medications on File Prior to Visit  Medication Sig  . Ascorbic Acid (VITAMIN C PO) Take by mouth daily.  [name] aspirin 81 MG tablet Take 81 mg by mouth daily.  [name] atorvastatin (LIPITOR) 40 MG tablet TAKE 1 TABLET BY MOUTH DAILY  . Cholecalciferol (VITAMIN D PO) Take by mouth daily.  [name] CINNAMON PO Take 1,000 mg by mouth daily.  . Coenzyme Q10 200 MG TABS Take 200 mg by mouth daily.  . fish oil-omega-3 fatty acids 1000 MG capsule Take 2 g by mouth daily.  . Flaxseed, Linseed, (FLAXSEED OIL PO) Take 1,000 mg by mouth daily.  . hydrocortisone (PROCTO-MED HC) 2.5 % rectal cream User 3  to 4 x / day as needed  . Magnesium 250 MG TABS Take 250 mg by mouth daily.  . Multiple Vitamins-Minerals (MULTIVITAMIN PO) Take by mouth daily.  . pseudoephedrine (SUDAFED) 30 MG tablet Take 30 mg by mouth every 4 (four) hours as needed for congestion.  . vitamin C (ASCORBIC ACID) 500 MG tablet Take 500 mg by mouth daily.   No current facility-administered medications on file prior to visit.    Not on File   PMHx:   Past Medical History:  Diagnosis Date  . Cancer [facility])    prostate - basal cell  . GERD (gastroesophageal reflux disease)   . Hearing loss    wear hearing aid  . Hyperlipidemia   . Hypertension   . Inguinal hernia   . No pertinent past medical history   . Prediabetes   . Vitamin D deficiency   . Wears glasses   . Wears hearing aid    both ears   Immunization History  Administered Date(s) Administered  . DT 09/03/2015  . Influenza Inj Mdck Quad Pf 11/10/2018  . Influenza Split 10/28/2016  . Influenza, High Dose Seasonal PF 09/27/2014, 09/03/2015, 10/05/2017  . Pneumococcal Conjugate-13 10/16/2014  . Pneumococcal-Unspecified 02/03/2011  . Td 07/26/2005, 08/07/2018  . Zoster 12/27/2006   Past Surgical History:  Procedure  Laterality Date  . COLONOSCOPY     x2  . INGUINAL HERNIA REPAIR  01/15/2013   Procedure: HERNIA REPAIR INGUINAL ADULT;  Surgeon: Gwenyth Ober, MD;  Location: [facility];  Service: General;  Laterality: Right;  . [place] or 2000   due to cancer  . TONSILLECTOMY     as a child   FHx:    Reviewed / unchanged  SHx:    Reviewed / unchanged   Systems Review:  Constitutional: Denies fever, chills, wt changes, headaches, insomnia, fatigue, night sweats, change in appetite. Eyes: Denies redness, blurred vision, diplopia, discharge, itchy, watery eyes.  ENT: Denies discharge, congestion, post nasal drip, epistaxis, sore throat, earache, hearing loss, dental pain, tinnitus, vertigo, sinus pain, snoring.  CV:  Denies chest pain, palpitations, irregular heartbeat, syncope, dyspnea, diaphoresis, orthopnea, PND, claudication or edema. Respiratory: denies cough, dyspnea, DOE, pleurisy, hoarseness, laryngitis, wheezing.  Gastrointestinal: Denies dysphagia, odynophagia, heartburn, reflux, water brash, abdominal pain or cramps, nausea, vomiting, bloating, diarrhea, constipation, hematemesis, melena, hematochezia  or hemorrhoids. Genitourinary: Denies dysuria, frequency, urgency, nocturia, hesitancy, discharge, hematuria or flank pain. Musculoskeletal: Denies arthralgias, myalgias, stiffness, jt. swelling, pain, limping or strain/sprain.  Skin: Denies pruritus, rash, hives, warts, acne, eczema or change in skin lesion(s). Neuro: No weakness, tremor, incoordination, spasms, paresthesia or pain. Psychiatric: Denies confusion, memory loss or sensory loss. Endo: Denies change in weight, skin or hair change.  Heme/Lymph: No excessive bleeding, bruising or enlarged lymph nodes.  Physical Exam  BP 110/72   Pulse 64   Temp (!) 97.5 F (36.4 C)   Resp 16   Ht 5\' 10"  (1.778 m)   Wt 166 lb 3.2 oz (75.4 kg)   BMI 23.85 kg/m   Appears  well nourished, well groomed  and in no distress.  Eyes: PERRLA, EOMs, conjunctiva no swelling or erythema. Sinuses: No frontal/maxillary tenderness ENT/Mouth: EAC's clear, TM's nl w/o erythema, bulging. Nares clear w/o erythema, swelling, exudates. Oropharynx clear without erythema or exudates. Oral hygiene is good. Tongue normal, non obstructing. Hearing intact.  Neck: Supple. Thyroid not palpable. Car 2+/2+ without bruits, nodes or JVD. Chest: Respirations nl with BS clear & equal w/o rales, rhonchi, wheezing or stridor.  Cor: Heart sounds normal w/ regular rate and rhythm without sig. murmurs, gallops, clicks or rubs. Peripheral pulses normal and equal  without edema.  Abdomen: Soft & bowel sounds normal. Non-tender w/o guarding, rebound, hernias, masses or organomegaly.   Lymphatics: Unremarkable.  Musculoskeletal: Full ROM all peripheral extremities, joint stability, 5/5 strength and normal gait.  Skin: Warm, dry without exposed rashes, lesions or ecchymosis apparent.  Neuro: Cranial nerves intact, reflexes equal bilaterally. Sensory-motor testing grossly intact. Tendon reflexes grossly intact.  Pysch: Alert & oriented x 3.  Insight and judgement nl & appropriate. No ideations.  Assessment and Plan:  1. Essential hypertension  - Continue medication, monitor blood pressure at home.  - Continue DASH diet.  Reminder to go to the ER if any CP,  SOB, nausea, dizziness, severe HA, changes vision/speech.  - CBC with Differential/Platelet - COMPLETE METABOLIC PANEL WITH GFR - Magnesium - TSH  2. Hyperlipidemia, mixed  - Continue diet/meds, exercise,& lifestyle modifications.  - Continue monitor periodic cholesterol/liver & renal functions   - Lipid panel - TSH  3. Abnormal glucose  - Continue diet, exercise  - Lifestyle modifications.  - Monitor appropriate labs.  - Hemoglobin A1c - Insulin, random  4. Vitamin D deficiency  - Continue supplementation.  -  VITAMIN D 25 Hydroxyl  5. Prediabetes  - Hemoglobin A1c - Insulin, random  6. Medication management  - CBC with Differential/Platelet - COMPLETE METABOLIC PANEL WITH GFR - Magnesium - Lipid panel - TSH - Hemoglobin A1c - Insulin, random - VITAMIN D 25 Hydroxyl        Discussed  regular exercise, BP monitoring, weight control to achieve/maintain BMI less than 25 and discussed med and SE's. Recommended labs to assess and monitor clinical status with further disposition pending results of labs. I discussed the assessment and treatment plan with the patient. The patient was provided an opportunity to ask questions and all were answered. The patient agreed with the plan and demonstrated an understanding of the instructions.  Over 30 minutes of exam, counseling, chart review was  performed.  Kirtland Bouchard, MD

## 2019-04-11 NOTE — Patient Instructions (Signed)

## 2019-04-12 LAB — COMPLETE METABOLIC PANEL WITH GFR
AG Ratio: 1.7 (calc) (ref 1.0–2.5)
ALT: 26 U/L (ref 9–46)
AST: 28 U/L (ref 10–35)
Albumin: 4.3 g/dL (ref 3.6–5.1)
Alkaline phosphatase (APISO): 68 U/L (ref 35–144)
BUN/Creatinine Ratio: 16 (calc) (ref 6–22)
BUN: 19 mg/dL (ref 7–25)
CO2: 29 mmol/L (ref 20–32)
Calcium: 9.4 mg/dL (ref 8.6–10.3)
Chloride: 106 mmol/L (ref 98–110)
Creat: 1.17 mg/dL — ABNORMAL HIGH (ref 0.70–1.11)
GFR, Est African American: 67 mL/min/{1.73_m2} (ref >=60)
GFR, Est Non African American: 58 mL/min/{1.73_m2} — ABNORMAL LOW (ref >=60)
Globulin: 2.5 g/dL (calc) (ref 1.9–3.7)
Glucose, Bld: 87 mg/dL (ref 65–99)
Potassium: 4.9 mmol/L (ref 3.5–5.3)
Sodium: 142 mmol/L (ref 135–146)
Total Bilirubin: 0.6 mg/dL (ref 0.2–1.2)
Total Protein: 6.8 g/dL (ref 6.1–8.1)

## 2019-04-12 LAB — CBC WITH DIFFERENTIAL/PLATELET
Absolute Monocytes: 672 cells/uL (ref 200–950)
Basophils Absolute: 58 cells/uL (ref 0–200)
Basophils Relative: 0.8 %
Eosinophils Absolute: 299 cells/uL (ref 15–500)
Eosinophils Relative: 4.1 %
HCT: 44.8 % (ref 38.5–50.0)
Hemoglobin: 15.2 g/dL (ref 13.2–17.1)
Lymphs Abs: 1752 cells/uL (ref 850–3900)
MCH: 30.9 pg (ref 27.0–33.0)
MCHC: 33.9 g/dL (ref 32.0–36.0)
MCV: 91.1 fL (ref 80.0–100.0)
MPV: 10.4 fL (ref 7.5–12.5)
Monocytes Relative: 9.2 %
Neutro Abs: 4519 cells/uL (ref 1500–7800)
Neutrophils Relative %: 61.9 %
Platelets: 252 10*3/uL (ref 140–400)
RBC: 4.92 10*6/uL (ref 4.20–5.80)
RDW: 12.8 % (ref 11.0–15.0)
Total Lymphocyte: 24 %
WBC: 7.3 10*3/uL (ref 3.8–10.8)

## 2019-04-12 LAB — LIPID PANEL
Cholesterol: 145 mg/dL (ref <200)
HDL: 41 mg/dL (ref >=40)
LDL Cholesterol (Calc): 83 mg/dL (calc)
Non-HDL Cholesterol (Calc): 104 mg/dL (calc) (ref <130)
Total CHOL/HDL Ratio: 3.5 (calc) (ref <5.0)
Triglycerides: 115 mg/dL (ref <150)

## 2019-04-12 LAB — VITAMIN D 25 HYDROXY (VIT D DEFICIENCY, FRACTURES): Vit D, 25-Hydroxy: 57 ng/mL (ref 30–100)

## 2019-04-12 LAB — HEMOGLOBIN A1C
Hgb A1c MFr Bld: 5.5 % of total Hgb (ref <5.7)
Mean Plasma Glucose: 111 (calc)
eAG (mmol/L): 6.2 (calc)

## 2019-04-12 LAB — MAGNESIUM: Magnesium: 2.3 mg/dL (ref 1.5–2.5)

## 2019-04-12 LAB — TSH: TSH: 2.93 mIU/L (ref 0.40–4.50)

## 2019-04-12 LAB — INSULIN, RANDOM: Insulin: 9.6 u[IU]/mL

## 2019-07-02 ENCOUNTER — Other Ambulatory Visit: Payer: Self-pay | Admitting: Internal Medicine

## 2019-07-16 NOTE — Progress Notes (Signed)
FOLLOW UP  Assessment and Plan:   Memory issues 1/3, otherwise normal exam and memory test.  Get MRI, get labs Discussed aricept and namenda, declines at this time May refer to neuro but will wait until after labs.  He was informed to call 911 if he develop any new symptoms such as worsening headaches, episodes of blurred vision, double vision or complete loss of vision or speech difficulties or motor weakness.   Hypertension Well controlled  Monitor blood pressure at home; patient to call if consistently greater than 130/80 Continue DASH diet.   Reminder to go to the ER if any CP, SOB, nausea, dizziness, severe HA, changes vision/speech, left arm numbness and tingling and jaw pain.  Cholesterol Currently at goal;  Continue low cholesterol diet and exercise.  Check lipid panel.   History of elevated glucose Recent A1Cs at goal Discussed diet/exercise, weight management  Defer A1C; check BMP  BMI 23 Continue to recommend diet heavy in fruits and veggies and low in animal meats, cheeses, and dairy products, appropriate calorie intake Discuss exercise recommendations routinely Continue to monitor weight at each visit  Vitamin D Def Above goal at last visit; continue supplementation to maintain goal of 70-100 Recheck Vit D level  Continue diet and meds as discussed. Further disposition pending results of labs. Discussed med's effects and SE's.   Over 30 minutes of exam, counseling, chart review, and critical decision making was performed.   Future Appointments  Date Time Provider [place]  10/24/2019 10:00 AM Unk Pinto, MD [facility] None  12/18/2019 10:00 AM Liane Comber, NP [facility] None    ----------------------------------------------------------------------------------------------------------------------  HPI 83 y.o. male  presents for 3 month follow up on hypertension, cholesterol, glucose management, weight and vitamin D deficiency.   He  complains of memory changes, states worse over last several months. He states for instance today while driving over here, he did not get lost but he had to think very hard about directions coming here. When talking about examples he has trouble word finding, he states his wife does finances x forever, she does his medications. He has not missed any appointments, he will write them down.  He is hard of hearing, has bilateral hearing aids.  History of prostate cancer s/p prostatectomy 1999.  He denies depression however his son is at [facility], has not been able to see him for 3 months but just got to see him last week.   No results found for: VITAMINB12  Denies tremors, dizziness, changes in vision, numbness tingling, gait is normal, tinnitus, joint pain/swelling, rash.   BMI is Body mass index is 23.02 kg/m., he has been working on diet and exercise, golfs or walks 3 miles multiple times every week. States he is not trying to lose weight, he is active.  CXR 11/2018- former smoker.  Colonoscopy 2015 Wt Readings from Last 3 Encounters:  07/18/19 160 lb 6.4 oz (72.8 kg)  04/11/19 166 lb 3.2 oz (75.4 kg)  12/11/18 168 lb 9.6 oz (76.5 kg)   His blood pressure has been controlled at home, today their BP is BP: 122/80  He does workout. He denies chest pain, shortness of breath, dizziness.   He is on cholesterol medication (atorvastatin 20 mg daily) and denies myalgias. His cholesterol is at goal. The cholesterol last visit was:   Lab Results  Component Value Date   CHOL 145 04/11/2019   HDL 41 04/11/2019   LDLCALC 83 04/11/2019   TRIG 115 04/11/2019  CHOLHDL 3.5 04/11/2019    He has been working on diet and exercise for glucose management, and denies foot ulcerations, increased appetite, nausea, paresthesia of the feet, polydipsia, polyuria, visual disturbances, vomiting and weight loss. Last A1C in the office was:  Lab Results  Component Value Date   HGBA1C 5.5  04/11/2019   Patient is on Vitamin D supplement.   Lab Results  Component Value Date   VD25OH 57 04/11/2019        Current Medications:  Current Outpatient Medications on File Prior to Visit  Medication Sig  . Ascorbic Acid (VITAMIN C PO) Take by mouth daily.  [name] aspirin 81 MG tablet Take 81 mg by mouth daily.  [name] atorvastatin (LIPITOR) 40 MG tablet TAKE 1 TABLET BY MOUTH DAILY  . Cholecalciferol (VITAMIN D PO) Take by mouth daily.  [name] CINNAMON PO Take 1,000 mg by mouth daily.  . Coenzyme Q10 200 MG TABS Take 200 mg by mouth daily.  . fish oil-omega-3 fatty acids 1000 MG capsule Take 2 g by mouth daily.  . Flaxseed, Linseed, (FLAXSEED OIL PO) Take 1,000 mg by mouth daily.  . hydrocortisone (PROCTO-MED HC) 2.5 % rectal cream User 3 to 4 x / day as needed  . Magnesium 250 MG TABS Take 250 mg by mouth daily.  . Multiple Vitamins-Minerals (MULTIVITAMIN PO) Take by mouth daily.  . pseudoephedrine (SUDAFED) 30 MG tablet Take 30 mg by mouth every 4 (four) hours as needed for congestion.  . vitamin C (ASCORBIC ACID) 500 MG tablet Take 500 mg by mouth daily.   No current facility-administered medications on file prior to visit.      Allergies: Not on File   Medical History:  Past Medical History:  Diagnosis Date  . Cancer [facility])    prostate - basal cell  . GERD (gastroesophageal reflux disease)   . Hearing loss    wear hearing aid  . Hyperlipidemia   . Hypertension   . Inguinal hernia   . No pertinent past medical history   . Prediabetes   . Vitamin D deficiency   . Wears glasses   . Wears hearing aid    both ears   Family history- Reviewed and unchanged Social history- Reviewed and unchanged   Review of Systems:  Review of Systems  Constitutional: Negative for malaise/fatigue and weight loss.  HENT: Negative for hearing loss and tinnitus.   Eyes: Negative for blurred vision and double vision.  Respiratory: Negative for cough, shortness of breath and wheezing.    Cardiovascular: Negative for chest pain, palpitations, orthopnea, claudication and leg swelling.  Gastrointestinal: Negative for abdominal pain, blood in stool, constipation, diarrhea, heartburn, melena, nausea and vomiting.  Genitourinary: Negative.   Musculoskeletal: Negative for joint pain and myalgias.  Skin: Negative for rash.  Neurological: Negative for dizziness, tingling, sensory change, weakness and headaches.  Endo/Heme/Allergies: Negative for polydipsia.  Psychiatric/Behavioral: Negative.   All other systems reviewed and are negative.     Physical Exam: BP 122/80   Pulse 61   Temp (!) 97.3 F (36.3 C)   Ht 5\' 10"  (1.778 m)   Wt 160 lb 6.4 oz (72.8 kg)   SpO2 98%   BMI 23.02 kg/m  Wt Readings from Last 3 Encounters:  07/18/19 160 lb 6.4 oz (72.8 kg)  04/11/19 166 lb 3.2 oz (75.4 kg)  12/11/18 168 lb 9.6 oz (76.5 kg)   General Appearance: Well nourished, in no apparent distress. Eyes: PERRLA, EOMs, conjunctiva no swelling or erythema  Sinuses: No Frontal/maxillary tenderness ENT/Mouth: Ext aud canals clear, TMs without erythema, bulging. No erythema, swelling, or exudate on post pharynx.  Tonsils not swollen or erythematous. Hearing normal.  Neck: Supple, thyroid normal.  Respiratory: Respiratory effort normal, BS equal bilaterally without rales, rhonchi, wheezing or stridor.  Cardio: RRR with no MRGs. Brisk peripheral pulses without edema.  Abdomen: Soft, + BS.  Non tender, no guarding, rebound, hernias, masses. Lymphatics: Non tender without lymphadenopathy.  Musculoskeletal: Full ROM, 5/5 strength, Normal gait Skin: Warm, dry without rashes, lesions, ecchymosis.  Neuro: Cranial nerves intact. No cerebellar symptoms.  Psych: Awake and oriented X 3, normal affect, Insight and Judgment appropriate.    Vicie Mutters, PA-C 11:00 AM [facility]

## 2019-07-18 ENCOUNTER — Other Ambulatory Visit: Payer: Self-pay

## 2019-07-18 ENCOUNTER — Ambulatory Visit (INDEPENDENT_AMBULATORY_CARE_PROVIDER_SITE_OTHER): Payer: Medicare Other | Admitting: Physician Assistant

## 2019-07-18 ENCOUNTER — Encounter: Payer: Self-pay | Admitting: Physician Assistant

## 2019-07-18 VITALS — BP 122/80 | HR 61 | Temp 97.3°F | Ht 70.0 in | Wt 160.4 lb

## 2019-07-18 DIAGNOSIS — R7309 Other abnormal glucose: Secondary | ICD-10-CM

## 2019-07-18 DIAGNOSIS — R29818 Other symptoms and signs involving the nervous system: Secondary | ICD-10-CM

## 2019-07-18 DIAGNOSIS — R413 Other amnesia: Secondary | ICD-10-CM | POA: Diagnosis not present

## 2019-07-18 DIAGNOSIS — E538 Deficiency of other specified B group vitamins: Secondary | ICD-10-CM | POA: Diagnosis not present

## 2019-07-18 DIAGNOSIS — I10 Essential (primary) hypertension: Secondary | ICD-10-CM | POA: Diagnosis not present

## 2019-07-18 DIAGNOSIS — E611 Iron deficiency: Secondary | ICD-10-CM | POA: Diagnosis not present

## 2019-07-18 DIAGNOSIS — Z79899 Other long term (current) drug therapy: Secondary | ICD-10-CM

## 2019-07-18 DIAGNOSIS — E782 Mixed hyperlipidemia: Secondary | ICD-10-CM | POA: Diagnosis not present

## 2019-07-18 DIAGNOSIS — E559 Vitamin D deficiency, unspecified: Secondary | ICD-10-CM | POA: Diagnosis not present

## 2019-07-18 DIAGNOSIS — J449 Chronic obstructive pulmonary disease, unspecified: Secondary | ICD-10-CM

## 2019-07-18 NOTE — Patient Instructions (Signed)
He was informed to call 911 if he develop any new symptoms such as worsening headaches, episodes of blurred vision, double vision or complete loss of vision or speech difficulties or motor weakness.  Will set up for an MRI for you.    Vascular Dementia Dementia is a condition in which a person has problems with thinking, memory, and behavior that are severe enough to interfere with daily life. Vascular dementia is a type of dementia. It results from brain damage that is caused by the brain not getting enough blood. This condition may also be called vascular cognitive impairment. What are the causes? Vascular dementia is caused by conditions that lessen blood flow to the brain. Common causes of this condition include:  Multiple small strokes. These may happen without symptoms (silent stroke).  Major stroke.  Damage to small blood vessels in the brain (cerebral small vessel disease). What increases the risk? The following factors may make you more likely to develop this condition:  Having had a stroke.  Having high blood pressure (hypertension) or high cholesterol.  Having a disease that affects the heart or blood vessels.  Smoking.  Having diabetes.  Having metabolic syndrome.  Being obese.  Not being active.  Having depression.  Being over age 68. What are the signs or symptoms? Symptoms can vary from one person to another. Symptoms may be mild or severe depending on the amount of damage and which parts of the brain have been affected. Symptoms may begin suddenly or may develop slowly. Mental symptoms of vascular dementia may include:  Confusion.  Memory problems.  Poor attention and concentration.  Trouble understanding speech.  Depression.  Personality changes.  Trouble recognizing familiar people.  Agitation or aggression.  Paranoia.  Delusions or hallucinations. Physical symptoms of vascular dementia may include:  Weakness.  Poor balance.  Loss of  bladder or bowel control (incontinence).  Unsteady walking (gait).  Speaking problems. Behavioral symptoms of vascular dementia may include:  Getting lost in familiar places.  Problems with planning and judgment.  Trouble following instructions.  Social problems.  Emotional outbursts.  Trouble with daily activities and self-care.  Problems handling money. Symptoms may remain stable, or they may get worse over time. Symptoms of vascular dementia may be similar to those of Alzheimer's disease. The two conditions can occur together (mixed dementia). How is this diagnosed? Your health care provider will consider your medical history and symptoms or changes that are reported by friends and family. Your health care provider will do a physical exam and may order lab tests or other tests that check brain and nervous system function. Tests that may be done include:  Blood tests.  Brain imaging tests.  Tests of movement, speech, and other daily activities (neurological exam).  Tests of memory, thinking, and problem-solving (neuropsychological or neurocognitive testing). There is not a specific test to diagnose vascular dementia. Diagnosis may involve several specialists. These may include:  A health care provider who specializes in the brain and nervous system (neurologist).  A health provider who specializes in understanding how problems in the brain can alter behavior and cognitive function (neuropsychologist). How is this treated? There is no cure for vascular dementia. Brain damage that has already occurred cannot be reversed. Treatment depends on:  How severe the condition is.  Which parts of your brain have been affected.  Your overall health. Treatment measures aim to:  Treat the underlying cause of vascular dementia and manage risk factors. This may include: ? Controlling blood pressure. ?  Lowering cholesterol. ? Treating diabetes. ? Quitting smoking. ? Losing weight  or maintaining a healthy weight. ? Eating a healthy, balanced diet. ? Getting regular exercise.  Manage symptoms.  Prevent further brain damage.  Improve the person's health and quality of life. Treatment for dementia may involve a team of health care providers, including:  A neurologist.  A provider who specializes in disorders of the mind (psychiatrist).  A provider who specializes in helping people learn daily living skills (occupational therapist).  A provider who focuses on speech and language changes (Electrical engineer).  A heart specialist (cardiologist).  A provider who helps people learn how to manage physical changes, such as movement and walking (exercise physiologist or physical therapist). Follow these instructions at home: Lifestyle  People with vascular dementia may need regular help at home or daily care from a family member or home health care worker. Home care for a person with vascular dementia depends on what caused the condition and how severe the symptoms are. General guidelines for caregivers include:  Help the person with dementia remember people, appointments, and daily activities.  Help the person with dementia manage his or her medicines.  Help family and friends learn about ways to communicate with the person with dementia.  Create a safe living space to reduce the risk of injury or falls.  Find a support group to help caregivers and family cope with the effects of dementia.  General instructions  Help the person take over-the-counter and prescription medicines only as told by the health care provider.  Follow the health care provider's instructions for treating the condition that caused the dementia.  Make sure the person keeps all follow-up visits as told by the health care provider. This is important. Contact a health care provider if:  A fever develops.  New behavioral problems develop.  Problems with swallowing develop.  Confusion  gets worse.  Sleepiness gets worse. Get help right away if:  Loss of consciousness occurs.  There is a sudden loss of speech, balance, or thinking ability.  New numbness or paralysis occurs.  Sudden, severe headache occurs.  Vision is lost or suddenly gets worse in one or both eyes. Summary  Vascular dementia is a type of dementia. It results from brain damage that is caused by the brain not getting enough blood.  Vascular dementia is caused by conditions that lessen blood flow to the brain. Common causes of this condition include stroke and damage to small blood vessels in the brain.  Treatment focuses on treating the underlying cause of vascular dementia and managing any risk factors.  People with vascular dementia may need regular help at home or daily care from a family member or home health care worker.  Contact a health care provider if you or your caregiver notice any new symptoms. This information is not intended to replace advice given to you by your health care provider. Make sure you discuss any questions you have with your health care provider. Document Released: 12/03/2002 Document Revised: 09/13/2018 Document Reviewed: 09/14/2018 Elsevier Patient Education  2020 [employer].

## 2019-07-19 LAB — IRON, TOTAL/TOTAL IRON BINDING CAP
%SAT: 26 % (calc) (ref 20–48)
Iron: 85 ug/dL (ref 50–180)
TIBC: 323 mcg/dL (calc) (ref 250–425)

## 2019-07-19 LAB — TSH: TSH: 3.66 mIU/L (ref 0.40–4.50)

## 2019-07-19 LAB — MAGNESIUM: Magnesium: 2.3 mg/dL (ref 1.5–2.5)

## 2019-07-19 LAB — CBC WITH DIFFERENTIAL/PLATELET
Absolute Monocytes: 765 cells/uL (ref 200–950)
Basophils Absolute: 72 cells/uL (ref 0–200)
Basophils Relative: 0.8 %
Eosinophils Absolute: 405 cells/uL (ref 15–500)
Eosinophils Relative: 4.5 %
HCT: 47.4 % (ref 38.5–50.0)
Hemoglobin: 15.8 g/dL (ref 13.2–17.1)
Lymphs Abs: 1746 cells/uL (ref 850–3900)
MCH: 30.7 pg (ref 27.0–33.0)
MCHC: 33.3 g/dL (ref 32.0–36.0)
MCV: 92.2 fL (ref 80.0–100.0)
MPV: 10.8 fL (ref 7.5–12.5)
Monocytes Relative: 8.5 %
Neutro Abs: 6012 cells/uL (ref 1500–7800)
Neutrophils Relative %: 66.8 %
Platelets: 267 10*3/uL (ref 140–400)
RBC: 5.14 10*6/uL (ref 4.20–5.80)
RDW: 12.5 % (ref 11.0–15.0)
Total Lymphocyte: 19.4 %
WBC: 9 10*3/uL (ref 3.8–10.8)

## 2019-07-19 LAB — COMPLETE METABOLIC PANEL WITH GFR
AG Ratio: 1.7 (calc) (ref 1.0–2.5)
ALT: 23 U/L (ref 9–46)
AST: 24 U/L (ref 10–35)
Albumin: 4.3 g/dL (ref 3.6–5.1)
Alkaline phosphatase (APISO): 71 U/L (ref 35–144)
BUN/Creatinine Ratio: 18 (calc) (ref 6–22)
BUN: 21 mg/dL (ref 7–25)
CO2: 29 mmol/L (ref 20–32)
Calcium: 9.9 mg/dL (ref 8.6–10.3)
Chloride: 104 mmol/L (ref 98–110)
Creat: 1.14 mg/dL — ABNORMAL HIGH (ref 0.70–1.11)
GFR, Est African American: 70 mL/min/{1.73_m2} (ref >=60)
GFR, Est Non African American: 60 mL/min/{1.73_m2} (ref >=60)
Globulin: 2.6 g/dL (calc) (ref 1.9–3.7)
Glucose, Bld: 101 mg/dL — ABNORMAL HIGH (ref 65–99)
Potassium: 5.4 mmol/L — ABNORMAL HIGH (ref 3.5–5.3)
Sodium: 143 mmol/L (ref 135–146)
Total Bilirubin: 0.5 mg/dL (ref 0.2–1.2)
Total Protein: 6.9 g/dL (ref 6.1–8.1)

## 2019-07-19 LAB — LIPID PANEL
Cholesterol: 147 mg/dL (ref <200)
HDL: 42 mg/dL (ref >=40)
LDL Cholesterol (Calc): 80 mg/dL (calc)
Non-HDL Cholesterol (Calc): 105 mg/dL (calc) (ref <130)
Total CHOL/HDL Ratio: 3.5 (calc) (ref <5.0)
Triglycerides: 146 mg/dL (ref <150)

## 2019-07-19 LAB — VITAMIN B12: Vitamin B-12: 812 pg/mL (ref 200–1100)

## 2019-07-19 LAB — FERRITIN: Ferritin: 103 ng/mL (ref 24–380)

## 2019-08-06 ENCOUNTER — Encounter: Payer: Self-pay | Admitting: Adult Health

## 2019-08-06 ENCOUNTER — Other Ambulatory Visit: Payer: Self-pay

## 2019-08-06 ENCOUNTER — Ambulatory Visit (INDEPENDENT_AMBULATORY_CARE_PROVIDER_SITE_OTHER): Payer: Medicare Other | Admitting: Adult Health

## 2019-08-06 VITALS — BP 108/68 | HR 76 | Temp 97.5°F | Ht 70.0 in | Wt 161.0 lb

## 2019-08-06 DIAGNOSIS — L259 Unspecified contact dermatitis, unspecified cause: Secondary | ICD-10-CM | POA: Diagnosis not present

## 2019-08-06 MED ORDER — TRIAMCINOLONE ACETONIDE 0.1 % EX OINT: 1.0000 "application " | TOPICAL_OINTMENT | Freq: Two times a day (BID) | CUTANEOUS | 1 refills | Status: DC

## 2019-08-06 NOTE — Progress Notes (Signed)
Assessment and Plan:  Brian Proctor was seen today for rash.  Diagnoses and all orders for this visit:  Contact dermatitis, unspecified contact dermatitis type, unspecified trigger Suggestive of poison sumac or other contact dermatitis; OTC cortisone does appear to help with sx  Can use benadryl, calamine lotion, contact precautions discussed.  Start triamcinolone BID, wash all clothing/bedding, discussed protective clothing while gardening; anticipate improvement over 7-10 days; call if new lesions not localized to LUE or with new concerning sx, or not improving -     triamcinolone ointment (KENALOG) 0.1 %; Apply 1 application topically 2 (two) times daily.  Further disposition pending results of labs. Discussed med's effects and SE's.   Over 15 minutes of exam, counseling, chart review, and critical decision making was performed.   Future Appointments  Date Time Provider [place]  08/06/2019  2:45 PM Liane Comber, NP [facility] None  08/11/2019  5:40 PM [facility] MR 3 [facility] [address]  10/24/2019 10:00 AM Unk Pinto, MD [facility] None  12/18/2019 10:00 AM Liane Comber, NP [facility] None    ------------------------------------------------------------------------------------------------------------------   HPI BP 108/68   Pulse 76   Temp (!) 97.5 F (36.4 C)   Ht 5\' 10"  (1.778 m)   Wt 161 lb (73 kg)   SpO2 94%   BMI 23.10 kg/m   83 y.o.male presents for evaluation of mildly pruritic rash of R upper extremity x 3 days; he does report working in yard daily but not sure if he has been exposed to poison ivy or other specific agent. Seemed to be spreading initially, but reports no other rash to torso or other extremities. Denies fever/chills, URI or GI sx, HA, fever/chills. Denies burning/tingling/pain or prodrome prior to onset.   Has tried OTC hydrocortisone cream, has helped with mild itching.  Past Medical History:  Diagnosis Date  . Cancer [facility])     prostate - basal cell  . GERD (gastroesophageal reflux disease)   . Hearing loss    wear hearing aid  . Hyperlipidemia   . Hypertension   . Inguinal hernia   . No pertinent past medical history   . Prediabetes   . Vitamin D deficiency   . Wears glasses   . Wears hearing aid    both ears     Not on File  Current Outpatient Medications on File Prior to Visit  Medication Sig  . Ascorbic Acid (VITAMIN C PO) Take by mouth daily.  [name] aspirin 81 MG tablet Take 81 mg by mouth daily.  [name] atorvastatin (LIPITOR) 40 MG tablet TAKE 1 TABLET BY MOUTH DAILY  . Cholecalciferol (VITAMIN D PO) Take by mouth daily.  [name] CINNAMON PO Take 1,000 mg by mouth daily.  . Coenzyme Q10 200 MG TABS Take 200 mg by mouth daily.  . fish oil-omega-3 fatty acids 1000 MG capsule Take 2 g by mouth daily.  . Flaxseed, Linseed, (FLAXSEED OIL PO) Take 1,000 mg by mouth daily.  . hydrocortisone (PROCTO-MED HC) 2.5 % rectal cream User 3 to 4 x / day as needed  . Magnesium 250 MG TABS Take 250 mg by mouth daily.  . Multiple Vitamins-Minerals (MULTIVITAMIN PO) Take by mouth daily.  . pseudoephedrine (SUDAFED) 30 MG tablet Take 30 mg by mouth every 4 (four) hours as needed for congestion.  . vitamin C (ASCORBIC ACID) 500 MG tablet Take 500 mg by mouth daily.   No current facility-administered medications on file prior to visit.     ROS: all negative except above.  Physical Exam:  BP 108/68   Pulse 76   Temp (!) 97.5 F (36.4 C)   Ht 5\' 10"  (1.778 m)   Wt 161 lb (73 kg)   SpO2 94%   BMI 23.10 kg/m   General Appearance: Well nourished, in no apparent distress. Eyes: PERRLA, conjunctiva no swelling or erythema ENT/Mouth: No erythema, swelling, or exudate on post pharynx.  Tonsils not swollen or erythematous. Hearing normal.  Neck: Supple Respiratory: Respiratory effort normal, BS equal bilaterally without rales, rhonchi, wheezing or stridor.  Cardio: RRR with no MRGs. Brisk peripheral pulses without edema.   Abdomen: Soft, + BS.  Non tender. Lymphatics: Non tender without lymphadenopathy.  Musculoskeletal: No obvious deformity of effusion, normal gait.  Skin: Warm, dry; he has scattered rash to right upper extremity; dorsal forearm and upper inner arm with erythematous base, some vesicles, some presenting in linear pattern with excoriation marks  Neuro: Normal muscle tone, no cerebellar symptoms. Sensation intact.  Psych: Awake and oriented X 3, normal affect, Insight and Judgment appropriate.     Izora Ribas, NP 2:19 PM [facility]

## 2019-08-06 NOTE — Patient Instructions (Signed)
Contact Dermatitis Dermatitis is redness, soreness, and swelling (inflammation) of the skin. Contact dermatitis is a reaction to certain substances that touch the skin. Many different substances can cause contact dermatitis. There are two types of contact dermatitis:  Irritant contact dermatitis. This type is caused by something that irritates your skin, such as having dry hands from washing them too often with soap. This type does not require previous exposure to the substance for a reaction to occur. This is the most common type.  Allergic contact dermatitis. This type is caused by a substance that you are allergic to, such as poison ivy. This type occurs when you have been exposed to the substance (allergen) and develop a sensitivity to it. Dermatitis may develop soon after your first exposure to the allergen, or it may not develop until the next time you are exposed and every time thereafter. What are the causes? Irritant contact dermatitis is most commonly caused by exposure to:  Makeup.  Soaps.  Detergents.  Bleaches.  Acids.  Metal salts, such as nickel. Allergic contact dermatitis is most commonly caused by exposure to:  Poisonous plants.  Chemicals.  Jewelry.  Latex.  Medicines.  Preservatives in products, such as clothing. What increases the risk? You are more likely to develop this condition if you have:  A job that exposes you to irritants or allergens.  Certain medical conditions, such as asthma or eczema. What are the signs or symptoms? Symptoms of this condition may occur on your body anywhere the irritant has touched you or is touched by you.  Symptoms include: ? Dryness or flaking. ? Redness. ? Cracks. ? Itching. ? Pain or a burning feeling. ? Blisters. ? Drainage of small amounts of blood or clear fluid from skin cracks. With allergic contact dermatitis, there may also be swelling in areas such as the eyelids, mouth, or genitals. How is this  diagnosed? This condition is diagnosed with a medical history and physical exam.  A patch skin test may be performed to help determine the cause.  If the condition is related to your job, you may need to see an occupational medicine specialist. How is this treated? This condition is treated by checking for the cause of the reaction and protecting your skin from further contact. Treatment may also include:  Steroid creams or ointments. Oral steroid medicines may be needed in more severe cases.  Antibiotic medicines or antibacterial ointments, if a skin infection is present.  Antihistamine lotion or an antihistamine taken by mouth to ease itching.  A bandage (dressing). Follow these instructions at home: Skin care  Moisturize your skin as needed.  Apply cool compresses to the affected areas.  Try applying baking soda paste to your skin. Stir water into baking soda until it reaches a paste-like consistency.  Do not scratch your skin, and avoid friction to the affected area.  Avoid the use of soaps, perfumes, and dyes. Medicines  Take or apply over-the-counter and prescription medicines only as told by your health care provider.  If you were prescribed an antibiotic medicine, take or apply the antibiotic as told by your health care provider. Do not stop using the antibiotic even if your condition improves. Bathing  Try taking a bath with: ? Epsom salts. Follow the instructions on the packaging. You can get these at your local pharmacy or grocery store. ? Baking soda. Pour a small amount into the bath as directed by your health care provider. ? Colloidal oatmeal. Follow the instructions on the  packaging. You can get this at your local pharmacy or grocery store.  Bathe less frequently, such as every other day.  Bathe in lukewarm water. Avoid using hot water. Bandage care  If you were given a bandage (dressing), change it as told by your health care provider.  Wash your hands  with soap and water before and after you change your dressing. If soap and water are not available, use hand sanitizer. General instructions  Avoid the substance that caused your reaction. If you do not know what caused it, keep a journal to try to track what caused it. Write down: ? What you eat. ? What cosmetic products you use. ? What you drink. ? What you wear in the affected area. This includes jewelry.  More redness, swelling, or pain.  More fluid or blood.  Warmth.  Pus or a bad smell.  Keep all follow-up visits as told by your health care provider. This is important. Contact a health care provider if:  Your condition does not improve with treatment.  Your condition gets worse.  You have signs of infection such as swelling, tenderness, redness, soreness, or warmth in the affected area.  You have a fever.  You have new symptoms. Get help right away if:  You have a severe headache, neck pain, or neck stiffness.  You vomit.  You feel very sleepy.  You notice red streaks coming from the affected area.  Your bone or joint underneath the affected area becomes painful after the skin has healed.  The affected area turns darker.  You have difficulty breathing. Summary  Dermatitis is redness, soreness, and swelling (inflammation) of the skin. Contact dermatitis is a reaction to certain substances that touch the skin.  Symptoms of this condition may occur on your body anywhere the irritant has touched you or is touched by you.  This condition is treated by figuring out what caused the reaction and protecting your skin from further contact. Treatment may also include medicines and skin care.  Avoid the substance that caused your reaction. If you do not know what caused it, keep a journal to try to track what caused it.  Contact a health care provider if your condition gets worse or you have signs of infection such as swelling, tenderness, redness, soreness, or warmth  in the affected area. This information is not intended to replace advice given to you by your health care provider. Make sure you discuss any questions you have with your health care provider. Document Released: 12/10/2000 Document Revised: 04/04/2019 Document Reviewed: 06/28/2018 Elsevier Patient Education  [employer].    Triamcinolone skin cream, ointment, lotion, or aerosol What is this medicine? TRIAMCINOLONE (trye am SIN oh lone) is a corticosteroid. It is used on the skin to reduce swelling, redness, itching, and allergic reactions. This medicine may be used for other purposes; ask your health care provider or pharmacist if you have questions. COMMON BRAND NAME(S): Aristocort, Aristocort A, Aristocort HP, Cinalog, Cinolar, DERMASORB TA Complete, Flutex, Kenalog, Pediaderm TA, SP Rx 228, Triacet, Trianex, Triderm What should I tell my health care provider before I take this medicine? They need to know if you have any of these conditions:  any active infection  large areas of burned or damaged skin  skin wasting or thinning  an unusual or allergic reaction to triamcinolone, corticosteroids, other medicines, foods, dyes, or preservatives  pregnant or trying to get pregnant  breast-feeding How should I use this medicine? This medicine is for external  use only. Do not take by mouth. Follow the directions on the prescription label. Wash your hands before and after use. Apply a thin film of medicine to the affected area. Do not cover with a bandage or dressing unless your doctor or health care professional tells you to. Do not use on healthy skin or over large areas of skin. Do not get this medicine in your eyes. If you do, rinse out with plenty of cool tap water. It is important not to use more medicine than prescribed. Do not use your medicine more often than directed. Talk to your pediatrician regarding the use of this medicine in children. Special care may be needed. Elderly  patients are more likely to have damaged skin through aging, and this may increase side effects. This medicine should only be used for brief periods and infrequently in older patients. Overdosage: If you think you have taken too much of this medicine contact a poison control center or emergency room at once. NOTE: This medicine is only for you. Do not share this medicine with others. What if I miss a dose? If you miss a dose, use it as soon as you can. If it is almost time for your next dose, use only that dose. Do not use double or extra doses. What may interact with this medicine? Interactions are not expected. This list may not describe all possible interactions. Give your health care provider a list of all the medicines, herbs, non-prescription drugs, or dietary supplements you use. Also tell them if you smoke, drink alcohol, or use illegal drugs. Some items may interact with your medicine. What should I watch for while using this medicine? Tell your doctor or health care professional if your symptoms do not start to get better within one week. Do not use for more than 14 days. Do not use on healthy skin or over large areas of skin. Tell your doctor or health care professional if you are exposed to anyone with measles or chickenpox, or if you develop sores or blisters that do not heal properly. Do not use an airtight bandage to cover the affected area unless your doctor or health care professional tells you to. If you are to cover the area, follow the instructions carefully. Covering the area where the medicine is applied can increase the amount that passes through the skin and increases the risk of side effects. If treating the diaper area of a child, avoid covering the treated area with tight-fitting diapers or plastic pants. This may increase the amount of medicine that passes through the skin and increase the risk of serious side effects. What side effects may I notice from receiving this  medicine? Side effects that you should report to your doctor or health care professional as soon as possible:  burning or itching of the skin  dark red spots on the skin  infection  painful, red, pus filled blisters in hair follicles  thinning of the skin, sunburn more likely especially on the face Side effects that usually do not require medical attention (report to your doctor or health care professional if they continue or are bothersome):  dry skin, irritation  unusual increased growth of hair on the face or body This list may not describe all possible side effects. Call your doctor for medical advice about side effects. You may report side effects to FDA at 1-800-FDA-1088. Where should I keep my medicine? Keep out of the reach of children. Store at room temperature between 15 and  30 degrees C (59 and 86 degrees F). Do not freeze. Throw away any unused medicine after the expiration date. NOTE: This sheet is a summary. It may not cover all possible information. If you have questions about this medicine, talk to your doctor, pharmacist, or health care provider.  2020 Elsevier/Gold Standard (2018-09-14 10:50:30)

## 2019-08-11 ENCOUNTER — Ambulatory Visit
Admission: RE | Admit: 2019-08-11 | Discharge: 2019-08-11 | Disposition: A | Discharge status: Home / Self Care | Payer: Medicare Other | Source: Ambulatory Visit | Attending: Physician Assistant | Admitting: Physician Assistant

## 2019-08-11 ENCOUNTER — Other Ambulatory Visit: Payer: Self-pay

## 2019-08-11 DIAGNOSIS — R413 Other amnesia: Secondary | ICD-10-CM

## 2019-08-11 DIAGNOSIS — R29818 Other symptoms and signs involving the nervous system: Secondary | ICD-10-CM

## 2019-08-14 DIAGNOSIS — H2513 Age-related nuclear cataract, bilateral: Secondary | ICD-10-CM | POA: Diagnosis not present

## 2019-09-10 DIAGNOSIS — L84 Corns and callosities: Secondary | ICD-10-CM | POA: Diagnosis not present

## 2019-09-10 DIAGNOSIS — L821 Other seborrheic keratosis: Secondary | ICD-10-CM | POA: Diagnosis not present

## 2019-09-10 DIAGNOSIS — Z85828 Personal history of other malignant neoplasm of skin: Secondary | ICD-10-CM | POA: Diagnosis not present

## 2019-09-10 DIAGNOSIS — D485 Neoplasm of uncertain behavior of skin: Secondary | ICD-10-CM | POA: Diagnosis not present

## 2019-09-10 DIAGNOSIS — L570 Actinic keratosis: Secondary | ICD-10-CM | POA: Diagnosis not present

## 2019-09-10 DIAGNOSIS — L820 Inflamed seborrheic keratosis: Secondary | ICD-10-CM | POA: Diagnosis not present

## 2019-09-17 DIAGNOSIS — L570 Actinic keratosis: Secondary | ICD-10-CM | POA: Diagnosis not present

## 2019-09-17 DIAGNOSIS — Z85828 Personal history of other malignant neoplasm of skin: Secondary | ICD-10-CM | POA: Diagnosis not present

## 2019-09-17 DIAGNOSIS — C44329 Squamous cell carcinoma of skin of other parts of face: Secondary | ICD-10-CM | POA: Diagnosis not present

## 2019-09-17 DIAGNOSIS — D485 Neoplasm of uncertain behavior of skin: Secondary | ICD-10-CM | POA: Diagnosis not present

## 2019-09-17 DIAGNOSIS — C44229 Squamous cell carcinoma of skin of left ear and external auricular canal: Secondary | ICD-10-CM | POA: Diagnosis not present

## 2019-10-13 DIAGNOSIS — Z23 Encounter for immunization: Secondary | ICD-10-CM | POA: Diagnosis not present

## 2019-10-23 NOTE — Progress Notes (Signed)
Comprehensive Evaluation & Examination     This very nice 83 y.o. [name] presents for a comprehensive evaluation and management of multiple medical co-morbidities.  Patient has been followed for HTN, HLD, Prediabetes and Vitamin D Deficiency. Patient is s/p Suprapubic Prostatectomy in 2000 by Dr Risa Grill.     Patient has been followed for labile HTN since 2005. Patient's BP has been controlled at home.  Today's BP is at goal - 108/64. Patient denies any cardiac symptoms as chest pain, palpitations, shortness of breath, dizziness or ankle swelling.     Patient's hyperlipidemia is controlled with diet and medications. Patient denies myalgias or other medication SE's. Last lipids were at goal:  Lab Results  Component Value Date   CHOL 147 07/18/2019   HDL 42 07/18/2019   LDLCALC 80 07/18/2019   TRIG 146 07/18/2019   CHOLHDL 3.5 07/18/2019      Patient has hx/o prediabetes (A1c 5.7% / 2013 & 5.3% / 2014)  and patient denies reactive hypoglycemic symptoms, visual blurring, diabetic polys or paresthesias. Last A1c was Normal & at goal:  Lab Results  Component Value Date   HGBA1C 5.5 04/11/2019       Finally, patient has history of Vitamin D Deficiency ("41" / 2013)  and last vitamin D was near goal (70-100):  Lab Results  Component Value Date   VD25OH 57 04/11/2019   Current Outpatient Medications on File Prior to Visit  Medication Sig  . Ascorbic Acid (VITAMIN C PO) Take by mouth daily.  [name] aspirin 81 MG tablet Take 81 mg by mouth daily.  [name] atorvastatin (LIPITOR) 40 MG tablet TAKE 1 TABLET BY MOUTH DAILY  . Cholecalciferol (VITAMIN D PO) Take by mouth daily.  [name] CINNAMON PO Take 1,000 mg by mouth daily.  . Coenzyme Q10 200 MG TABS Take 200 mg by mouth daily.  . fish oil-omega-3 fatty acids 1000 MG capsule Take 2 g by mouth daily.  . Flaxseed, Linseed, (FLAXSEED OIL PO) Take 1,000 mg by mouth daily.  . hydrocortisone (PROCTO-MED HC) 2.5 % rectal cream User 3 to 4 x / day as needed  .  Magnesium 250 MG TABS Take 250 mg by mouth daily.  . Multiple Vitamins-Minerals (MULTIVITAMIN PO) Take by mouth daily.  . pseudoephedrine (SUDAFED) 30 MG tablet Take 30 mg by mouth every 4 (four) hours as needed for congestion.  . triamcinolone ointment (KENALOG) 0.1 % Apply 1 application topically 2 (two) times daily.  . vitamin C (ASCORBIC ACID) 500 MG tablet Take 500 mg by mouth daily.   No current facility-administered medications on file prior to visit.    Past Medical History:  Diagnosis Date  . Cancer [facility])    prostate - basal cell  . GERD (gastroesophageal reflux disease)   . Hearing loss    wear hearing aid  . Hyperlipidemia   . Hypertension   . Inguinal hernia   . No pertinent past medical history   . Prediabetes   . Vitamin D deficiency   . Wears glasses   . Wears hearing aid    both ears   Health Maintenance  Topic Date Due  . INFLUENZA VACCINE  07/28/2019  . TETANUS/TDAP  08/07/2028  . PNA vac Low Risk Adult  Completed   Immunization History  Administered Date(s) Administered  . DT 09/03/2015  . Fluad Quad(high Dose 65+) 10/13/2019  . Influenza Inj Mdck Quad Pf 11/10/2018  . Influenza Split 10/28/2016  . Influenza, High Dose Seasonal PF 09/27/2014, 09/03/2015,  10/05/2017  . Influenza,inj,Quad PF,6+ Mos 10/11/2018  . Pneumococcal Conjugate-13 10/16/2014  . Pneumococcal-Unspecified 02/03/2011  . Td 07/26/2005, 08/07/2018  . Zoster 12/27/2006   Last Colon - 04/2012 - Dr Arty Baumgartner - recc no f/u due to age   Past Surgical History:  Procedure Laterality Date  . COLONOSCOPY     x2  . INGUINAL HERNIA REPAIR  01/15/2013   Procedure: HERNIA REPAIR INGUINAL ADULT;  Surgeon: Gwenyth Ober, MD;  [place];  Service: General;  Laterality: Right;  . [place] or 2000   due to cancer  . TONSILLECTOMY     as a child   Family History  Problem Relation Age of Onset  . Other Mother        bowel infarction  . COPD Father   .  Heart disease Father   . Autism Son    Social History   Socioeconomic History  . Marital status: Married    Spouse name: Vaughan Basta  . Number of children: 2 sons with Autism  Occupational History  . 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 Smoking status: Former Smoker    Packs/day: 1.00    Years: 40.00    Pack years: 40.00    Quit date: 01/10/1999    Years since quitting: 20.8  . Smokeless tobacco: Never Used  Substance and Sexual Activity  . Alcohol use: No    Comment: occasional  . Drug use: No  . Sexual activity: Not on file    ROS Constitutional: Denies fever, chills, weight loss/gain, headaches, insomnia,  night sweats or change in appetite. Does c/o fatigue. Eyes: Denies redness, blurred vision, diplopia, discharge, itchy or watery eyes.  ENT: Denies discharge, congestion, post nasal drip, epistaxis, sore throat, earache, hearing loss, dental pain, Tinnitus, Vertigo, Sinus pain or snoring.  Cardio: Denies chest pain, palpitations, irregular heartbeat, syncope, dyspnea, diaphoresis, orthopnea, PND, claudication or edema Respiratory: denies cough, dyspnea, DOE, pleurisy, hoarseness, laryngitis or wheezing.  Gastrointestinal: Denies dysphagia, heartburn, reflux, water brash, pain, cramps, nausea, vomiting, bloating, diarrhea, constipation, hematemesis, melena, hematochezia, jaundice or hemorrhoids Genitourinary: Denies dysuria, frequency, discharge, hematuria or flank pain. Has urgency, nocturia x 2-3 & occasional hesitancy. Musculoskeletal: Denies arthralgia, myalgia, stiffness, Jt. Swelling, pain, limp or strain/sprain. Denies Falls. Skin: Denies puritis, rash, hives, warts, acne, eczema or change in skin lesion Neuro: No weakness, tremor, incoordination, spasms, paresthesia or pain Psychiatric: Denies confusion, memory loss or sensory loss. Denies Depression. Endocrine: Denies change in weight, skin, hair change, nocturia, and paresthesia, diabetic  polys, visual blurring or hyper / hypo glycemic episodes.  Heme/Lymph: No excessive bleeding, bruising or enlarged lymph nodes.  Physical Exam  BP 108/64   Pulse (!) 56   Temp (!) 97.5 F (36.4 C)   Resp 16   Ht 5\' 10"  (1.778 m)   Wt 157 lb 9.6 oz (71.5 kg)   BMI 22.61 kg/m   General Appearance: Well nourished and well groomed and in no apparent distress.  Eyes: PERRLA, EOMs, conjunctiva no swelling or erythema, normal fundi and vessels. Sinuses: No frontal/maxillary tenderness ENT/Mouth: EACs patent / TMs  nl. Nares clear without erythema, swelling, mucoid exudates. Oral hygiene is good. No erythema, swelling, or exudate. Tongue normal, non-obstructing. Tonsils not swollen or erythematous. Hearing normal.  Neck: Supple, thyroid not palpable. No bruits, nodes or JVD. Respiratory: Respiratory effort normal.  BS equal and clear bilateral without rales, rhonci, wheezing or stridor. Cardio: Heart  sounds are normal with regular rate and rhythm and no murmurs, rubs or gallops. Peripheral pulses are normal and equal bilaterally without edema. No aortic or femoral bruits. Chest: symmetric with normal excursions and percussion.  Abdomen: Soft, with Nl bowel sounds. Nontender, no guarding, rebound, hernias, masses, or organomegaly.  Lymphatics: Non tender without lymphadenopathy.  Musculoskeletal: Full ROM all peripheral extremities, joint stability, 5/5 strength, and normal gait. Skin: Warm and dry without rashes, lesions, cyanosis, clubbing or  ecchymosis.  Neuro: Cranial nerves intact, reflexes equal bilaterally. Normal muscle tone, no cerebellar symptoms. Sensation intact.  Pysch: Alert and oriented X 3 with normal affect, insight and judgment appropriate.   Assessment and Plan  1. Essential hypertension  - EKG 12-Lead - [place], RETROPERITNL ABD,  LTD - Urinalysis, Routine w reflex microscopic - Microalbumin / creatinine urine ratio - CBC with Differential/Platelet - COMPLETE METABOLIC  PANEL WITH GFR - Magnesium - TSH  2. Hyperlipidemia, mixed  - EKG 12-Lead - [place], RETROPERITNL ABD,  LTD - Lipid panel - TSH  3. Abnormal glucose  - EKG 12-Lead - [place], RETROPERITNL ABD,  LTD - Hemoglobin A1c  4. Vitamin D deficiency  - VITAMIN D 25 Hydroxyl  5. Prediabetes  - EKG 12-Lead - [place], RETROPERITNL ABD,  LTD - Hemoglobin A1c - Insulin, random  6. Screening for colorectal cancer  - POC Hemoccult Bld/Stl  7. BPH with obstruction/lower urinary tract symptoms  - PSA  8. History of prostate cancer  - PSA  9. Screening for ischemic heart disease  - EKG 12-Lead  10. FHx: heart disease  - EKG 12-Lead - [place], RETROPERITNL ABD,  LTD  11. Screening for AAA (aortic abdominal aneurysm)  - [place], RETROPERITNL ABD,  LTD  12. BMI 23.0-23.9, adult  - Urinalysis, Routine w reflex microscopic - Microalbumin / creatinine urine ratio - CBC with Differential/Platelet - COMPLETE METABOLIC PANEL WITH GFR - Magnesium - Lipid panel - TSH - Hemoglobin A1c - Insulin, random - VITAMIN D 25 Hydroxyl  13. Former smoker         Patient was counseled in prudent diet, weight control to achieve/maintain BMI less than 25, BP monitoring, regular exercise and medications as discussed.  Discussed med effects and SE's. Routine screening labs and tests as requested with regular follow-up as recommended. Over 40 minutes of exam, counseling, chart review and high complex critical decision making was performed   Kirtland Bouchard, MD

## 2019-10-24 ENCOUNTER — Ambulatory Visit (INDEPENDENT_AMBULATORY_CARE_PROVIDER_SITE_OTHER): Payer: Medicare Other | Admitting: Internal Medicine

## 2019-10-24 ENCOUNTER — Other Ambulatory Visit: Payer: Self-pay

## 2019-10-24 VITALS — BP 108/64 | HR 56 | Temp 97.5°F | Resp 16 | Ht 70.0 in | Wt 157.6 lb

## 2019-10-24 DIAGNOSIS — Z8249 Family history of ischemic heart disease and other diseases of the circulatory system: Secondary | ICD-10-CM | POA: Diagnosis not present

## 2019-10-24 DIAGNOSIS — N138 Other obstructive and reflux uropathy: Secondary | ICD-10-CM | POA: Diagnosis not present

## 2019-10-24 DIAGNOSIS — E559 Vitamin D deficiency, unspecified: Secondary | ICD-10-CM | POA: Diagnosis not present

## 2019-10-24 DIAGNOSIS — Z136 Encounter for screening for cardiovascular disorders: Secondary | ICD-10-CM | POA: Diagnosis not present

## 2019-10-24 DIAGNOSIS — Z6823 Body mass index (BMI) 23.0-23.9, adult: Secondary | ICD-10-CM

## 2019-10-24 DIAGNOSIS — I10 Essential (primary) hypertension: Secondary | ICD-10-CM | POA: Diagnosis not present

## 2019-10-24 DIAGNOSIS — Z87891 Personal history of nicotine dependence: Secondary | ICD-10-CM | POA: Diagnosis not present

## 2019-10-24 DIAGNOSIS — Z8546 Personal history of malignant neoplasm of prostate: Secondary | ICD-10-CM | POA: Diagnosis not present

## 2019-10-24 DIAGNOSIS — E782 Mixed hyperlipidemia: Secondary | ICD-10-CM | POA: Diagnosis not present

## 2019-10-24 DIAGNOSIS — R7309 Other abnormal glucose: Secondary | ICD-10-CM

## 2019-10-24 DIAGNOSIS — R7303 Prediabetes: Secondary | ICD-10-CM | POA: Diagnosis not present

## 2019-10-24 DIAGNOSIS — Z1211 Encounter for screening for malignant neoplasm of colon: Secondary | ICD-10-CM

## 2019-10-24 DIAGNOSIS — N401 Enlarged prostate with lower urinary tract symptoms: Secondary | ICD-10-CM | POA: Diagnosis not present

## 2019-10-24 NOTE — Patient Instructions (Signed)

## 2019-10-25 LAB — COMPLETE METABOLIC PANEL WITH GFR
AG Ratio: 1.6 (calc) (ref 1.0–2.5)
ALT: 22 U/L (ref 9–46)
AST: 23 U/L (ref 10–35)
Albumin: 4.1 g/dL (ref 3.6–5.1)
Alkaline phosphatase (APISO): 62 U/L (ref 35–144)
BUN/Creatinine Ratio: 14 (calc) (ref 6–22)
BUN: 16 mg/dL (ref 7–25)
CO2: 29 mmol/L (ref 20–32)
Calcium: 9.4 mg/dL (ref 8.6–10.3)
Chloride: 107 mmol/L (ref 98–110)
Creat: 1.13 mg/dL — ABNORMAL HIGH (ref 0.70–1.11)
GFR, Est African American: 70 mL/min/{1.73_m2} (ref >=60)
GFR, Est Non African American: 61 mL/min/{1.73_m2} (ref >=60)
Globulin: 2.5 g/dL (calc) (ref 1.9–3.7)
Glucose, Bld: 121 mg/dL — ABNORMAL HIGH (ref 65–99)
Potassium: 4.5 mmol/L (ref 3.5–5.3)
Sodium: 143 mmol/L (ref 135–146)
Total Bilirubin: 0.6 mg/dL (ref 0.2–1.2)
Total Protein: 6.6 g/dL (ref 6.1–8.1)

## 2019-10-25 LAB — CBC WITH DIFFERENTIAL/PLATELET
Absolute Monocytes: 437 cells/uL (ref 200–950)
Basophils Absolute: 57 cells/uL (ref 0–200)
Basophils Relative: 0.7 %
Eosinophils Absolute: 292 cells/uL (ref 15–500)
Eosinophils Relative: 3.6 %
HCT: 45.9 % (ref 38.5–50.0)
Hemoglobin: 15.6 g/dL (ref 13.2–17.1)
Lymphs Abs: 1385 cells/uL (ref 850–3900)
MCH: 31.2 pg (ref 27.0–33.0)
MCHC: 34 g/dL (ref 32.0–36.0)
MCV: 91.8 fL (ref 80.0–100.0)
MPV: 10.9 fL (ref 7.5–12.5)
Monocytes Relative: 5.4 %
Neutro Abs: 5929 cells/uL (ref 1500–7800)
Neutrophils Relative %: 73.2 %
Platelets: 251 10*3/uL (ref 140–400)
RBC: 5 10*6/uL (ref 4.20–5.80)
RDW: 12.7 % (ref 11.0–15.0)
Total Lymphocyte: 17.1 %
WBC: 8.1 10*3/uL (ref 3.8–10.8)

## 2019-10-25 LAB — INSULIN, RANDOM: Insulin: 65.6 u[IU]/mL — ABNORMAL HIGH

## 2019-10-25 LAB — HEMOGLOBIN A1C
Hgb A1c MFr Bld: 5.4 % of total Hgb (ref <5.7)
Mean Plasma Glucose: 108 (calc)
eAG (mmol/L): 6 (calc)

## 2019-10-25 LAB — PSA: PSA: <0.1 ng/mL (ref <=4.0)

## 2019-10-25 LAB — URINALYSIS, ROUTINE W REFLEX MICROSCOPIC
Bilirubin Urine: NEGATIVE
Glucose, UA: NEGATIVE
Hgb urine dipstick: NEGATIVE
Ketones, ur: NEGATIVE
Leukocytes,Ua: NEGATIVE
Nitrite: NEGATIVE
Protein, ur: NEGATIVE
Specific Gravity, Urine: 1.02 (ref 1.001–1.03)
pH: <=5 (ref 5.0–8.0)

## 2019-10-25 LAB — LIPID PANEL
Cholesterol: 141 mg/dL (ref <200)
HDL: 42 mg/dL (ref >=40)
LDL Cholesterol (Calc): 79 mg/dL (calc)
Non-HDL Cholesterol (Calc): 99 mg/dL (calc) (ref <130)
Total CHOL/HDL Ratio: 3.4 (calc) (ref <5.0)
Triglycerides: 117 mg/dL (ref <150)

## 2019-10-25 LAB — MICROALBUMIN / CREATININE URINE RATIO
Creatinine, Urine: 194 mg/dL (ref 20–320)
Microalb Creat Ratio: 20 mcg/mg creat (ref <30)
Microalb, Ur: 3.9 mg/dL

## 2019-10-25 LAB — TSH: TSH: 2.6 mIU/L (ref 0.40–4.50)

## 2019-10-25 LAB — VITAMIN D 25 HYDROXY (VIT D DEFICIENCY, FRACTURES): Vit D, 25-Hydroxy: 69 ng/mL (ref 30–100)

## 2019-10-25 LAB — MAGNESIUM: Magnesium: 2.2 mg/dL (ref 1.5–2.5)

## 2019-10-27 ENCOUNTER — Encounter: Payer: Self-pay | Admitting: Internal Medicine

## 2019-12-12 ENCOUNTER — Other Ambulatory Visit: Payer: Self-pay

## 2019-12-12 DIAGNOSIS — Z1211 Encounter for screening for malignant neoplasm of colon: Secondary | ICD-10-CM

## 2019-12-12 LAB — POC HEMOCCULT BLD/STL (HOME/3-CARD/SCREEN)
Card #2 Fecal Occult Blod, POC: NEGATIVE
Card #3 Fecal Occult Blood, POC: NEGATIVE
Fecal Occult Blood, POC: NEGATIVE

## 2019-12-17 DIAGNOSIS — Z1212 Encounter for screening for malignant neoplasm of rectum: Secondary | ICD-10-CM | POA: Diagnosis not present

## 2019-12-17 DIAGNOSIS — Z1211 Encounter for screening for malignant neoplasm of colon: Secondary | ICD-10-CM | POA: Diagnosis not present

## 2019-12-18 ENCOUNTER — Ambulatory Visit: Payer: Self-pay | Admitting: Adult Health

## 2020-01-17 ENCOUNTER — Ambulatory Visit: Payer: Medicare Other | Attending: Internal Medicine

## 2020-01-17 DIAGNOSIS — Z23 Encounter for immunization: Secondary | ICD-10-CM

## 2020-01-17 NOTE — Progress Notes (Signed)
   Covid-19 Vaccination Clinic  Name:  Brian Proctor    MRN: [id number] DOB: 09/25/1936  01/17/2020  Mr. Brian Proctor was observed post Covid-19 immunization for 15 minutes without incidence. He was provided with Vaccine Information Sheet and instruction to access the V-Safe system.   Mr. Brian Proctor was instructed to call 911 with any severe reactions post vaccine: [name] Difficulty breathing  . Swelling of your face and throat  . A fast heartbeat  . A bad rash all over your body  . Dizziness and weakness    Immunizations Administered    Name Date Dose VIS Date Route   Pfizer COVID-19 Vaccine 01/17/2020 10:20 AM 0.3 mL 12/07/2019 Intramuscular   Manufacturer: [place]   Lot: [id number]   [place]: [id number]

## 2020-01-24 ENCOUNTER — Encounter: Payer: Self-pay | Admitting: Adult Health

## 2020-01-24 NOTE — Progress Notes (Signed)
MEDICARE ANNUAL WELLNESS VISIT AND FOLLOW UP Assessment:   Diagnoses and all orders for this visit:  Encounter for Medicare annual wellness exam  Essential hypertension At goal off of medications Monitor blood pressure at home; call if consistently over 130/80 Continue DASH diet.   Reminder to go to the ER if any CP, SOB, nausea, dizziness, severe HA, changes vision/speech, left arm numbness and tingling and jaw pain.  Gastroesophageal reflux disease, esophagitis presence not specified Currently well controlled with lifestyle changes - avoiding triggers, excessively large meals, lying down after eating OTC agents for rare symptoms; contact office if frequency increases  History of prostate cancer S/p prostatectomy 1999; no issues or suspected recurrence.  Wears pad for incontinence and doing well Continue to monitor.   Mixed hyperlipidemia Continue medications and supplements for LDL goal <100 Continue low cholesterol diet and exercise.  Check lipid panel.   History of elevated glucose Previously prediabetic currently well controlled with lifestyle changes Encouraged to continue with lifestyle Monitor A1C as needed; defer today, check CMP  Vitamin D deficiency Continue supplementation and routine monitoring At goal at recent check; continue to recommend supplementation for goal of 60-100 Defer vitamin D level  History of basal cell carcinoma Continue follow up with derm q6 months or as recommended; wear sunscreen when outdoors and reapply regularly.  Body mass index (BMI) of 22.0-22.9 in adult Rec. Maintain weight with balanced diet and exercise  Former smoker/40+ pack year hx No sx, last cxr 2019, no sx, declines imaging at this time  Memory changes Had normal basic labs, smoker 40+ pack year, had MRI suggestive of microvascular changes Declined namenda/aricept, discussed today, patient would like to discuss with neurology Continue ASA, statin    Over 30 minutes of  exam, counseling, chart review, and critical decision making was performed  Future Appointments  Date Time Provider [place]  02/07/2020 10:45 AM Avis [facility]  05/05/2020 10:30 AM Unk Pinto, MD [facility] None  11/25/2020  9:00 AM Unk Pinto, MD [facility] None     Plan:   During the course of the visit the patient was educated and counseled about appropriate screening and preventive services including:    Pneumococcal vaccine   Influenza vaccine  Prevnar 13  Td vaccine  Screening electrocardiogram  Colorectal cancer screening  Diabetes screening  Glaucoma screening  Nutrition counseling    Subjective:  Brian Proctor is a 84 y.o. male who presents for Medicare Annual Wellness Visit and 3 month follow up for HTN, hyperlipidemia, history prediabetes, and vitamin D Def.   S/p prostatectomy for CA in 1999 - has incontinence for which he wears a pad.   He has hx of basal cell skin CA for which he sees dermatology q39m for monitoring.   GERD well controlled by lifestyle modification; uses alka-seltzer for rare symptoms. He does endorse increased belching but denies other symptoms. He feels well controlled with current regimen.   He has reported ongoing gradual short term memory loss, had MRI in 06/2019 for these reports which showed microvascular changes. Declined namenda/aricept at that time, had basic labs at that time including b12 which were normal, today repots interest in seeing a neurologist to discuss further.   BMI is Body mass index is 23.19 kg/m., he has been working on diet and exercise. His various medical conditions are very well controlled - most managed by lifestyle modification. He continues to walk 60 min 3-4 times a week without issues, golfs and does regular  yard work.  Wt Readings from Last 3 Encounters:  02/04/20 161 lb 9.6 oz (73.3 kg)  10/24/19 157 lb 9.6 oz (71.5 kg)  08/06/19 161 lb (73 kg)   His  blood pressure has been controlled at home, today their BP is BP: 124/60 He does workout. He denies chest pain, shortness of breath, dizziness.   He is on cholesterol medication (atorvastatatin 40 mg daily), on omega 3 supplement and lifestyle changes. His cholesterol is at goal. The cholesterol last visit was:  Lab Results  Component Value Date   CHOL 141 10/24/2019   HDL 42 10/24/2019   LDLCALC 79 10/24/2019   TRIG 117 10/24/2019   CHOLHDL 3.4 10/24/2019   He has been working on diet and exercise for history of prediabetes, and denies increased appetite, nausea, paresthesia of the feet, polydipsia, polyuria and visual disturbances. Last A1C in the office was:  Lab Results  Component Value Date   HGBA1C 5.4 10/24/2019   Last GFR Lab Results  Component Value Date   GFRNONAA 61 10/24/2019    Patient is on Vitamin D supplement.   Lab Results  Component Value Date   VD25OH 69 10/24/2019      Medication Review: Current Outpatient Medications on File Prior to Visit  Medication Sig Dispense Refill  . Ascorbic Acid (VITAMIN C PO) Take by mouth daily.    [name] aspirin 81 MG tablet Take 81 mg by mouth daily.    [name] atorvastatin (LIPITOR) 40 MG tablet TAKE 1 TABLET BY MOUTH DAILY 90 tablet 1  . Cholecalciferol (VITAMIN D PO) Take by mouth daily.    [name] CINNAMON PO Take 1,000 mg by mouth daily.    . Coenzyme Q10 200 MG TABS Take 200 mg by mouth daily.    . fish oil-omega-3 fatty acids 1000 MG capsule Take 2 g by mouth daily.    . Flaxseed, Linseed, (FLAXSEED OIL PO) Take 1,000 mg by mouth daily.    . hydrocortisone (PROCTO-MED HC) 2.5 % rectal cream User 3 to 4 x / day as needed 30 g 11  . Magnesium 250 MG TABS Take 250 mg by mouth daily.    . Multiple Vitamins-Minerals (MULTIVITAMIN PO) Take by mouth daily.    . pseudoephedrine (SUDAFED) 30 MG tablet Take 30 mg by mouth every 4 (four) hours as needed for congestion.    . triamcinolone ointment (KENALOG) 0.1 % Apply 1 application topically 2  (two) times daily. 80 g 1  . vitamin C (ASCORBIC ACID) 500 MG tablet Take 500 mg by mouth daily.     No current facility-administered medications on file prior to visit.    Allergies: No Known Allergies  Current Problems (verified) has Hyperlipidemia, mixed; Essential hypertension; GERD (gastroesophageal reflux disease); Vitamin D deficiency; Medication management; History of prostate cancer; Encounter for Medicare annual wellness exam; BMI 22.0-22.9, adult; History of basal cell carcinoma; Former smoker; FH: heart disease; COPD (chronic obstructive pulmonary disease) ([place]); and Abnormal glucose on their problem list.  Screening Tests Immunization History  Administered Date(s) Administered  . DT (Pediatric) 09/03/2015  . Fluad Quad(high Dose 65+) 10/13/2019  . Influenza Inj Mdck Quad Pf 11/10/2018  . Influenza Split 10/28/2016  . Influenza, High Dose Seasonal PF 09/27/2014, 09/03/2015, 10/05/2017  . Influenza,inj,Quad PF,6+ Mos 10/11/2018  . PFIZER SARS-COV-2 Vaccination 01/17/2020  . Pneumococcal Conjugate-13 10/16/2014  . Pneumococcal-Unspecified 02/03/2011  . Td 07/26/2005, 08/07/2018  . Zoster 12/27/2006   Preventative care: Last colonoscopy: 2015  CXR 2019 MRI brain 07/2019  microvascular changes  Prior vaccinations: TD or Tdap: 2016  Influenza: 2020 Pneumococcal: 2012 Prevnar13: 2015 Shingles/Zostavax: 2008  Names of Other Physician/Practitioners you currently use: 1.  [facility] here for primary care 2. [facility], eye doctor, last visit 2016, wears glasses, no issues 3. [facility], dentist, last visit 2020 - goes q6 months  Sees dermatology q 6 months   Patient Care Team: Unk Pinto, MD as PCP - General (Internal Medicine)  Surgical: He  has a past surgical history that includes Tonsillectomy; Prostate surgery (1999 or 2000); Colonoscopy; and Inguinal hernia repair (01/15/2013). Family His family history  includes Autism in his son; COPD in his father; Heart disease in his father; Other in his mother. Social history  He reports that he quit smoking about 21 years ago. He has a 40.00 pack-year smoking history. He has never used smokeless tobacco. He reports that he does not drink alcohol or use drugs.  MEDICARE WELLNESS OBJECTIVES: Physical activity: Current Exercise Habits: Home exercise routine, Type of exercise: walking, Time (Minutes): 35, Frequency (Times/Week): 7, Weekly Exercise (Minutes/Week): 245, Intensity: Mild, Exercise limited by: None identified Cardiac risk factors: Cardiac Risk Factors include: advanced age (>27men, >30 women);dyslipidemia;hypertension;smoking/ tobacco exposure;male gender Depression/mood screen:   Depression screen [facility] 2/9 02/04/2020  Decreased Interest 0  Down, Depressed, Hopeless 0  PHQ - 2 Score 0    ADLs:  In your present state of health, do you have any difficulty performing the following activities: 02/04/2020 10/27/2019  Hearing? Y N  Comment wears left hearing aid -  Vision? N N  Difficulty concentrating or making decisions? Y N  Walking or climbing stairs? N N  Dressing or bathing? N N  Doing errands, shopping? N N  Some recent data might be hidden     Cognitive Testing  Alert? Yes  Normal Appearance?Yes  Oriented to person? Yes  Place? Yes   Time? Yes  Recall of three objects?  No 1/3  Can perform simple calculations? Yes  Displays appropriate judgment?Yes  Can read the correct time from a watch face?Yes  EOL planning: Does Patient Have a Medical Advance Directive?: Yes Type of Advance Directive: Healthcare Power of Attorney, Living will Does patient want to make changes to medical advance directive?: No - Patient declined Copy of [place] in Chart?: No - copy requested   Objective:   Today's Vitals   02/04/20 0905  BP: 124/60  Pulse: 72  Resp: 16  Temp: (!) 97 F (36.1 C)  Weight: 161 lb 9.6 oz (73.3 kg)   Height: 5\' 10"  (1.778 m)   Body mass index is 23.19 kg/m.  General appearance: alert, no distress, WD/WN, male HEENT: normocephalic, sclerae anicteric, TMs pearly, nares patent, no discharge or erythema, pharynx normal Oral cavity: MMM, no lesions Neck: supple, no lymphadenopathy, no thyromegaly, no masses Heart: RRR, normal S1, S2, no murmurs Lungs: CTA bilaterally, no wheezes, rhonchi, or rales Abdomen: +bs, soft, non tender, non distended, no masses, no hepatomegaly, no splenomegaly Musculoskeletal: nontender, no swelling, no obvious deformity Extremities: no edema, no cyanosis, no clubbing Pulses: 2+ symmetric, upper and lower extremities, normal cap refill Neurological: alert, oriented x 3, CN2-12 intact, strength normal upper extremities and lower extremities, sensation normal throughout, DTRs 2+ throughout, no cerebellar signs, gait normal. Poor short term recall.  Psychiatric: normal affect, behavior normal, pleasant  Skin: warm/dry, intact, no rashes  Medicare Attestation I have personally reviewed: The patient's medical and social history Their use  of alcohol, tobacco or illicit drugs Their current medications and supplements The patient's functional ability including ADLs,fall risks, home safety risks, cognitive, and hearing and visual impairment Diet and physical activities Evidence for depression or mood disorders  The patient's weight, height, BMI, and visual acuity have been recorded in the chart.  I have made referrals, counseling, and provided education to the patient based on review of the above and I have provided the patient with a written personalized care plan for preventive services.     Izora Ribas, NP   02/04/2020

## 2020-02-04 ENCOUNTER — Ambulatory Visit (INDEPENDENT_AMBULATORY_CARE_PROVIDER_SITE_OTHER): Payer: Medicare Other | Admitting: Adult Health

## 2020-02-04 ENCOUNTER — Other Ambulatory Visit: Payer: Self-pay

## 2020-02-04 ENCOUNTER — Encounter: Payer: Self-pay | Admitting: Adult Health

## 2020-02-04 VITALS — BP 124/60 | HR 72 | Temp 97.0°F | Resp 16 | Ht 70.0 in | Wt 161.6 lb

## 2020-02-04 DIAGNOSIS — Z85828 Personal history of other malignant neoplasm of skin: Secondary | ICD-10-CM | POA: Diagnosis not present

## 2020-02-04 DIAGNOSIS — Z8546 Personal history of malignant neoplasm of prostate: Secondary | ICD-10-CM

## 2020-02-04 DIAGNOSIS — K219 Gastro-esophageal reflux disease without esophagitis: Secondary | ICD-10-CM

## 2020-02-04 DIAGNOSIS — R413 Other amnesia: Secondary | ICD-10-CM | POA: Diagnosis not present

## 2020-02-04 DIAGNOSIS — Z6822 Body mass index (BMI) 22.0-22.9, adult: Secondary | ICD-10-CM

## 2020-02-04 DIAGNOSIS — E782 Mixed hyperlipidemia: Secondary | ICD-10-CM | POA: Diagnosis not present

## 2020-02-04 DIAGNOSIS — I10 Essential (primary) hypertension: Secondary | ICD-10-CM

## 2020-02-04 DIAGNOSIS — R6889 Other general symptoms and signs: Secondary | ICD-10-CM

## 2020-02-04 DIAGNOSIS — E559 Vitamin D deficiency, unspecified: Secondary | ICD-10-CM | POA: Diagnosis not present

## 2020-02-04 DIAGNOSIS — Z8249 Family history of ischemic heart disease and other diseases of the circulatory system: Secondary | ICD-10-CM | POA: Diagnosis not present

## 2020-02-04 DIAGNOSIS — R7309 Other abnormal glucose: Secondary | ICD-10-CM

## 2020-02-04 DIAGNOSIS — Z79899 Other long term (current) drug therapy: Secondary | ICD-10-CM | POA: Diagnosis not present

## 2020-02-04 DIAGNOSIS — Z87891 Personal history of nicotine dependence: Secondary | ICD-10-CM

## 2020-02-04 DIAGNOSIS — Z0001 Encounter for general adult medical examination with abnormal findings: Secondary | ICD-10-CM | POA: Diagnosis not present

## 2020-02-04 DIAGNOSIS — J449 Chronic obstructive pulmonary disease, unspecified: Secondary | ICD-10-CM | POA: Diagnosis not present

## 2020-02-04 DIAGNOSIS — Z0000 Encounter for general adult medical examination without abnormal findings: Secondary | ICD-10-CM

## 2020-02-04 MED ORDER — HYDROCORT-PRAMOXINE (PERIANAL) 2.5-1 % EX CREA: TOPICAL_CREAM | CUTANEOUS | 2 refills | Status: DC

## 2020-02-04 NOTE — Patient Instructions (Addendum)
Brian Proctor , Thank you for taking time to come for your Medicare Wellness Visit. I appreciate your ongoing commitment to your health goals. Please review the following plan we discussed and let me know if I can assist you in the future.   These are the goals we discussed: Goals    . Exercise 150 min/wk Moderate Activity     Incorporate weights/resistance exercises     . LDL CALC < 100       This is a list of the screening recommended for you and due dates:  Health Maintenance  Topic Date Due  . Tetanus Vaccine  08/07/2028  . Flu Shot  Completed  . Pneumonia vaccines  Completed    Please schedule a screening eye exam   Please check with your wife about last dermatology appointment - hx of skin cancer, needs to see at minimum once a year unless advised otherwise by them    Will refer to neurology per your request to discuss memory    Memory Compensation Strategies  1. Use "WARM" strategy.  W= write it down  A= associate it  R= repeat it  M= make a mental note  2.   You can keep a Social worker.  Use a 3-ring notebook with sections for the following: calendar, important names and phone numbers,  medications, doctors' names/phone numbers, lists/reminders, and a section to journal what you did  each day.   3.    Use a calendar to write appointments down.  4.    Write yourself a schedule for the day.  This can be placed on the calendar or in a separate section of the Memory Notebook.  Keeping a  regular schedule can help memory.  5.    Use medication organizer with sections for each day or morning/evening pills.  You may need help loading it  6.    Keep a basket, or pegboard by the door.  Place items that you need to take out with you in the basket or on the pegboard.  You may also want to  include a message board for reminders.  7.    Use sticky notes.  Place sticky notes with reminders in a place where the task is performed.  For example: " turn off the  stove"  placed by the stove, "lock the door" placed on the door at eye level, " take your medications" on  the bathroom mirror or by the place where you normally take your medications.  8.    Use alarms/timers.  Use while cooking to remind yourself to check on food or as a reminder to take your medicine, or as a  reminder to make a call, or as a reminder to perform another task, etc.  Vascular Dementia Dementia is a condition in which a person has problems with thinking, memory, and behavior that are severe enough to interfere with daily life. Vascular dementia is a type of dementia. It results from brain damage that is caused by the brain not getting enough blood. This condition may also be called vascular cognitive impairment. What are the causes? Vascular dementia is caused by conditions that lessen blood flow to the brain. Common causes of this condition include:  Multiple small strokes. These may happen without symptoms (silent stroke).  Major stroke.  Damage to small blood vessels in the brain (cerebral small vessel disease). What increases the risk? The following factors may make you more likely to develop this condition:  Having had a  stroke.  Having high blood pressure (hypertension) or high cholesterol.  Having a disease that affects the heart or blood vessels.  Smoking.  Having diabetes.  Having metabolic syndrome.  Being obese.  Not being active.  Having depression.  Being over age 15. What are the signs or symptoms? Symptoms can vary from one person to another. Symptoms may be mild or severe depending on the amount of damage and which parts of the brain have been affected. Symptoms may begin suddenly or may develop slowly. Mental symptoms of vascular dementia may include:  Confusion.  Memory problems.  Poor attention and concentration.  Trouble understanding speech.  Depression.  Personality changes.  Trouble recognizing familiar people.  Agitation or  aggression.  Paranoia.  Delusions or hallucinations. Physical symptoms of vascular dementia may include:  Weakness.  Poor balance.  Loss of bladder or bowel control (incontinence).  Unsteady walking (gait).  Speaking problems. Behavioral symptoms of vascular dementia may include:  Getting lost in familiar places.  Problems with planning and judgment.  Trouble following instructions.  Social problems.  Emotional outbursts.  Trouble with daily activities and self-care.  Problems handling money. Symptoms may remain stable, or they may get worse over time. Symptoms of vascular dementia may be similar to those of Alzheimer's disease. The two conditions can occur together (mixed dementia). How is this diagnosed? Your health care provider will consider your medical history and symptoms or changes that are reported by friends and family. Your health care provider will do a physical exam and may order lab tests or other tests that check brain and nervous system function. Tests that may be done include:  Blood tests.  Brain imaging tests.  Tests of movement, speech, and other daily activities (neurological exam).  Tests of memory, thinking, and problem-solving (neuropsychological or neurocognitive testing). There is not a specific test to diagnose vascular dementia. Diagnosis may involve several specialists. These may include:  A health care provider who specializes in the brain and nervous system (neurologist).  A health provider who specializes in understanding how problems in the brain can alter behavior and cognitive function (neuropsychologist). How is this treated? There is no cure for vascular dementia. Brain damage that has already occurred cannot be reversed. Treatment depends on:  How severe the condition is.  Which parts of your brain have been affected.  Your overall health. Treatment measures aim to:  Treat the underlying cause of vascular dementia and  manage risk factors. This may include: ? Controlling blood pressure. ? Lowering cholesterol. ? Treating diabetes. ? Quitting smoking. ? Losing weight or maintaining a healthy weight. ? Eating a healthy, balanced diet. ? Getting regular exercise.  Manage symptoms.  Prevent further brain damage.  Improve the person's health and quality of life. Treatment for dementia may involve a team of health care providers, including:  A neurologist.  A provider who specializes in disorders of the mind (psychiatrist).  A provider who specializes in helping people learn daily living skills (occupational therapist).  A provider who focuses on speech and language changes (Electrical engineer).  A heart specialist (cardiologist).  A provider who helps people learn how to manage physical changes, such as movement and walking (exercise physiologist or physical therapist). Follow these instructions at home: Lifestyle  People with vascular dementia may need regular help at home or daily care from a family member or home health care worker. Home care for a person with vascular dementia depends on what caused the condition and how severe the symptoms  are. General guidelines for caregivers include:  Help the person with dementia remember people, appointments, and daily activities.  Help the person with dementia manage his or her medicines.  Help family and friends learn about ways to communicate with the person with dementia.  Create a safe living space to reduce the risk of injury or falls.  Find a support group to help caregivers and family cope with the effects of dementia.  General instructions  Help the person take over-the-counter and prescription medicines only as told by the health care provider.  Follow the health care provider's instructions for treating the condition that caused the dementia.  Make sure the person keeps all follow-up visits as told by the health care provider. This  is important. Contact a health care provider if:  A fever develops.  New behavioral problems develop.  Problems with swallowing develop.  Confusion gets worse.  Sleepiness gets worse. Get help right away if:  Loss of consciousness occurs.  There is a sudden loss of speech, balance, or thinking ability.  New numbness or paralysis occurs.  Sudden, severe headache occurs.  Vision is lost or suddenly gets worse in one or both eyes. Summary  Vascular dementia is a type of dementia. It results from brain damage that is caused by the brain not getting enough blood.  Vascular dementia is caused by conditions that lessen blood flow to the brain. Common causes of this condition include stroke and damage to small blood vessels in the brain.  Treatment focuses on treating the underlying cause of vascular dementia and managing any risk factors.  People with vascular dementia may need regular help at home or daily care from a family member or home health care worker.  Contact a health care provider if you or your caregiver notice any new symptoms. This information is not intended to replace advice given to you by your health care provider. Make sure you discuss any questions you have with your health care provider. Document Revised: 09/13/2018 Document Reviewed: 09/14/2018 Elsevier Patient Education  [place].

## 2020-02-05 DIAGNOSIS — H903 Sensorineural hearing loss, bilateral: Secondary | ICD-10-CM | POA: Diagnosis not present

## 2020-02-05 LAB — COMPLETE METABOLIC PANEL WITH GFR
AG Ratio: 1.6 (calc) (ref 1.0–2.5)
ALT: 30 U/L (ref 9–46)
AST: 26 U/L (ref 10–35)
Albumin: 4.1 g/dL (ref 3.6–5.1)
Alkaline phosphatase (APISO): 69 U/L (ref 35–144)
BUN: 20 mg/dL (ref 7–25)
CO2: 31 mmol/L (ref 20–32)
Calcium: 9.5 mg/dL (ref 8.6–10.3)
Chloride: 106 mmol/L (ref 98–110)
Creat: 1.02 mg/dL (ref 0.70–1.11)
GFR, Est African American: 79 mL/min/{1.73_m2} (ref >=60)
GFR, Est Non African American: 68 mL/min/{1.73_m2} (ref >=60)
Globulin: 2.6 g/dL (calc) (ref 1.9–3.7)
Glucose, Bld: 102 mg/dL — ABNORMAL HIGH (ref 65–99)
Potassium: 4.8 mmol/L (ref 3.5–5.3)
Sodium: 142 mmol/L (ref 135–146)
Total Bilirubin: 0.5 mg/dL (ref 0.2–1.2)
Total Protein: 6.7 g/dL (ref 6.1–8.1)

## 2020-02-05 LAB — CBC WITH DIFFERENTIAL/PLATELET
Absolute Monocytes: 640 cells/uL (ref 200–950)
Basophils Absolute: 82 cells/uL (ref 0–200)
Basophils Relative: 1 %
Eosinophils Absolute: 377 cells/uL (ref 15–500)
Eosinophils Relative: 4.6 %
HCT: 47.3 % (ref 38.5–50.0)
Hemoglobin: 15.7 g/dL (ref 13.2–17.1)
Lymphs Abs: 1517 cells/uL (ref 850–3900)
MCH: 30.3 pg (ref 27.0–33.0)
MCHC: 33.2 g/dL (ref 32.0–36.0)
MCV: 91.1 fL (ref 80.0–100.0)
MPV: 10.3 fL (ref 7.5–12.5)
Monocytes Relative: 7.8 %
Neutro Abs: 5584 cells/uL (ref 1500–7800)
Neutrophils Relative %: 68.1 %
Platelets: 266 10*3/uL (ref 140–400)
RBC: 5.19 10*6/uL (ref 4.20–5.80)
RDW: 12.3 % (ref 11.0–15.0)
Total Lymphocyte: 18.5 %
WBC: 8.2 10*3/uL (ref 3.8–10.8)

## 2020-02-05 LAB — LIPID PANEL
Cholesterol: 143 mg/dL (ref <200)
HDL: 40 mg/dL (ref >=40)
LDL Cholesterol (Calc): 78 mg/dL (calc)
Non-HDL Cholesterol (Calc): 103 mg/dL (calc) (ref <130)
Total CHOL/HDL Ratio: 3.6 (calc) (ref <5.0)
Triglycerides: 146 mg/dL (ref <150)

## 2020-02-05 LAB — MAGNESIUM: Magnesium: 2.5 mg/dL (ref 1.5–2.5)

## 2020-02-05 LAB — TSH: TSH: 3.16 mIU/L (ref 0.40–4.50)

## 2020-02-07 ENCOUNTER — Ambulatory Visit: Payer: Medicare Other | Attending: Internal Medicine

## 2020-02-07 DIAGNOSIS — Z23 Encounter for immunization: Secondary | ICD-10-CM | POA: Insufficient documentation

## 2020-02-07 NOTE — Progress Notes (Signed)
   Covid-19 Vaccination Clinic  Name:  [name]    MRN: [id number] DOB: 02/18/1936  02/07/2020  Brian Proctor was observed post Covid-19 immunization for 15 minutes without incidence. He was provided with Vaccine Information Sheet and instruction to access the V-Safe system.   Brian Proctor was instructed to call 911 with any severe reactions post vaccine: [name] Difficulty breathing  . Swelling of your face and throat  . A fast heartbeat  . A bad rash all over your body  . Dizziness and weakness    Immunizations Administered    Name Date Dose VIS Date Route   Pfizer COVID-19 Vaccine 02/07/2020 10:49 AM 0.3 mL 12/07/2019 Intramuscular   Manufacturer: [place]   Lot: [id number]   [place]: [id number]

## 2020-02-13 DIAGNOSIS — L570 Actinic keratosis: Secondary | ICD-10-CM | POA: Diagnosis not present

## 2020-02-13 DIAGNOSIS — Z85828 Personal history of other malignant neoplasm of skin: Secondary | ICD-10-CM | POA: Diagnosis not present

## 2020-02-18 DIAGNOSIS — H2513 Age-related nuclear cataract, bilateral: Secondary | ICD-10-CM | POA: Diagnosis not present

## 2020-03-11 ENCOUNTER — Ambulatory Visit (INDEPENDENT_AMBULATORY_CARE_PROVIDER_SITE_OTHER): Payer: Medicare Other | Admitting: Neurology

## 2020-03-11 ENCOUNTER — Other Ambulatory Visit: Payer: Self-pay

## 2020-03-11 ENCOUNTER — Encounter: Payer: Self-pay | Admitting: Neurology

## 2020-03-11 VITALS — BP 116/70 | HR 76 | Temp 98.6°F | Ht 69.5 in | Wt 157.5 lb

## 2020-03-11 DIAGNOSIS — G3184 Mild cognitive impairment, so stated: Secondary | ICD-10-CM | POA: Diagnosis not present

## 2020-03-11 MED ORDER — MEMANTINE HCL 10 MG PO TABS: 10.0000 mg | ORAL_TABLET | Freq: Two times a day (BID) | ORAL | 11 refills | Status: DC

## 2020-03-11 NOTE — Progress Notes (Signed)
PATIENT: Brian Proctor DOB: 12-14-1936  Chief Complaint  Patient presents with  . Memory Loss    MMSE 24/30 - 5 animals. Reports slow, decline of memory.   [name] PCP    Unk Pinto, MD     HISTORICAL  Brian Proctor is a 84 year old male, seen in request by his primary care physician Dr. Unk Pinto for evaluation of memory loss, initial evaluation was on March 11, 2020.  He drove himself to clinic today, relied on his GPS  I have reviewed and summarized the referring note from the referring physician.  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, lives with his son and wife at home, enjoys playing golf, reading infections, has good appetite, sleeps well, denies family history of dementia  Over the past couple years, he had a gradual onset of memory loss, tends to forget conversations, today's Mini-Mental Status Examination 24 out of 30, he missed 2 out of 3 recalls, he is not oriented to time  I personally reviewed MRI of the brain without contrast in August 2020, generalized atrophy, mild supratentorium small vessel disease  Laboratory evaluation in February 2021, normal TSH, lipid panel, magnesium, CBC, vitamin D, A1c, TSH, CMP, B12 was 812 in July 2020  REVIEW OF SYSTEMS: Full 14 system review of systems performed and notable only for as above All other review of systems were negative.  ALLERGIES: No Known Allergies  HOME MEDICATIONS: Current Outpatient Medications  Medication Sig Dispense Refill  . Ascorbic Acid (VITAMIN C PO) Take by mouth daily.    [name] aspirin 81 MG tablet Take 81 mg by mouth daily.    [name] atorvastatin (LIPITOR) 40 MG tablet TAKE 1 TABLET BY MOUTH DAILY 90 tablet 1  . Cholecalciferol (VITAMIN D PO) Take by mouth daily.    [name] CINNAMON PO Take 1,000 mg by mouth daily.    . Coenzyme Q10 200 MG TABS Take 200 mg by mouth daily.    . fish oil-omega-3 fatty acids 1000 MG capsule Take 2 g by mouth daily.      . Flaxseed, Linseed, (FLAXSEED OIL PO) Take 1,000 mg by mouth daily.    . hydrocortisone (PROCTO-MED HC) 2.5 % rectal cream User 3 to 4 x / day as needed 30 g 11  . hydrocortisone-pramoxine (ANALPRAM-HC) 2.5-1 % rectal cream Apply rectally 1-3 times daily as needed for hemorrhoid flare 30 g 2  . Magnesium 250 MG TABS Take 250 mg by mouth daily.    . Multiple Vitamins-Minerals (MULTIVITAMIN PO) Take by mouth daily.    . Multiple Vitamins-Minerals (ZINC PO) Take by mouth.    . pseudoephedrine (SUDAFED) 30 MG tablet Take 30 mg by mouth every 4 (four) hours as needed for congestion.    . triamcinolone ointment (KENALOG) 0.1 % Apply 1 application topically 2 (two) times daily. 80 g 1  . vitamin C (ASCORBIC ACID) 500 MG tablet Take 500 mg by mouth daily.     No current facility-administered medications for this visit.    PAST MEDICAL HISTORY: Past Medical History:  Diagnosis Date  . Cancer [facility])    prostate - basal cell  . GERD (gastroesophageal reflux disease)   . Hearing loss    wear hearing aid  . Hyperlipidemia   . Hypertension   . Inguinal hernia   . Memory loss   . Prediabetes   . Vitamin D deficiency   . Wears glasses   . Wears hearing  aid    both ears    PAST SURGICAL HISTORY: Past Surgical History:  Procedure Laterality Date  . COLONOSCOPY     x2  . INGUINAL HERNIA REPAIR  01/15/2013   Procedure: HERNIA REPAIR INGUINAL ADULT;  Surgeon: Gwenyth Ober, MD;  Location: [facility];  Service: General;  Laterality: Right;  . Boomer or 2000   due to cancer  . TONSILLECTOMY     as a child    FAMILY HISTORY: Family History  Problem Relation Age of Onset  . Other Mother        bowel infarction  . COPD Father   . Heart disease Father   . Autism Son     SOCIAL HISTORY: Social History   Socioeconomic History  . Marital status: Married    Spouse name: Vaughan Basta  . Number of children: 2  . Years of education: college  . Highest education  level: Not on file  Occupational History  . Occupation: Retired  Tobacco Use  . Smoking status: Former Smoker    Packs/day: 1.00    Years: 40.00    Pack years: 40.00    Quit date: 01/10/1999    Years since quitting: 21.1  . Smokeless tobacco: Never Used  Substance and Sexual Activity  . Alcohol use: No    Comment: occasional  . Drug use: No  . Sexual activity: Not on file  Other Topics Concern  . Not on file  Social History Narrative   Right handed    Caffeine use: coffee daily   Lives with wife, Vaughan Basta and his son Brian Proctor)   Social Determinants of Health   Financial Resource Strain:   . Difficulty of Paying Living Expenses:   Food Insecurity:   . Worried About Charity fundraiser in the Last Year:   . Arboriculturist in the Last Year:   Transportation Needs:   . Film/video editor (Medical):   [name] Lack of Transportation (Non-Medical):   Physical Activity:   . Days of Exercise per Week:   . Minutes of Exercise per Session:   Stress:   . Feeling of Stress :   Social Connections:   . Frequency of Communication with Friends and Family:   . Frequency of Social Gatherings with Friends and Family:   . Attends Religious Services:   . Active Member of Clubs or Organizations:   . Attends Archivist Meetings:   [name] Marital Status:   Intimate Partner Violence:   . Fear of Current or Ex-Partner:   . Emotionally Abused:   [name] Physically Abused:   . Sexually Abused:      PHYSICAL EXAM   Vitals:   03/11/20 0830  BP: 116/70  Pulse: 76  Temp: 98.6 F (37 C)  SpO2: 97%  Weight: 157 lb 8 oz (71.4 kg)  Height: 5' 9.5" (1.765 m)    Not recorded      Body mass index is 22.93 kg/m.  PHYSICAL EXAMNIATION:  Gen: NAD, conversant, well nourised, well groomed                     Cardiovascular: Regular rate rhythm, no peripheral edema, warm, nontender. Eyes: Conjunctivae clear without exudates or hemorrhage Neck: Supple, no carotid bruits. Pulmonary: Clear to  auscultation bilaterally   NEUROLOGICAL EXAM:  MENTAL STATUS: MMSE - Mini Mental State Exam 03/11/2020  Orientation to time 1  Orientation to Place 5  Registration 3  Attention/ Calculation 5  Recall 1  Language- name 2 objects 2  Language- repeat 1  Language- follow 3 step command 3  Language- read & follow direction 1  Write a sentence 1  Copy design 1  Total score 24  Animal naming 5 CRANIAL NERVES: CN II: Visual fields are full to confrontation. Pupils are round equal and briskly reactive to light. CN III, IV, VI: extraocular movement are normal. No ptosis. CN V: Facial sensation is intact to light touch CN VII: Face is symmetric with normal eye closure  CN VIII: Hearing is normal to causal conversation. CN IX, X: Phonation is normal. CN XI: Head turning and shoulder shrug are intact  MOTOR: There is no pronator drift of out-stretched arms. Muscle bulk and tone are normal. Muscle strength is normal.  REFLEXES: Reflexes are 2+ and symmetric at the biceps, triceps, knees, and ankles. Plantar responses are flexor.  SENSORY: Intact to light touch, pinprick and vibratory sensation are intact in fingers and toes.  COORDINATION: There is no trunk or limb dysmetria noted.  GAIT/STANCE: Posture is normal. Gait is steady with normal steps, base, arm swing, and turning. Heel and toe walking are normal. Tandem gait is normal.  Romberg is absent.   DIAGNOSTIC DATA (LABS, IMAGING, TESTING) - I reviewed patient records, labs, notes, testing and imaging myself where available.   ASSESSMENT AND PLAN  AREK OLIVAR is a 84 y.o. male   Mild cognitive impairment  Mini-Mental Status Examination 24/30  Most likely central nervous system degenerative disorder,  Laboratory evaluation showed no treatable etiology  After discussed with patient, decided to start Namenda 10 mg twice a day,  Encouraged him moderate exercise  Return to clinic with nurse practitioner Sarah in 6  months   Marcial Pacas, M.D. Ph.D.  [facility] [address] [place], [place] [address] Ph: [phone] Fax: [phone]  CC: Referring Provider

## 2020-03-24 ENCOUNTER — Telehealth: Payer: Self-pay | Admitting: Neurology

## 2020-03-24 NOTE — Telephone Encounter (Signed)
Patient wife LVM in regards to medication patient was prescribed from last visit states the medication is not working and they are not seeing good results and would like to know what other options are available

## 2020-03-24 NOTE — Telephone Encounter (Signed)
I returned the call to the patient's wife who feels like his confusion has worsened since starting memantine 10mg  BID two weeks ago. She is going to hold the medication to see if his symptoms improve. She will call our office back with an update in a couple of weeks. I have also provided this information to Dr. Krista Blue and she was in agreement with him holding the medication.

## 2020-04-30 ENCOUNTER — Encounter: Payer: Self-pay | Admitting: Internal Medicine

## 2020-05-05 ENCOUNTER — Ambulatory Visit: Payer: Medicare Other | Admitting: Internal Medicine

## 2020-05-08 ENCOUNTER — Ambulatory Visit: Payer: Medicare Other | Admitting: Internal Medicine

## 2020-05-13 ENCOUNTER — Ambulatory Visit (INDEPENDENT_AMBULATORY_CARE_PROVIDER_SITE_OTHER): Payer: Medicare Other | Admitting: Adult Health Nurse Practitioner

## 2020-05-13 ENCOUNTER — Encounter: Payer: Self-pay | Admitting: Adult Health Nurse Practitioner

## 2020-05-13 ENCOUNTER — Other Ambulatory Visit: Payer: Self-pay

## 2020-05-13 VITALS — BP 110/64 | HR 60 | Temp 97.6°F | Wt 157.8 lb

## 2020-05-13 DIAGNOSIS — K219 Gastro-esophageal reflux disease without esophagitis: Secondary | ICD-10-CM

## 2020-05-13 DIAGNOSIS — Z6822 Body mass index (BMI) 22.0-22.9, adult: Secondary | ICD-10-CM | POA: Diagnosis not present

## 2020-05-13 DIAGNOSIS — J449 Chronic obstructive pulmonary disease, unspecified: Secondary | ICD-10-CM

## 2020-05-13 DIAGNOSIS — Z79899 Other long term (current) drug therapy: Secondary | ICD-10-CM

## 2020-05-13 DIAGNOSIS — E559 Vitamin D deficiency, unspecified: Secondary | ICD-10-CM

## 2020-05-13 DIAGNOSIS — I10 Essential (primary) hypertension: Secondary | ICD-10-CM

## 2020-05-13 DIAGNOSIS — E782 Mixed hyperlipidemia: Secondary | ICD-10-CM

## 2020-05-13 DIAGNOSIS — G3184 Mild cognitive impairment, so stated: Secondary | ICD-10-CM | POA: Diagnosis not present

## 2020-05-13 LAB — CBC WITH DIFFERENTIAL/PLATELET
Absolute Monocytes: 893 cells/uL (ref 200–950)
Basophils Absolute: 60 cells/uL (ref 0–200)
Basophils Relative: 0.7 %
Eosinophils Absolute: 332 cells/uL (ref 15–500)
Eosinophils Relative: 3.9 %
HCT: 46.2 % (ref 38.5–50.0)
Hemoglobin: 15.3 g/dL (ref 13.2–17.1)
Lymphs Abs: 1777 cells/uL (ref 850–3900)
MCH: 30.8 pg (ref 27.0–33.0)
MCHC: 33.1 g/dL (ref 32.0–36.0)
MCV: 93 fL (ref 80.0–100.0)
MPV: 10.6 fL (ref 7.5–12.5)
Monocytes Relative: 10.5 %
Neutro Abs: 5440 cells/uL (ref 1500–7800)
Neutrophils Relative %: 64 %
Platelets: 232 10*3/uL (ref 140–400)
RBC: 4.97 10*6/uL (ref 4.20–5.80)
RDW: 12.4 % (ref 11.0–15.0)
Total Lymphocyte: 20.9 %
WBC: 8.5 10*3/uL (ref 3.8–10.8)

## 2020-05-13 LAB — COMPLETE METABOLIC PANEL WITH GFR
AG Ratio: 1.6 (calc) (ref 1.0–2.5)
ALT: 25 U/L (ref 9–46)
AST: 24 U/L (ref 10–35)
Albumin: 4 g/dL (ref 3.6–5.1)
Alkaline phosphatase (APISO): 71 U/L (ref 35–144)
BUN: 17 mg/dL (ref 7–25)
CO2: 32 mmol/L (ref 20–32)
Calcium: 9.4 mg/dL (ref 8.6–10.3)
Chloride: 104 mmol/L (ref 98–110)
Creat: 1.11 mg/dL (ref 0.70–1.11)
GFR, Est African American: 71 mL/min/{1.73_m2} (ref >=60)
GFR, Est Non African American: 62 mL/min/{1.73_m2} (ref >=60)
Globulin: 2.5 g/dL (calc) (ref 1.9–3.7)
Glucose, Bld: 73 mg/dL (ref 65–99)
Potassium: 5.3 mmol/L (ref 3.5–5.3)
Sodium: 140 mmol/L (ref 135–146)
Total Bilirubin: 0.5 mg/dL (ref 0.2–1.2)
Total Protein: 6.5 g/dL (ref 6.1–8.1)

## 2020-05-13 LAB — LIPID PANEL
Cholesterol: 149 mg/dL (ref <200)
HDL: 40 mg/dL (ref >=40)
LDL Cholesterol (Calc): 80 mg/dL (calc)
Non-HDL Cholesterol (Calc): 109 mg/dL (calc) (ref <130)
Total CHOL/HDL Ratio: 3.7 (calc) (ref <5.0)
Triglycerides: 197 mg/dL — ABNORMAL HIGH (ref <150)

## 2020-05-13 NOTE — Progress Notes (Signed)
FOLLOW UP 3 MONTH Assessment:   Diagnoses and all orders for this visit:  Essential hypertension At goal, no medications at this time Monitor blood pressure at home; call if consistently over 130/80 Continue DASH diet.   Reminder to go to the ER if any CP, SOB, nausea, dizziness, severe HA, changes vision/speech, left arm numbness and tingling and jaw pain.  Gastroesophageal reflux disease, esophagitis presence not specified Currently well controlled with lifestyle changes - avoiding triggers, excessively large meals, lying down after eating OTC agents for rare symptoms; contact office if frequency increases  History of prostate cancer S/p prostatectomy 1999; no issues or suspected recurrence.  Wears pad for incontinence and doing well  Continue to monitor.   Mixed hyperlipidemia Continue medications: Atorvastatin 40mg  daily, CoQ10 200mg  daily, Fish oil 1,000mg  Continue medications and supplements for LDL goal <100 Continue low cholesterol diet and exercise.  Check lipid panel.   History of elevated glucose Previously prediabetic currently well controlled with lifestyle changes Encouraged to continue with lifestyle Monitor A1C as needed; defer today, check CMP  Vitamin D deficiency Continue supplementation and routine monitoring At goal at recent check; continue to recommend supplementation for goal of 60-100 Defer vitamin D level  History of basal cell carcinoma Continue follow up with derm q6 months or as recommended; wear sunscreen when outdoors and reapply regularly.  Body mass index (BMI) of 22.0-22.9 in adult Maintain weight with balanced diet and exercise  Former smoker/40+ pack year hx No sx, last cxr 2019, no sx, declines imaging at this time  Memory changes Had normal basic labs, smoker 40+ pack year, had MRI suggestive of microvascular changes Declined namenda/aricept, discussed today, patient would like to discuss with neurology Continue ASA, statin     Over 30 minutes of face to face interview, exam, counseling, chart review, and critical decision making was performed  Future Appointments  Date Time Provider [place]  08/14/2020  9:30 AM Garnet Sierras, NP [facility] None  09/11/2020  9:45 AM Suzzanne Cloud, NP [facility] None  11/25/2020  9:00 AM Unk Pinto, MD [facility] None  02/25/2021  9:00 AM Liane Comber, NP [facility] None     HPI:  Brian Proctor is a 84 y.o. male who presents 3 month follow up for HTN controlled by lifestyle modifications, HLD, history prediabetes, and vitamin D Def.   S/p prostatectomy for CA in 1999 - has incontinence for which he wears a pad.   He has hx of basal cell skin CA for which he sees dermatology q47m for monitoring.   GERD well controlled by lifestyle modification; uses alka-seltzer for rare symptoms, has used once since last OV.  He has reported ongoing gradual short term memory loss, had MRI in 06/2019 for these reports which showed microvascular changes. Declined namenda/aricept at that time, had basic labs at that time including b12 which were normal, today repots interest in seeing a neurologist to discuss further.  Reports his wife manages his medications for him.  He is not sure about what medications that he takes.  Today he reports overall he feels well and has no health or medication concerns.  He remains active and plays golf three days a week.  He uses the cart but also walk as well.  On the days he does not play golf he reports he walk over a mile.  BMI is Body mass index is 22.97 kg/m., he has been working on diet and exercise. His various medical conditions are very well controlled -  most managed by lifestyle modification. He continues to walk 60 min 3-4 times a week without issues, golfs and does regular yard work.  Wt Readings from Last 3 Encounters:  05/13/20 157 lb 12.8 oz (71.6 kg)  03/11/20 157 lb 8 oz (71.4 kg)  02/04/20 161 lb 9.6 oz (73.3 kg)   His blood  pressure has been controlled at home, today their BP is BP: 110/64 He does workout. He denies chest pain, shortness of breath, dizziness.   He is on cholesterol medication (atorvastatatin 40 mg daily), on omega 3 supplement and lifestyle changes. His cholesterol is at goal. The cholesterol last visit was:  Lab Results  Component Value Date   CHOL 143 02/04/2020   HDL 40 02/04/2020   LDLCALC 78 02/04/2020   TRIG 146 02/04/2020   CHOLHDL 3.6 02/04/2020   He has been working on diet and exercise for history of prediabetes, and denies increased appetite, nausea, paresthesia of the feet, polydipsia, polyuria and visual disturbances. Last A1C in the office was:  Lab Results  Component Value Date   HGBA1C 5.4 10/24/2019   Last GFR Lab Results  Component Value Date   GFRNONAA 68 02/04/2020    Patient is on Vitamin D supplement.   Lab Results  Component Value Date   VD25OH 69 10/24/2019      Medication Review: Current Outpatient Medications on File Prior to Visit  Medication Sig Dispense Refill  . Ascorbic Acid (VITAMIN C PO) Take by mouth daily.    [name] aspirin 81 MG tablet Take 81 mg by mouth daily.    [name] atorvastatin (LIPITOR) 40 MG tablet TAKE 1 TABLET BY MOUTH DAILY 90 tablet 1  . Cholecalciferol (VITAMIN D PO) Take by mouth daily.    [name] CINNAMON PO Take 1,000 mg by mouth daily.    . Coenzyme Q10 200 MG TABS Take 200 mg by mouth daily.    . fish oil-omega-3 fatty acids 1000 MG capsule Take 2 g by mouth daily.    . Flaxseed, Linseed, (FLAXSEED OIL PO) Take 1,000 mg by mouth daily.    . hydrocortisone (PROCTO-MED HC) 2.5 % rectal cream User 3 to 4 x / day as needed 30 g 11  . hydrocortisone-pramoxine (ANALPRAM-HC) 2.5-1 % rectal cream Apply rectally 1-3 times daily as needed for hemorrhoid flare 30 g 2  . Magnesium 250 MG TABS Take 250 mg by mouth daily.    . memantine (NAMENDA) 10 MG tablet Take 1 tablet (10 mg total) by mouth 2 (two) times daily. 60 tablet 11  . Multiple  Vitamins-Minerals (MULTIVITAMIN PO) Take by mouth daily.    . Multiple Vitamins-Minerals (ZINC PO) Take by mouth.    . pseudoephedrine (SUDAFED) 30 MG tablet Take 30 mg by mouth every 4 (four) hours as needed for congestion.    . triamcinolone ointment (KENALOG) 0.1 % Apply 1 application topically 2 (two) times daily. 80 g 1  . vitamin C (ASCORBIC ACID) 500 MG tablet Take 500 mg by mouth daily.     No current facility-administered medications on file prior to visit.    Allergies: No Known Allergies  Current Problems (verified) has Hyperlipidemia, mixed; Essential hypertension; GERD (gastroesophageal reflux disease); Vitamin D deficiency; Medication management; History of prostate cancer; Encounter for Medicare annual wellness exam; BMI 22.0-22.9, adult; History of basal cell carcinoma; Former smoker; FH: heart disease; COPD (chronic obstructive pulmonary disease) ([place]); Abnormal glucose; Memory changes; and Mild cognitive impairment on their problem list.  Screening Tests Immunization History  Administered Date(s) Administered  . DT (Pediatric) 09/03/2015  . Fluad Quad(high Dose 65+) 10/13/2019  . Influenza Inj Mdck Quad Pf 11/10/2018  . Influenza Split 10/28/2016  . Influenza, High Dose Seasonal PF 09/27/2014, 09/03/2015, 10/05/2017  . Influenza,inj,Quad PF,6+ Mos 10/11/2018  . PFIZER SARS-COV-2 Vaccination 01/17/2020, 02/07/2020  . Pneumococcal Conjugate-13 10/16/2014  . Pneumococcal-Unspecified 02/03/2011  . Td 07/26/2005, 08/07/2018  . Zoster 12/27/2006   Preventative care: Last colonoscopy: 2015  CXR 2019 MRI brain 07/2019 microvascular changes  Prior vaccinations: TD or Tdap: 2016  Influenza: 2020 Pneumococcal: 2012 Prevnar13: 2015 Shingles/Zostavax: 2008 SARS-COV2- complete 02/07/20  Names of Other Physician/Practitioners you currently use: 1. [facility] here for primary care 2. [facility], eye doctor, last visit 2016, wears  glasses, no issues, due 3. [facility], dentist, last visit 2021 - goes Q6 months  Sees dermatology Q6 months   Patient Care Team: Unk Pinto, MD as PCP - General (Internal Medicine)  Surgical: He  has a past surgical history that includes Tonsillectomy; Prostate surgery (1999 or 2000); Colonoscopy; and Inguinal hernia repair (01/15/2013). Family His family history includes Autism in his son; COPD in his father; Heart disease in his father; Other in his mother. Social history  He reports that he quit smoking about 21 years ago. He has a 40.00 pack-year smoking history. He has never used smokeless tobacco. He reports that he does not drink alcohol or use drugs.   :  Objective:   Today's Vitals   05/13/20 0855  BP: 110/64  Pulse: 60  Temp: 97.6 F (36.4 C)  SpO2: 96%  Weight: 157 lb 12.8 oz (71.6 kg)   Body mass index is 22.97 kg/m.  General appearance: alert, no distress, WD/WN, male HEENT: normocephalic, sclerae anicteric, TMs pearly, nares patent, no discharge or erythema, pharynx normal Oral cavity: MMM, no lesions Neck: supple, no lymphadenopathy, no thyromegaly, no masses Heart: RRR, normal S1, S2, no murmurs Lungs: CTA bilaterally, no wheezes, rhonchi, or rales Abdomen: +bs, soft, non tender, non distended, no masses, no hepatomegaly, no splenomegaly Musculoskeletal: nontender, no swelling, no obvious deformity Extremities: no edema, no cyanosis, no clubbing Pulses: 2+ symmetric, upper and lower extremities, normal cap refill Neurological: alert, oriented x 3, CN2-12 intact, strength normal upper extremities and lower extremities, sensation normal throughout, DTRs 2+ throughout, no cerebellar signs, gait normal. Poor short term recall.  Psychiatric: normal affect, behavior normal, pleasant  Skin: warm/dry, intact, no rashes    Garnet Sierras, NP [facility] 05/13/2020  9:51 AM

## 2020-08-11 NOTE — Progress Notes (Signed)
FOLLOW UP 3 MONTH Assessment:   Diagnoses and all orders for this visit:  Essential hypertension At goal, no medications at this time Monitor blood pressure at home; call if consistently over 130/80 Continue DASH diet.   Reminder to go to the ER if any CP, SOB, nausea, dizziness, severe HA, changes vision/speech, left arm numbness and tingling and jaw pain.  Mixed hyperlipidemia Continue medications: Atorvastatin 40mg  daily, CoQ10 200mg  daily, Fish oil 1,000mg  Continue medications and supplements for LDL goal <100 Continue low cholesterol diet and exercise.  Check lipid panel.   History of elevated glucose Previously prediabetic currently well controlled with lifestyle changes Encouraged to continue with lifestyle Monitor A1C as needed; defer today, check CMP  Vitamin D deficiency Continue supplementation and routine monitoring At goal at recent check; continue to recommend supplementation for goal of 60-100 Defer vitamin D level  Body mass index (BMI) of 22.0-22.9 in adult Maintain weight with balanced diet and exercise  Memory changes/mild cognitive impairment Vascular dementia ([place])  Had normal basic labs, smoker 40+ pack year, had MRI suggestive of microvascular changes Saw [facility] and now on Namenda. He is on bASA, high dose statin. Discussed regular exercise/activity which he is doing very well with.   Over 30 minutes of face to face interview, exam, counseling, chart review, and critical decision making was performed  Future Appointments  Date Time Provider [place]  09/11/2020  9:45 AM Suzzanne Cloud, NP [facility] None  11/25/2020  9:00 AM Unk Pinto, MD [facility] None  02/25/2021  9:00 AM Liane Comber, NP [facility] None     HPI:  Brian Proctor is a 84 y.o. male who presents 3 month follow up for HTN controlled by lifestyle modifications, HLD, history prediabetes, and vitamin D Def. S/p prostatectomy for CA in 1999 - has incontinence for which he  wears a pad.    He has gradual short term memory loss, MRI in 06/2019 which showed microvascular changes, had normal labs. Saw Dr. Krista Blue in 02/2020 who initiated namenda 10 mg BID. He is not sure if he has noted any changes with this. Reports his wife manages his medications for him. Today he reports overall he feels well and has no health or medication concerns.   BMI is Body mass index is 22.12 kg/m., he has been working on diet and exercise. His various medical conditions are very well controlled - most managed by lifestyle modification. He continues to walk 60 min 3-4 times a week without issues, golfs and does regular yard work.  Wt Readings from Last 3 Encounters:  08/14/20 152 lb (68.9 kg)  05/13/20 157 lb 12.8 oz (71.6 kg)  03/11/20 157 lb 8 oz (71.4 kg)   Has 40 pack year smoking hx; has COPD type hyperinflation per CXR in 2019; denies associated sx.   His blood pressure has been controlled at home, today their BP is BP: 102/68 He does workout. He denies chest pain, shortness of breath, dizziness.   He is on cholesterol medication (atorvastatatin 40 mg daily), on omega 3 supplement and lifestyle changes. His cholesterol is at goal. The cholesterol last visit was:  Lab Results  Component Value Date   CHOL 149 05/13/2020   HDL 40 05/13/2020   LDLCALC 80 05/13/2020   TRIG 197 (H) 05/13/2020   CHOLHDL 3.7 05/13/2020   He has been working on diet and exercise for history of prediabetes, and denies increased appetite, nausea, paresthesia of the feet, polydipsia, polyuria and visual disturbances. Last A1C  in the office was:  Lab Results  Component Value Date   HGBA1C 5.4 10/24/2019   Last GFR Lab Results  Component Value Date   GFRNONAA 62 05/13/2020    Patient is on Vitamin D supplement.   Lab Results  Component Value Date   VD25OH 69 10/24/2019      Medication Review: Current Outpatient Medications on File Prior to Visit  Medication Sig Dispense Refill  . Ascorbic Acid  (VITAMIN C PO) Take by mouth daily.    [name] aspirin 81 MG tablet Take 81 mg by mouth daily.    [name] atorvastatin (LIPITOR) 40 MG tablet TAKE 1 TABLET BY MOUTH DAILY 90 tablet 1  . Cholecalciferol (VITAMIN D PO) Take by mouth daily.    [name] CINNAMON PO Take 1,000 mg by mouth daily.    . Coenzyme Q10 200 MG TABS Take 200 mg by mouth daily.    . fish oil-omega-3 fatty acids 1000 MG capsule Take 2 g by mouth daily.    . Flaxseed, Linseed, (FLAXSEED OIL PO) Take 1,000 mg by mouth daily.    . hydrocortisone (PROCTO-MED HC) 2.5 % rectal cream User 3 to 4 x / day as needed 30 g 11  . hydrocortisone-pramoxine (ANALPRAM-HC) 2.5-1 % rectal cream Apply rectally 1-3 times daily as needed for hemorrhoid flare 30 g 2  . Magnesium 250 MG TABS Take 250 mg by mouth daily.    . memantine (NAMENDA) 10 MG tablet Take 1 tablet (10 mg total) by mouth 2 (two) times daily. 60 tablet 11  . Multiple Vitamins-Minerals (MULTIVITAMIN PO) Take by mouth daily.    . Multiple Vitamins-Minerals (ZINC PO) Take by mouth.    . pseudoephedrine (SUDAFED) 30 MG tablet Take 30 mg by mouth every 4 (four) hours as needed for congestion.    . triamcinolone ointment (KENALOG) 0.1 % Apply 1 application topically 2 (two) times daily. 80 g 1  . vitamin C (ASCORBIC ACID) 500 MG tablet Take 500 mg by mouth daily.     No current facility-administered medications on file prior to visit.    Allergies: No Known Allergies  Current Problems (verified) has Hyperlipidemia, mixed; Essential hypertension; GERD (gastroesophageal reflux disease); Vitamin D deficiency; Medication management; History of prostate cancer; Encounter for Medicare annual wellness exam; BMI 22.0-22.9, adult; History of basal cell carcinoma; Former smoker; FH: heart disease; COPD (chronic obstructive pulmonary disease) ([place]); Abnormal glucose; Memory changes; and Mild cognitive impairment on their problem list.  Patient Care Team: Unk Pinto, MD as PCP - General (Internal  Medicine)  Surgical: He  has a past surgical history that includes Tonsillectomy; Prostate surgery (1999 or 2000); Colonoscopy; and Inguinal hernia repair (01/15/2013). Family His family history includes Autism in his son; COPD in his father; Heart disease in his father; Other in his mother. Social history  He reports that he quit smoking about 21 years ago. He has a 40.00 pack-year smoking history. He has never used smokeless tobacco. He reports that he does not drink alcohol and does not use drugs.   Review of Systems  Constitutional: Negative for malaise/fatigue and weight loss.  HENT: Negative for hearing loss and tinnitus.   Eyes: Negative for blurred vision and double vision.  Respiratory: Negative for cough, shortness of breath and wheezing.   Cardiovascular: Negative for chest pain, palpitations, orthopnea, claudication and leg swelling.  Gastrointestinal: Negative for abdominal pain, blood in stool, constipation, diarrhea, heartburn, melena, nausea and vomiting.  Genitourinary: Negative.   Musculoskeletal: Negative for joint  pain and myalgias.  Skin: Negative for rash.  Neurological: Negative for dizziness, tingling, sensory change, weakness and headaches.  Endo/Heme/Allergies: Negative for polydipsia.  Psychiatric/Behavioral: Positive for memory loss. Negative for depression, hallucinations and substance abuse. The patient is not nervous/anxious.   All other systems reviewed and are negative.    Objective:   Today's Vitals   08/14/20 0922  BP: 102/68  Pulse: 60  Temp: (!) 97.3 F (36.3 C)  SpO2: 97%  Weight: 152 lb (68.9 kg)   Body mass index is 22.12 kg/m.  General appearance: alert, no distress, WD/WN, male HEENT: normocephalic, sclerae anicteric, TMs pearly, nares patent, no discharge or erythema, pharynx normal. Fairly HOH despite L hearing aid.  Oral cavity: MMM, no lesions Neck: supple, no lymphadenopathy, no thyromegaly, no masses Heart: RRR, normal S1, S2,  no murmurs Lungs: CTA bilaterally, no wheezes, rhonchi, or rales Abdomen: +bs, soft, non tender, non distended, no masses, no hepatomegaly, no splenomegaly Musculoskeletal: nontender, no swelling, no obvious deformity Extremities: no edema, no cyanosis, no clubbing Pulses: 2+ symmetric, upper and lower extremities, normal cap refill Neurological: alert, oriented x 3, CN2-12 intact, strength normal upper extremities and lower extremities, sensation normal throughout, DTRs 2+ throughout, no cerebellar signs, gait normal. Poor short term recall.  Psychiatric: normal affect, behavior normal, pleasant  Skin: warm/dry, intact, no rashes   Izora Ribas, NP 9:39 AM [facility]

## 2020-08-14 ENCOUNTER — Other Ambulatory Visit: Payer: Self-pay

## 2020-08-14 ENCOUNTER — Encounter: Payer: Self-pay | Admitting: Adult Health

## 2020-08-14 ENCOUNTER — Ambulatory Visit (INDEPENDENT_AMBULATORY_CARE_PROVIDER_SITE_OTHER): Payer: Medicare Other | Admitting: Adult Health

## 2020-08-14 VITALS — BP 102/68 | HR 60 | Temp 97.3°F | Wt 152.0 lb

## 2020-08-14 DIAGNOSIS — R413 Other amnesia: Secondary | ICD-10-CM | POA: Diagnosis not present

## 2020-08-14 DIAGNOSIS — J449 Chronic obstructive pulmonary disease, unspecified: Secondary | ICD-10-CM | POA: Diagnosis not present

## 2020-08-14 DIAGNOSIS — E559 Vitamin D deficiency, unspecified: Secondary | ICD-10-CM | POA: Diagnosis not present

## 2020-08-14 DIAGNOSIS — Z6822 Body mass index (BMI) 22.0-22.9, adult: Secondary | ICD-10-CM

## 2020-08-14 DIAGNOSIS — Z79899 Other long term (current) drug therapy: Secondary | ICD-10-CM | POA: Diagnosis not present

## 2020-08-14 DIAGNOSIS — R7309 Other abnormal glucose: Secondary | ICD-10-CM

## 2020-08-14 DIAGNOSIS — I10 Essential (primary) hypertension: Secondary | ICD-10-CM | POA: Diagnosis not present

## 2020-08-14 DIAGNOSIS — E782 Mixed hyperlipidemia: Secondary | ICD-10-CM | POA: Diagnosis not present

## 2020-08-14 DIAGNOSIS — F0150 Vascular dementia without behavioral disturbance: Secondary | ICD-10-CM | POA: Diagnosis not present

## 2020-08-14 LAB — LIPID PANEL
Cholesterol: 146 mg/dL (ref <200)
HDL: 42 mg/dL (ref >=40)
LDL Cholesterol (Calc): 83 mg/dL (calc)
Non-HDL Cholesterol (Calc): 104 mg/dL (calc) (ref <130)
Total CHOL/HDL Ratio: 3.5 (calc) (ref <5.0)
Triglycerides: 112 mg/dL (ref <150)

## 2020-08-14 LAB — COMPLETE METABOLIC PANEL WITH GFR
AG Ratio: 1.6 (calc) (ref 1.0–2.5)
ALT: 22 U/L (ref 9–46)
AST: 22 U/L (ref 10–35)
Albumin: 4 g/dL (ref 3.6–5.1)
Alkaline phosphatase (APISO): 75 U/L (ref 35–144)
BUN: 17 mg/dL (ref 7–25)
CO2: 28 mmol/L (ref 20–32)
Calcium: 9.3 mg/dL (ref 8.6–10.3)
Chloride: 106 mmol/L (ref 98–110)
Creat: 1.09 mg/dL (ref 0.70–1.11)
GFR, Est African American: 73 mL/min/{1.73_m2} (ref >=60)
GFR, Est Non African American: 63 mL/min/{1.73_m2} (ref >=60)
Globulin: 2.5 g/dL (calc) (ref 1.9–3.7)
Glucose, Bld: 104 mg/dL — ABNORMAL HIGH (ref 65–99)
Potassium: 5.3 mmol/L (ref 3.5–5.3)
Sodium: 141 mmol/L (ref 135–146)
Total Bilirubin: 0.6 mg/dL (ref 0.2–1.2)
Total Protein: 6.5 g/dL (ref 6.1–8.1)

## 2020-08-14 LAB — CBC WITH DIFFERENTIAL/PLATELET
Absolute Monocytes: 786 cells/uL (ref 200–950)
Basophils Absolute: 68 cells/uL (ref 0–200)
Basophils Relative: 0.7 %
Eosinophils Absolute: 407 cells/uL (ref 15–500)
Eosinophils Relative: 4.2 %
HCT: 45.6 % (ref 38.5–50.0)
Hemoglobin: 15.3 g/dL (ref 13.2–17.1)
Lymphs Abs: 1668 cells/uL (ref 850–3900)
MCH: 31.6 pg (ref 27.0–33.0)
MCHC: 33.6 g/dL (ref 32.0–36.0)
MCV: 94.2 fL (ref 80.0–100.0)
MPV: 10.8 fL (ref 7.5–12.5)
Monocytes Relative: 8.1 %
Neutro Abs: 6771 cells/uL (ref 1500–7800)
Neutrophils Relative %: 69.8 %
Platelets: 254 10*3/uL (ref 140–400)
RBC: 4.84 10*6/uL (ref 4.20–5.80)
RDW: 12.4 % (ref 11.0–15.0)
Total Lymphocyte: 17.2 %
WBC: 9.7 10*3/uL (ref 3.8–10.8)

## 2020-08-14 LAB — VITAMIN D 25 HYDROXY (VIT D DEFICIENCY, FRACTURES): Vit D, 25-Hydroxy: 56 ng/mL (ref 30–100)

## 2020-08-14 LAB — MAGNESIUM: Magnesium: 2.4 mg/dL (ref 1.5–2.5)

## 2020-08-14 LAB — TSH: TSH: 2.65 mIU/L (ref 0.40–4.50)

## 2020-08-14 MED ORDER — MEMANTINE HCL 10 MG PO TABS: 10.0000 mg | ORAL_TABLET | Freq: Two times a day (BID) | ORAL | 11 refills | Status: DC

## 2020-08-14 NOTE — Patient Instructions (Addendum)
Goals    . Exercise 150 min/wk Moderate Activity     Incorporate weights/resistance exercises     . LDL CALC < 100       Consider pfizer booster around 10/06/2020 -   Continue with baby aspirin, cholesterol medication, and regular moderate intensity exercise, aim for 150+ min per week  Ideally go fast enough that you can't complete a full sentence without taking a breath  Check with wife to make sure hearing aids have been checked in the last 1-2 years; having a lot of trouble hearing despite wearing the aid    Vascular Dementia Dementia is a condition in which a person has problems with thinking, memory, and behavior that are severe enough to interfere with daily life. Vascular dementia is a type of dementia. It results from brain damage that is caused by the brain not getting enough blood. This condition may also be called vascular cognitive impairment. What are the causes? Vascular dementia is caused by conditions that lessen blood flow to the brain. Common causes of this condition include:  Multiple small strokes. These may happen without symptoms (silent stroke).  Major stroke.  Damage to small blood vessels in the brain (cerebral small vessel disease). What increases the risk? The following factors may make you more likely to develop this condition:  Having had a stroke.  Having high blood pressure (hypertension) or high cholesterol.  Having a disease that affects the heart or blood vessels.  Smoking.  Having diabetes.  Having metabolic syndrome.  Being obese.  Not being active.  Having depression.  Being over age 10. What are the signs or symptoms? Symptoms can vary from one person to another. Symptoms may be mild or severe depending on the amount of damage and which parts of the brain have been affected. Symptoms may begin suddenly or may develop slowly. Mental symptoms of vascular dementia may include:  Confusion.  Memory problems.  Poor attention  and concentration.  Trouble understanding speech.  Depression.  Personality changes.  Trouble recognizing familiar people.  Agitation or aggression.  Paranoia.  Delusions or hallucinations. Physical symptoms of vascular dementia may include:  Weakness.  Poor balance.  Loss of bladder or bowel control (incontinence).  Unsteady walking (gait).  Speaking problems. Behavioral symptoms of vascular dementia may include:  Getting lost in familiar places.  Problems with planning and judgment.  Trouble following instructions.  Social problems.  Emotional outbursts.  Trouble with daily activities and self-care.  Problems handling money. Symptoms may remain stable, or they may get worse over time. Symptoms of vascular dementia may be similar to those of Alzheimer's disease. The two conditions can occur together (mixed dementia). How is this diagnosed? Your health care provider will consider your medical history and symptoms or changes that are reported by friends and family. Your health care provider will do a physical exam and may order lab tests or other tests that check brain and nervous system function. Tests that may be done include:  Blood tests.  Brain imaging tests.  Tests of movement, speech, and other daily activities (neurological exam).  Tests of memory, thinking, and problem-solving (neuropsychological or neurocognitive testing). There is not a specific test to diagnose vascular dementia. Diagnosis may involve several specialists. These may include:  A health care provider who specializes in the brain and nervous system (neurologist).  A health provider who specializes in understanding how problems in the brain can alter behavior and cognitive function (neuropsychologist). How is this treated? There is no  cure for vascular dementia. Brain damage that has already occurred cannot be reversed. Treatment depends on:  How severe the condition is.  Which  parts of your brain have been affected.  Your overall health. Treatment measures aim to:  Treat the underlying cause of vascular dementia and manage risk factors. This may include: ? Controlling blood pressure. ? Lowering cholesterol. ? Treating diabetes. ? Quitting smoking. ? Losing weight or maintaining a healthy weight. ? Eating a healthy, balanced diet. ? Getting regular exercise.  Manage symptoms.  Prevent further brain damage.  Improve the person's health and quality of life. Treatment for dementia may involve a team of health care providers, including:  A neurologist.  A provider who specializes in disorders of the mind (psychiatrist).  A provider who specializes in helping people learn daily living skills (occupational therapist).  A provider who focuses on speech and language changes (Electrical engineer).  A heart specialist (cardiologist).  A provider who helps people learn how to manage physical changes, such as movement and walking (exercise physiologist or physical therapist). Follow these instructions at home: Lifestyle  People with vascular dementia may need regular help at home or daily care from a family member or home health care worker. Home care for a person with vascular dementia depends on what caused the condition and how severe the symptoms are. General guidelines for caregivers include:  Help the person with dementia remember people, appointments, and daily activities.  Help the person with dementia manage his or her medicines.  Help family and friends learn about ways to communicate with the person with dementia.  Create a safe living space to reduce the risk of injury or falls.  Find a support group to help caregivers and family cope with the effects of dementia.  General instructions  Help the person take over-the-counter and prescription medicines only as told by the health care provider.  Follow the health care provider's instructions  for treating the condition that caused the dementia.  Make sure the person keeps all follow-up visits as told by the health care provider. This is important. Contact a health care provider if:  A fever develops.  New behavioral problems develop.  Problems with swallowing develop.  Confusion gets worse.  Sleepiness gets worse. Get help right away if:  Loss of consciousness occurs.  There is a sudden loss of speech, balance, or thinking ability.  New numbness or paralysis occurs.  Sudden, severe headache occurs.  Vision is lost or suddenly gets worse in one or both eyes. Summary  Vascular dementia is a type of dementia. It results from brain damage that is caused by the brain not getting enough blood.  Vascular dementia is caused by conditions that lessen blood flow to the brain. Common causes of this condition include stroke and damage to small blood vessels in the brain.  Treatment focuses on treating the underlying cause of vascular dementia and managing any risk factors.  People with vascular dementia may need regular help at home or daily care from a family member or home health care worker.  Contact a health care provider if you or your caregiver notice any new symptoms. This information is not intended to replace advice given to you by your health care provider. Make sure you discuss any questions you have with your health care provider. Document Revised: 09/13/2018 Document Reviewed: 09/14/2018 Elsevier Patient Education  [employer].

## 2020-08-25 ENCOUNTER — Ambulatory Visit: Payer: Medicare Other | Admitting: Adult Health

## 2020-08-27 ENCOUNTER — Ambulatory Visit (INDEPENDENT_AMBULATORY_CARE_PROVIDER_SITE_OTHER): Payer: Medicare Other | Admitting: Adult Health

## 2020-08-27 ENCOUNTER — Other Ambulatory Visit: Payer: Self-pay | Admitting: Physician Assistant

## 2020-08-27 ENCOUNTER — Encounter: Payer: Self-pay | Admitting: Adult Health

## 2020-08-27 ENCOUNTER — Other Ambulatory Visit: Payer: Self-pay

## 2020-08-27 VITALS — BP 108/58 | HR 63 | Temp 97.3°F | Wt 150.0 lb

## 2020-08-27 DIAGNOSIS — M545 Low back pain, unspecified: Secondary | ICD-10-CM

## 2020-08-27 MED ORDER — PREDNISONE 20 MG PO TABS: ORAL_TABLET | ORAL | 0 refills | Status: DC

## 2020-08-27 NOTE — Progress Notes (Signed)
Assessment and Plan:  Carel was seen today for back pain and rash.  Diagnoses and all orders for this visit:  Acute midline low back pain without sciatica - negative straight leg Prednisone was prescribed, RICE, and exercise given, If not better follow up in office or will refer to PT/orthopedics. Natural history and expected course discussed. Questions answered. Neurosurgeon distributed. Proper lifting, bending technique discussed. Stretching exercises discussed. Ice to affected area as needed for local pain relief. OTC analgesics as needed. -     predniSONE (DELTASONE) 20 MG tablet; 2 tablets daily for 3 days, 1 tablet daily for 4 days. Take first thing in the morning with food for back pain.  Further disposition pending results of labs. Discussed med's effects and SE's.   Over 15 minutes of exam, counseling, chart review, and critical decision making was performed.   Future Appointments  Date Time Provider [place]  09/11/2020  9:45 AM Suzzanne Cloud, NP [facility] None  11/25/2020  9:00 AM Unk Pinto, MD [facility] None  02/25/2021  9:00 AM Liane Comber, NP [facility] None    ------------------------------------------------------------------------------------------------------------------   HPI BP (!) 108/58   Pulse 63   Temp (!) 97.3 F (36.3 C)   Wt 150 lb (68 kg)   SpO2 97%   BMI 21.83 kg/m   84 y.o.male with vascular dementia presents for evaluation of back pain.  Denies hx of back problems or pain episodes.  He reports 2-3 months he started noting intermittent lower back pain; can't remember any inciting events. He reports would experience a sharp brief non-radiating pain, 6-7/10, mid midline lower back/upper sacrum when bending over. Would resolve without residual discomfort when upright. Denies any nocturnal sx. Denies any numbness/tingling/weakness in legs, incontinence, bowel changes.   He reports has been trying to crouch instead of  bend over in the last few weeks with less episodes of back pain, but otherwise hasn't tried tylenol, NSAIDs, heat, or stretching. Incidentally notes he hasn't had any pain episodes today.    Past Medical History:  Diagnosis Date  . Cancer [facility])    prostate - basal cell  . GERD (gastroesophageal reflux disease)   . Hearing loss    wear hearing aid  . Hyperlipidemia   . Hypertension   . Inguinal hernia   . Memory loss   . Prediabetes   . Vitamin D deficiency   . Wears glasses   . Wears hearing aid    both ears     No Known Allergies  Current Outpatient Medications on File Prior to Visit  Medication Sig  . Ascorbic Acid (VITAMIN C PO) Take by mouth daily.  [name] aspirin 81 MG tablet Take 81 mg by mouth daily.  [name] atorvastatin (LIPITOR) 40 MG tablet TAKE 1 TABLET BY MOUTH DAILY  . Cholecalciferol (VITAMIN D PO) Take by mouth daily.  [name] CINNAMON PO Take 1,000 mg by mouth daily.  . Coenzyme Q10 200 MG TABS Take 200 mg by mouth daily.  . fish oil-omega-3 fatty acids 1000 MG capsule Take 2 g by mouth daily.  . Flaxseed, Linseed, (FLAXSEED OIL PO) Take 1,000 mg by mouth daily.  . hydrocortisone (PROCTO-MED HC) 2.5 % rectal cream User 3 to 4 x / day as needed  . hydrocortisone-pramoxine (ANALPRAM-HC) 2.5-1 % rectal cream Apply rectally 1-3 times daily as needed for hemorrhoid flare  . Magnesium 250 MG TABS Take 250 mg by mouth daily.  . memantine (NAMENDA) 10 MG tablet Take 1 tablet (10 mg  total) by mouth 2 (two) times daily. For memory.  . Multiple Vitamins-Minerals (MULTIVITAMIN PO) Take by mouth daily.  . Multiple Vitamins-Minerals (ZINC PO) Take by mouth.  . pseudoephedrine (SUDAFED) 30 MG tablet Take 30 mg by mouth every 4 (four) hours as needed for congestion.  . triamcinolone ointment (KENALOG) 0.1 % Apply 1 application topically 2 (two) times daily.  . vitamin C (ASCORBIC ACID) 500 MG tablet Take 500 mg by mouth daily.   No current facility-administered medications on file prior to  visit.    ROS: all negative except above.   Physical Exam:  BP (!) 108/58   Pulse 63   Temp (!) 97.3 F (36.3 C)   Wt 150 lb (68 kg)   SpO2 97%   BMI 21.83 kg/m   General Appearance: Well nourished, in no apparent distress. Eyes: conjunctiva no swelling or erythema ENT/Mouth: Mask in place; mildly HOH.  Neck: Supple Respiratory: Respiratory effort normal, BS equal bilaterally without rales, rhonchi, wheezing or stridor.  Cardio: RRR with no MRGs. 1+ intact symmetrical peripheral pulses without edema.  Abdomen: Soft, + BS.  Non tender, no guarding, rebound, hernias, masses. Lymphatics: Non tender without lymphadenopathy.  Musculoskeletal: Patient is able to ambulate well. Gait is not  Antalgic. Straight leg raising with dorsiflexion negative bilaterally for radicular symptoms. Sensory exam in the legs are normal. Knee reflexes are normal Ankle reflexes are normal Strength is normal and symmetric in arms and legs. There is not SI tenderness to palpation.  There is not paraspinal muscle spasm.  There is not midline tenderness.  ROM of spine with  limited in flexion due to pain.  Skin: Warm, dry without rashes, lesions, ecchymosis.  Neuro: Normal muscle tone, Sensation intact. Slow cognition.  Psych: Awake and oriented X 3, normal affect, Insight and Judgment appropriate.     Izora Ribas, NP 12:09 PM [facility]

## 2020-08-27 NOTE — Patient Instructions (Signed)
Acute Back Pain, Adult Acute back pain is sudden and usually short-lived. It is often caused by an injury to the muscles and tissues in the back. The injury may result from:  A muscle or ligament getting overstretched or torn (strained). Ligaments are tissues that connect bones to each other. Lifting something improperly can cause a back strain.  Wear and tear (degeneration) of the spinal disks. Spinal disks are circular tissue that provides cushioning between the bones of the spine (vertebrae).  Twisting motions, such as while playing sports or doing yard work.  A hit to the back.  Arthritis. You may have a physical exam, lab tests, and imaging tests to find the cause of your pain. Acute back pain usually goes away with rest and home care. Follow these instructions at home: Managing pain, stiffness, and swelling  Take over-the-counter and prescription medicines only as told by your health care provider.  Your health care provider may recommend applying ice during the first 24-48 hours after your pain starts. To do this: ? Put ice in a plastic bag. ? Place a towel between your skin and the bag. ? Leave the ice on for 20 minutes, 2-3 times a day.  If directed, apply heat to the affected area as often as told by your health care provider. Use the heat source that your health care provider recommends, such as a moist heat pack or a heating pad. ? Place a towel between your skin and the heat source. ? Leave the heat on for 20-30 minutes. ? Remove the heat if your skin turns bright red. This is especially important if you are unable to feel pain, heat, or cold. You have a greater risk of getting burned. Activity   Do not stay in bed. Staying in bed for more than 1-2 days can delay your recovery.  Sit up and stand up straight. Avoid leaning forward when you sit, or hunching over when you stand. ? If you work at a desk, sit close to it so you do not need to lean over. Keep your chin tucked  in. Keep your neck drawn back, and keep your elbows bent at a right angle. Your arms should look like the letter "L." ? Sit high and close to the steering wheel when you drive. Add lower back (lumbar) support to your car seat, if needed.  Take short walks on even surfaces as soon as you are able. Try to increase the length of time you walk each day.  Do not sit, drive, or stand in one place for more than 30 minutes at a time. Sitting or standing for long periods of time can put stress on your back.  Do not drive or use heavy machinery while taking prescription pain medicine.  Use proper lifting techniques. When you bend and lift, use positions that put less stress on your back: ? Bend your knees. ? Keep the load close to your body. ? Avoid twisting.  Exercise regularly as told by your health care provider. Exercising helps your back heal faster and helps prevent back injuries by keeping muscles strong and flexible.  Work with a physical therapist to make a safe exercise program, as recommended by your health care provider. Do any exercises as told by your physical therapist. Lifestyle  Maintain a healthy weight. Extra weight puts stress on your back and makes it difficult to have good posture.  Avoid activities or situations that make you feel anxious or stressed. Stress and anxiety increase muscle   tension and can make back pain worse. Learn ways to manage anxiety and stress, such as through exercise. General instructions  Sleep on a firm mattress in a comfortable position. Try lying on your side with your knees slightly bent. If you lie on your back, put a pillow under your knees.  Follow your treatment plan as told by your health care provider. This may include: ? Cognitive or behavioral therapy. ? Acupuncture or massage therapy. ? Meditation or yoga. Contact a health care provider if:  You have pain that is not relieved with rest or medicine.  You have increasing pain going down  into your legs or buttocks.  Your pain does not improve after 2 weeks.  You have pain at night.  You lose weight without trying.  You have a fever or chills. Get help right away if:  You develop new bowel or bladder control problems.  You have unusual weakness or numbness in your arms or legs.  You develop nausea or vomiting.  You develop abdominal pain.  You feel faint. Summary  Acute back pain is sudden and usually short-lived.  Use proper lifting techniques. When you bend and lift, use positions that put less stress on your back.  Take over-the-counter and prescription medicines and apply heat or ice as directed by your health care provider. This information is not intended to replace advice given to you by your health care provider. Make sure you discuss any questions you have with your health care provider. Document Revised: 04/03/2019 Document Reviewed: 07/27/2017 Elsevier Patient Education  [employer].      Back Exercises These exercises help to make your trunk and back strong. They also help to keep the lower back flexible. Doing these exercises can help to prevent back pain or lessen existing pain.  If you have back pain, try to do these exercises 2-3 times each day or as told by your doctor.  As you get better, do the exercises once each day. Repeat the exercises more often as told by your doctor.  To stop back pain from coming back, do the exercises once each day, or as told by your doctor. Exercises Single knee to chest Do these steps 3-5 times in a row for each leg: 1. Lie on your back on a firm bed or the floor with your legs stretched out. 2. Bring one knee to your chest. 3. Grab your knee or thigh with both hands and hold them it in place. 4. Pull on your knee until you feel a gentle stretch in your lower back or buttocks. 5. Keep doing the stretch for 10-30 seconds. 6. Slowly let go of your leg and straighten it. Pelvic tilt Do these steps  5-10 times in a row: 1. Lie on your back on a firm bed or the floor with your legs stretched out. 2. Bend your knees so they point up to the ceiling. Your feet should be flat on the floor. 3. Tighten your lower belly (abdomen) muscles to press your lower back against the floor. This will make your tailbone point up to the ceiling instead of pointing down to your feet or the floor. 4. Stay in this position for 5-10 seconds while you gently tighten your muscles and breathe evenly. Cat-cow Do these steps until your lower back bends more easily: 1. Get on your hands and knees on a firm surface. Keep your hands under your shoulders, and keep your knees under your hips. You may put padding under  your knees. 2. Let your head hang down toward your chest. Tighten (contract) the muscles in your belly. Point your tailbone toward the floor so your lower back becomes rounded like the back of a cat. 3. Stay in this position for 5 seconds. 4. Slowly lift your head. Let the muscles of your belly relax. Point your tailbone up toward the ceiling so your back forms a sagging arch like the back of a cow. 5. Stay in this position for 5 seconds.  Press-ups Do these steps 5-10 times in a row: 1. Lie on your belly (face-down) on the floor. 2. Place your hands near your head, about shoulder-width apart. 3. While you keep your back relaxed and keep your hips on the floor, slowly straighten your arms to raise the top half of your body and lift your shoulders. Do not use your back muscles. You may change where you place your hands in order to make yourself more comfortable. 4. Stay in this position for 5 seconds. 5. Slowly return to lying flat on the floor.  Bridges Do these steps 10 times in a row: 1. Lie on your back on a firm surface. 2. Bend your knees so they point up to the ceiling. Your feet should be flat on the floor. Your arms should be flat at your sides, next to your body. 3. Tighten your butt muscles and  lift your butt off the floor until your waist is almost as high as your knees. If you do not feel the muscles working in your butt and the back of your thighs, slide your feet 1-2 inches farther away from your butt. 4. Stay in this position for 3-5 seconds. 5. Slowly lower your butt to the floor, and let your butt muscles relax. If this exercise is too easy, try doing it with your arms crossed over your chest. Belly crunches Do these steps 5-10 times in a row: 1. Lie on your back on a firm bed or the floor with your legs stretched out. 2. Bend your knees so they point up to the ceiling. Your feet should be flat on the floor. 3. Cross your arms over your chest. 4. Tip your chin a little bit toward your chest but do not bend your neck. 5. Tighten your belly muscles and slowly raise your chest just enough to lift your shoulder blades a tiny bit off of the floor. Avoid raising your body higher than that, because it can put too much stress on your low back. 6. Slowly lower your chest and your head to the floor. Back lifts Do these steps 5-10 times in a row: 1. Lie on your belly (face-down) with your arms at your sides, and rest your forehead on the floor. 2. Tighten the muscles in your legs and your butt. 3. Slowly lift your chest off of the floor while you keep your hips on the floor. Keep the back of your head in line with the curve in your back. Look at the floor while you do this. 4. Stay in this position for 3-5 seconds. 5. Slowly lower your chest and your face to the floor. Contact a doctor if:  Your back pain gets a lot worse when you do an exercise.  Your back pain does not get better 2 hours after you exercise. If you have any of these problems, stop doing the exercises. Do not do them again unless your doctor says it is okay. Get help right away if:  You have sudden,  very bad back pain. If this happens, stop doing the exercises. Do not do them again unless your doctor says it is  okay. This information is not intended to replace advice given to you by your health care provider. Make sure you discuss any questions you have with your health care provider. Document Revised: 09/07/2018 Document Reviewed: 09/07/2018 Elsevier Patient Education  2020 [employer].

## 2020-09-08 DIAGNOSIS — Z85828 Personal history of other malignant neoplasm of skin: Secondary | ICD-10-CM | POA: Diagnosis not present

## 2020-09-08 DIAGNOSIS — L570 Actinic keratosis: Secondary | ICD-10-CM | POA: Diagnosis not present

## 2020-09-08 DIAGNOSIS — L821 Other seborrheic keratosis: Secondary | ICD-10-CM | POA: Diagnosis not present

## 2020-09-11 ENCOUNTER — Ambulatory Visit: Payer: Medicare Other | Admitting: Neurology

## 2020-09-23 DIAGNOSIS — Z23 Encounter for immunization: Secondary | ICD-10-CM | POA: Diagnosis not present

## 2020-10-07 DIAGNOSIS — Z23 Encounter for immunization: Secondary | ICD-10-CM | POA: Diagnosis not present

## 2020-10-12 NOTE — Progress Notes (Signed)
Assessment and Plan:  Brian Proctor was seen today for rash.  Diagnoses and all orders for this visit:  Rash and nonspecific skin eruption  Irritant contact dermatitis, unspecified trigger -     dexamethasone (DECADRON) 0.5 MG tablet; Take 1 tab 3 x day - 3 days, then 2 x day - 3 days, then 1 tab daily May use OTC cetirizine nightly for itching May use OTC hydrocortisone topical to itchy areas.  Discussed monitoring for environmental changes.  Discussed asking wife regarding laundry soap or product changes.  Contact office with any new or worsening symptoms.  Further disposition pending results if labs check today. Discussed med's effects and SE's.   Over 30 minutes of face to face interview, exam, counseling, chart review, and critical decision making was performed.  j Future Appointments  Date Time Provider [place]  11/25/2020  9:00 AM Unk Pinto, MD [facility] None  02/25/2021  9:30 AM Garnet Sierras, NP [facility] None   ------------------------------------------------------------------------------------------------------------------   HPI 84 y.o.male presents for evaluation of a rash that started two days ago and he first noticed this on his upper chest.  Reports some itching but not constant.  Report he showered today but he has not put anything on the rash.  It is on his abdomen and his arms bilateral, antecubital.  Denies any pain or hypersensitivity, exudate.  He denies being around around anyone who has a rash.  Denies herald patch.   He did try some hydrocortisone cream that helped slightly with the itching.  Denies any fever, chills, new personal care products, no new medications or supplements, no dietary changes, has not been working outside, no new animals in the home or exposure.    Past Medical History:  Diagnosis Date  . Cancer [facility])    prostate - basal cell  . GERD (gastroesophageal reflux disease)   . Hearing loss    wear hearing aid  .  Hyperlipidemia   . Hypertension   . Inguinal hernia   . Memory loss   . Prediabetes   . Vitamin D deficiency   . Wears glasses   . Wears hearing aid    both ears     No Known Allergies  Current Outpatient Medications on File Prior to Visit  Medication Sig  . Ascorbic Acid (VITAMIN C PO) Take by mouth daily.  [name] aspirin 81 MG tablet Take 81 mg by mouth daily.  [name] atorvastatin (LIPITOR) 40 MG tablet Take 1 tablet    Daily    for Cholesterol  . Cholecalciferol (VITAMIN D PO) Take by mouth daily.  [name] CINNAMON PO Take 1,000 mg by mouth daily.  . Coenzyme Q10 200 MG TABS Take 200 mg by mouth daily.  . fish oil-omega-3 fatty acids 1000 MG capsule Take 2 g by mouth daily.  . Flaxseed, Linseed, (FLAXSEED OIL PO) Take 1,000 mg by mouth daily.  . hydrocortisone (PROCTO-MED HC) 2.5 % rectal cream User 3 to 4 x / day as needed  . hydrocortisone-pramoxine (ANALPRAM-HC) 2.5-1 % rectal cream Apply rectally 1-3 times daily as needed for hemorrhoid flare  . Magnesium 250 MG TABS Take 250 mg by mouth daily.  . memantine (NAMENDA) 10 MG tablet Take 1 tablet (10 mg total) by mouth 2 (two) times daily. For memory.  . Multiple Vitamins-Minerals (MULTIVITAMIN PO) Take by mouth daily.  . Multiple Vitamins-Minerals (ZINC PO) Take by mouth.  . predniSONE (DELTASONE) 20 MG tablet 2 tablets daily for 3 days, 1 tablet daily for 4 days.  Take first thing in the morning with food for back pain.  . pseudoephedrine (SUDAFED) 30 MG tablet Take 30 mg by mouth every 4 (four) hours as needed for congestion.  . triamcinolone ointment (KENALOG) 0.1 % Apply 1 application topically 2 (two) times daily.  . vitamin C (ASCORBIC ACID) 500 MG tablet Take 500 mg by mouth daily.   No current facility-administered medications on file prior to visit.    ROS: all negative except above.   Physical Exam:  There were no vitals taken for this visit.  General Appearance: Well nourished, in no apparent distress. Eyes: PERRLA, EOMs,  conjunctiva no swelling or erythema Sinuses: No Frontal/maxillary tenderness ENT/Mouth: Ext aud canals clear, TMs without erythema, bulging. Wearing hearing aids. No erythema, swelling, or exudate on post pharynx.  Tonsils not swollen or erythematous. Hearing normal.  Neck: Supple, thyroid normal.  Respiratory: Respiratory effort normal, BS equal bilaterally without rales, rhonchi, wheezing or stridor.  Cardio: RRR with no MRGs. Brisk peripheral pulses without edema.  Abdomen: Soft, + BS.  Non tender, no guarding, rebound, hernias, masses. Lymphatics: Non tender without lymphadenopathy.  Musculoskeletal: Full ROM, 5/5 strength, normal gait.  Skin: Warm, dry without lesions, ecchymosis. Single lesion papule eruption to clothing covered areas, front of chest, few spot noted on back and mild to bilateral arms antecubital. Neuro: Cranial nerves intact. Normal muscle tone, no cerebellar symptoms. Sensation intact.  Psych: Awake and oriented X 3, normal affect, Insight and Judgment appropriate.      Garnet Sierras, Laqueta Jean, DNP [facility] 10/13/2020  5:10 PM

## 2020-10-13 ENCOUNTER — Other Ambulatory Visit: Payer: Self-pay

## 2020-10-13 ENCOUNTER — Ambulatory Visit (INDEPENDENT_AMBULATORY_CARE_PROVIDER_SITE_OTHER): Payer: Medicare Other | Admitting: Adult Health Nurse Practitioner

## 2020-10-13 ENCOUNTER — Encounter: Payer: Self-pay | Admitting: Adult Health Nurse Practitioner

## 2020-10-13 VITALS — BP 126/74 | HR 59 | Temp 97.5°F | Wt 151.0 lb

## 2020-10-13 DIAGNOSIS — L249 Irritant contact dermatitis, unspecified cause: Secondary | ICD-10-CM | POA: Diagnosis not present

## 2020-10-13 DIAGNOSIS — R21 Rash and other nonspecific skin eruption: Secondary | ICD-10-CM

## 2020-10-13 MED ORDER — DEXAMETHASONE 0.5 MG PO TABS: ORAL_TABLET | ORAL | 0 refills | Status: DC

## 2020-10-13 NOTE — Patient Instructions (Addendum)
We are going to send in Dexamethasone, steroid, taper for you.  This will help to get rid of the rash.  You can use topical hydrocortisone on the itchy areas.  You may also take Cetirizine (Zyrtec) at night.  This is an antihistamine and will help with the itching.  It can make you sleepy so take this at night.  Please let us know if you have any new or worsening symptoms.    Contact Dermatitis Dermatitis is redness, soreness, and swelling (inflammation) of the skin. Contact dermatitis is a reaction to something that touches the skin. There are two types of contact dermatitis:  Irritant contact dermatitis. This happens when something bothers (irritates) your skin, like soap.  Allergic contact dermatitis. This is caused when you are exposed to something that you are allergic to, such as poison ivy. What are the causes?  Common causes of irritant contact dermatitis include: ? Makeup. ? Soaps. ? Detergents. ? Bleaches. ? Acids. ? Metals, such as nickel.  Common causes of allergic contact dermatitis include: ? Plants. ? Chemicals. ? Jewelry. ? Latex. ? Medicines. ? Preservatives in products, such as clothing. What increases the risk?  Having a job that exposes you to things that bother your skin.  Having asthma or eczema. What are the signs or symptoms? Symptoms may happen anywhere the irritant has touched your skin. Symptoms include:  Dry or flaky skin.  Redness.  Cracks.  Itching.  Pain or a burning feeling.  Blisters.  Blood or clear fluid draining from skin cracks. With allergic contact dermatitis, swelling may occur. This may happen in places such as the eyelids, mouth, or genitals. How is this treated?  This condition is treated by checking for the cause of the reaction and protecting your skin. Treatment may also include: ? Steroid creams, ointments, or medicines. ? Antibiotic medicines or other ointments, if you have a skin infection. ? Lotion or  medicines to help with itching. ? A bandage (dressing). Follow these instructions at home: Skin care  Moisturize your skin as needed.  Put cool cloths on your skin.  Put a baking soda paste on your skin. Stir water into baking soda until it looks like a paste.  Do not scratch your skin.  Avoid having things rub up against your skin.  Avoid the use of soaps, perfumes, and dyes. Medicines  Take or apply over-the-counter and prescription medicines only as told by your doctor.  If you were prescribed an antibiotic medicine, take or apply it as told by your doctor. Do not stop using it even if your condition starts to get better. Bathing  Take a bath with: ? Epsom salts. ? Baking soda. ? Colloidal oatmeal.  Bathe less often.  Bathe in warm water. Avoid using hot water. Bandage care  If you were given a bandage, change it as told by your health care provider.  Wash your hands with soap and water before and after you change your bandage. If soap and water are not available, use hand sanitizer. General instructions  Avoid the things that caused your reaction. If you do not know what caused it, keep a journal. Write down: ? What you eat. ? What skin products you use. ? What you drink. ? What you wear in the area that has symptoms. This includes jewelry.  Check the affected areas every day for signs of infection. Check for: ? More redness, swelling, or pain. ? More fluid or blood. ? Warmth. ? Pus or a bad  smell.  Keep all follow-up visits as told by your doctor. This is important. Contact a doctor if:  You do not get better with treatment.  Your condition gets worse.  You have signs of infection, such as: ? More swelling. ? Tenderness. ? More redness. ? Soreness. ? Warmth.  You have a fever.  You have new symptoms. Get help right away if:  You have a very bad headache.  You have neck pain.  Your neck is stiff.  You throw up (vomit).  You feel very  sleepy.  You see red streaks coming from the area.  Your bone or joint near the area hurts after the skin has healed.  The area turns darker.  You have trouble breathing. Summary  Dermatitis is redness, soreness, and swelling of the skin.  Symptoms may occur where the irritant has touched you.  Treatment may include medicines and skin care.  If you do not know what caused your reaction, keep a journal.  Contact a doctor if your condition gets worse or you have signs of infection. This information is not intended to replace advice given to you by your health care provider. Make sure you discuss any questions you have with your health care provider. Document Revised: 04/04/2019 Document Reviewed: 06/28/2018 Elsevier Patient Education  [place].

## 2020-10-28 DIAGNOSIS — Z85828 Personal history of other malignant neoplasm of skin: Secondary | ICD-10-CM | POA: Diagnosis not present

## 2020-10-28 DIAGNOSIS — R21 Rash and other nonspecific skin eruption: Secondary | ICD-10-CM | POA: Diagnosis not present

## 2020-11-04 ENCOUNTER — Other Ambulatory Visit: Payer: Self-pay

## 2020-11-04 ENCOUNTER — Ambulatory Visit (INDEPENDENT_AMBULATORY_CARE_PROVIDER_SITE_OTHER): Payer: Medicare Other | Admitting: Adult Health Nurse Practitioner

## 2020-11-04 ENCOUNTER — Encounter: Payer: Self-pay | Admitting: Adult Health Nurse Practitioner

## 2020-11-04 VITALS — BP 122/80 | HR 59 | Temp 97.0°F | Wt 155.4 lb

## 2020-11-04 DIAGNOSIS — R21 Rash and other nonspecific skin eruption: Secondary | ICD-10-CM | POA: Diagnosis not present

## 2020-11-04 DIAGNOSIS — L249 Irritant contact dermatitis, unspecified cause: Secondary | ICD-10-CM | POA: Diagnosis not present

## 2020-11-04 DIAGNOSIS — I10 Essential (primary) hypertension: Secondary | ICD-10-CM

## 2020-11-04 MED ORDER — DEXAMETHASONE 4 MG PO TABS: ORAL_TABLET | ORAL | 0 refills | Status: DC

## 2020-11-04 NOTE — Progress Notes (Signed)
FOLLOW UP - ACUTE   Assessment and Plan:  Brian Proctor was seen today for rash.  Diagnoses and all orders for this visit:   Rash and nonspecific skin eruption Irritant contact dermatitis, unspecified trigger Discussed contact irritants, have wife change laundry soaps. Free & Clear, sensitive skin. -     dexamethasone (DECADRON) 4 MG tablet; Take 1 tab 3 x day - 3 days, then 2 x day - 3 days, then 1 tab daily for 8 days then one tablet every other day. May take cetirizine (Zyrtec) nightly can use benadryl during the day for itching. Contact office with any new or worsening symptoms   Essential hypertension controlled today Monitor blood pressure at home; call if consistently over 130/80 Continue DASH diet.   Reminder to go to the ER if any CP, SOB, nausea, dizziness, severe HA, changes vision/speech, left arm numbness and tingling and jaw pain.      Further disposition pending results of labs. Discussed med's effects and SE's.   Over 30 minutes of face to face interview, exam, counseling, chart review, and critical decision making was performed.   Future Appointments  Date Time Provider [place]  11/25/2020  9:00 AM Unk Pinto, MD [facility] None  02/25/2021  9:30 AM Garnet Sierras, NP [facility] None    ------------------------------------------------------------------------------------------------------------------   HPI 84 y.o.male presents for elvation of rash that started on his bilateral arms and spread to his truck.  It has been going on for 2 week maybe more.  He reports it it itchy and red and spreading.  The itching wakes him up at night.  He is not on any new medications or new personal care products since last OV.  He was seen in our office on 10/13/20 and was given a dexamethasone taper.  Reports this helped for 2-3 days once finished but the rash returned.  Past Medical History:  Diagnosis Date  . Cancer [facility])    prostate - basal cell  . GERD  (gastroesophageal reflux disease)   . Hearing loss    wear hearing aid  . Hyperlipidemia   . Hypertension   . Inguinal hernia   . Memory loss   . Prediabetes   . Vitamin D deficiency   . Wears glasses   . Wears hearing aid    both ears     No Known Allergies  Current Outpatient Medications on File Prior to Visit  Medication Sig  . Ascorbic Acid (VITAMIN C PO) Take by mouth daily.  [name] aspirin 81 MG tablet Take 81 mg by mouth daily.  [name] atorvastatin (LIPITOR) 40 MG tablet Take 1 tablet    Daily    for Cholesterol  . Cholecalciferol (VITAMIN D PO) Take by mouth daily.  [name] CINNAMON PO Take 1,000 mg by mouth daily.  . Coenzyme Q10 200 MG TABS Take 200 mg by mouth daily.  . fish oil-omega-3 fatty acids 1000 MG capsule Take 2 g by mouth daily.  . Flaxseed, Linseed, (FLAXSEED OIL PO) Take 1,000 mg by mouth daily.  . hydrocortisone (PROCTO-MED HC) 2.5 % rectal cream User 3 to 4 x / day as needed  . hydrocortisone-pramoxine (ANALPRAM-HC) 2.5-1 % rectal cream Apply rectally 1-3 times daily as needed for hemorrhoid flare  . Magnesium 250 MG TABS Take 250 mg by mouth daily.  . memantine (NAMENDA) 10 MG tablet Take 1 tablet (10 mg total) by mouth 2 (two) times daily. For memory.  . Multiple Vitamins-Minerals (MULTIVITAMIN PO) Take by mouth daily.  [name]  Multiple Vitamins-Minerals (ZINC PO) Take by mouth.  . pseudoephedrine (SUDAFED) 30 MG tablet Take 30 mg by mouth every 4 (four) hours as needed for congestion.  . triamcinolone ointment (KENALOG) 0.1 % Apply 1 application topically 2 (two) times daily.  . vitamin C (ASCORBIC ACID) 500 MG tablet Take 500 mg by mouth daily.   No current facility-administered medications on file prior to visit.    ROS: all negative except above.   Physical Exam:  BP 122/80   Pulse (!) 59   Temp (!) 97 F (36.1 C)   Wt 155 lb 6.4 oz (70.5 kg)   SpO2 97%   BMI 22.62 kg/m   General Appearance: Well nourished, in no apparent distress. Eyes: PERRLA, EOMs,  conjunctiva no swelling or erythema Sinuses: No Frontal/maxillary tenderness ENT/Mouth: Ext aud canals clear, TMs without erythema, bulging. No erythema, swelling, or exudate on post pharynx.  Tonsils not swollen or erythematous. Hearing normal.  Neck: Supple, thyroid normal.  Respiratory: Respiratory effort normal, BS equal bilaterally without rales, rhonchi, wheezing or stridor.  Cardio: RRR with no MRGs. Brisk peripheral pulses without edema.  Abdomen: Soft, + BS.  Non tender, no guarding, rebound, hernias, masses. Lymphatics: Non tender without lymphadenopathy.  Musculoskeletal: Full ROM, 5/5 strength, normal gait.  Skin: Warm, lesions, ecchymosis. Single lesion erythematous diffuse rash bilateral arms and trunk. Neuro: Cranial nerves intact. Normal muscle tone, no cerebellar symptoms. Sensation intact.  Psych: Awake and oriented X 3, normal affect, Insight and Judgment appropriate.     Garnet Sierras, NP 1:17 PM [facility]

## 2020-11-04 NOTE — Patient Instructions (Signed)
   Look at your laundry detergent.  Consider changing to a mild soap, gentle or sensitive skin or "Free & Clear".  We sent in a round of dexamethasone, stronger than previous.  Take this with food.  You can take Cetirizine (Zyrtec) at night to help with itching.  You can take a benadryl during the day to help also.   Please call the office if you have any new or worsening symptoms.

## 2020-11-24 ENCOUNTER — Encounter: Payer: Self-pay | Admitting: Internal Medicine

## 2020-11-24 NOTE — Progress Notes (Signed)
Comprehensive Evaluation & Examination      This very nice 84 y.o.  [name] presents for a  comprehensive evaluation and management of multiple medical co-morbidities.  Patient has been followed for HTN, HLD, Prediabetes and Vitamin D Deficiency.      Labile HTN predates circa 2005. Patient's BP has been controlled at home.  Today's  . Patient denies any cardiac symptoms as chest pain, palpitations, shortness of breath, dizziness or ankle swelling.      Patient's hyperlipidemia is controlled with diet and Atorvastatin. Patient denies myalgias or other medication SE's. Last lipids were at goal:  Lab Results  Component Value Date   CHOL 146 08/14/2020   HDL 42 08/14/2020   LDLCALC 83 08/14/2020   TRIG 112 08/14/2020   CHOLHDL 3.5 08/14/2020       Patient has hx/o prediabetes (A1c 5.7% /2013&5.3% /2014)  and patient denies reactive hypoglycemic symptoms, visual blurring, diabetic polys or paresthesias. Last A1c was normal & at goal:   Lab Results  Component Value Date   HGBA1C 5.4 10/24/2019        Finally, patient has history of Vitamin D Deficiency  ("41" /2013)and last vitamin D was near goal (70-100):   Lab Results  Component Value Date   VD25OH 56 08/14/2020    Current Outpatient Medications on File Prior to Visit  Medication Sig  . VITAMIN C  Take daily.  [name] aspirin 81 MG  Take daily.  [name] atorvastatin 40 MG  Take 1 tablet Daily   . VITAMIN D  Take daily.  [name] CINNAMON  Take 1,000 mg  daily.  . Coenzyme Q10 200 MG  Take daily.  . fish oil-omega-3  1000 MG  Take 2 g  daily.  . Flaxseed 1,000 mg  Takedaily.  . hydrocortisone  2.5 % rectal crm User 3 to 4 x / day as needed  . ANALPRAM-HC 2.5-1 % rectal crm Apply rectally 1-3 times daily as needed   . Magnesium 250 MG TABS Take daily.  . memantine 10 MG tablet Take 1 tablet  2  times daily  . MULTIVITAMIN PO) Take  daily.  . Multiple Vitamins-Minerals-ZINC  Take daily   . pseudoephedrine  30 MG tablet Take  every 4   hours as needed for congestion.  . triamcinolone oint 0.1 % Apply 1 application topically 2 times daily.  . vitamin C 500 MG tablet Take  daily.    No Known Allergies  Past Medical History:  Diagnosis Date  . Cancer [facility])    prostate - basal cell  . GERD (gastroesophageal reflux disease)   . Hearing loss    wear hearing aid  . Hyperlipidemia   . Hypertension   . Inguinal hernia   . Memory loss   . Prediabetes   . Vitamin D deficiency   . Wears glasses   . Wears hearing aid    both ears   Health Maintenance  Topic Date Due  . INFLUENZA VACCINE  07/27/2020  . TETANUS/TDAP  08/07/2028  . COVID-19 Vaccine  Completed  . PNA vac Low Risk Adult  Completed   Immunization History  Administered Date(s) Administered  . DT (Pediatric) 09/03/2015  . Fluad Quad(high Dose 65+) 10/13/2019  . Influenza Inj Mdck Quad Pf 11/10/2018  . Influenza Split 10/28/2016  . Influenza, High Dose Seasonal PF 09/27/2014, 09/03/2015, 10/05/2017  . Influenza,inj,Quad PF,6+ Mos 10/11/2018  . PFIZER SARS-COV-2 Vaccination 01/17/2020, 02/07/2020  . Pneumococcal Conjugate-13 10/16/2014  . Pneumococcal-Unspecified 02/03/2011  .  Td 07/26/2005, 08/07/2018  . Zoster 12/27/2006   Last Colon -  04/2012 - Dr Arty Baumgartner - recc no f/u due to age   Past Surgical History:  Procedure Laterality Date  . COLONOSCOPY     x2  . INGUINAL HERNIA REPAIR  01/15/2013   Procedure: HERNIA REPAIR INGUINAL ADULT;  Surgeon: Gwenyth Ober, MD;  Location: [place];  Service: General;  Laterality: Right;  . [place] or 2000   due to cancer  . TONSILLECTOMY     as a child   Family History  Problem Relation Age of Onset  . Other Mother        bowel infarction  . COPD Father   . Heart disease Father   . Autism Son    Social History   Socioeconomic History  . Marital status: Married    Spouse name: Vaughan Basta  . Number of children: 2 sons with Autism who lives in a group home  . Years of  education: college  Occupational History  . 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 Smoking status: Former Smoker    Packs/day: 1.00    Years: 40.00    Pack years: 40.00    Quit date: 01/10/1999    Years since quitting: 21.8  . Smokeless tobacco: Never Used  Substance and Sexual Activity  . Alcohol use: No    Comment: occasional  . Drug use: No  . Sexual activity: Not on file  Social History Narrative   Right handed    Caffeine use: coffee daily   Lives with wife, Vaughan Basta and his son [name])    ROS Constitutional: Denies fever, chills, weight loss/gain, headaches, insomnia,  night sweats or change in appetite. Does c/o fatigue. Eyes: Denies redness, blurred vision, diplopia, discharge, itchy or watery eyes.  ENT: Denies discharge, congestion, post nasal drip, epistaxis, sore throat, earache, hearing loss, dental pain, Tinnitus, Vertigo, Sinus pain or snoring.  Cardio: Denies chest pain, palpitations, irregular heartbeat, syncope, dyspnea, diaphoresis, orthopnea, PND, claudication or edema Respiratory: denies cough, dyspnea, DOE, pleurisy, hoarseness, laryngitis or wheezing.  Gastrointestinal: Denies dysphagia, heartburn, reflux, water brash, pain, cramps, nausea, vomiting, bloating, diarrhea, constipation, hematemesis, melena, hematochezia, jaundice or hemorrhoids Genitourinary: Denies dysuria, frequency, discharge, hematuria or flank pain. Has urgency, nocturia x 2-3 & occasional hesitancy. Musculoskeletal: Denies arthralgia, myalgia, stiffness, Jt. Swelling, pain, limp or strain/sprain. Denies Falls. Skin: Denies puritis, rash, hives, warts, acne, eczema or change in skin lesion Neuro: No weakness, tremor, incoordination, spasms, paresthesia or pain Psychiatric: Denies confusion, memory loss or sensory loss. Denies Depression. Endocrine: Denies change in weight, skin, hair change, nocturia, and paresthesia, diabetic polys, visual blurring or hyper  / hypo glycemic episodes.  Heme/Lymph: No excessive bleeding, bruising or enlarged lymph nodes.  Physical Exam  There were no vitals taken for this visit.  General Appearance: Well nourished and well groomed and in no apparent distress.  Eyes: PERRLA, EOMs, conjunctiva no swelling or erythema, normal fundi and vessels. Sinuses: No frontal/maxillary tenderness ENT/Mouth: EACs patent / TMs  nl. Nares clear without erythema, swelling, mucoid exudates. Oral hygiene is good. No erythema, swelling, or exudate. Tongue normal, non-obstructing. Tonsils not swollen or erythematous. Hearing normal.  Neck: Supple, thyroid not palpable. No bruits, nodes or JVD. Respiratory: Respiratory effort normal.  BS equal and clear bilateral without rales, rhonci, wheezing or stridor. Cardio: Heart sounds are normal with regular rate and rhythm and no murmurs, rubs  or gallops. Peripheral pulses are normal and equal bilaterally without edema. No aortic or femoral bruits. Chest: symmetric with normal excursions and percussion.  Abdomen: Soft, with Nl bowel sounds. Nontender, no guarding, rebound, hernias, masses, or organomegaly.  Lymphatics: Non tender without lymphadenopathy.  Musculoskeletal: Full ROM all peripheral extremities, joint stability, 5/5 strength, and normal gait. Skin: Warm and dry without rashes, lesions, cyanosis, clubbing or  ecchymosis.  Neuro: Cranial nerves intact, reflexes equal bilaterally. Normal muscle tone, no cerebellar symptoms. Sensation intact.  Pysch: Alert and oriented X 3 with normal affect, insight and judgment appropriate.   Assessment and Plan  1. Labile hypertension  - EKG 12-Lead - [place], RETROPERITNL ABD,  LTD - Urinalysis, Routine w reflex microscopic - Microalbumin / creatinine urine ratio - CBC with Differential/Platelet - COMPLETE METABOLIC PANEL WITH GFR - Magnesium - TSH  2. Hyperlipidemia, mixed  - EKG 12-Lead - [place], RETROPERITNL ABD,  LTD - Lipid panel -  TSH  3. Abnormal glucose  - EKG 12-Lead - [place], RETROPERITNL ABD,  LTD - Hemoglobin A1c - Insulin, random  4. 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 Screening for colorectal cancer  - POC Hemoccult Bld/Stl   7. History of prostate cancer  - PSA  8. Screening for ischemic heart disease  - EKG 12-Lead  9. FHx: heart disease  - EKG 12-Lead - [place], RETROPERITNL ABD,  LTD  10. Former smoker  - EKG 12-Lead - [place], RETROPERITNL ABD,  LTD  11. Screening for AAA (aortic abdominal aneurysm)  - [place], RETROPERITNL ABD,  LTD  12. Medication management  - Urinalysis, Routine w reflex microscopic - Microalbumin / creatinine urine ratio - CBC with Differential/Platelet - COMPLETE METABOLIC PANEL WITH GFR - Magnesium - Lipid panel - TSH - Hemoglobin A1c - Insulin, random - VITAMIN D 25 Hydroxy         Patient was counseled in prudent diet, weight control to achieve/maintain BMI less than 25, BP monitoring, regular exercise and medications as discussed.  Discussed med effects and SE's. Routine screening labs and tests as requested with regular follow-up as recommended. Over 40 minutes of exam, counseling, chart review and high complex critical decision making was performed   Kirtland Bouchard, MD

## 2020-11-24 NOTE — Patient Instructions (Signed)

## 2020-11-25 ENCOUNTER — Other Ambulatory Visit: Payer: Self-pay

## 2020-11-25 ENCOUNTER — Ambulatory Visit (INDEPENDENT_AMBULATORY_CARE_PROVIDER_SITE_OTHER): Payer: Medicare Other | Admitting: Internal Medicine

## 2020-11-25 ENCOUNTER — Encounter: Payer: Self-pay | Admitting: Internal Medicine

## 2020-11-25 VITALS — BP 120/70 | HR 58 | Temp 97.0°F | Ht 69.0 in | Wt 157.0 lb

## 2020-11-25 DIAGNOSIS — E782 Mixed hyperlipidemia: Secondary | ICD-10-CM | POA: Diagnosis not present

## 2020-11-25 DIAGNOSIS — Z8546 Personal history of malignant neoplasm of prostate: Secondary | ICD-10-CM

## 2020-11-25 DIAGNOSIS — R0989 Other specified symptoms and signs involving the circulatory and respiratory systems: Secondary | ICD-10-CM | POA: Diagnosis not present

## 2020-11-25 DIAGNOSIS — N138 Other obstructive and reflux uropathy: Secondary | ICD-10-CM | POA: Diagnosis not present

## 2020-11-25 DIAGNOSIS — R7309 Other abnormal glucose: Secondary | ICD-10-CM | POA: Diagnosis not present

## 2020-11-25 DIAGNOSIS — Z1211 Encounter for screening for malignant neoplasm of colon: Secondary | ICD-10-CM

## 2020-11-25 DIAGNOSIS — Z79899 Other long term (current) drug therapy: Secondary | ICD-10-CM | POA: Diagnosis not present

## 2020-11-25 DIAGNOSIS — Z8249 Family history of ischemic heart disease and other diseases of the circulatory system: Secondary | ICD-10-CM

## 2020-11-25 DIAGNOSIS — R21 Rash and other nonspecific skin eruption: Secondary | ICD-10-CM

## 2020-11-25 DIAGNOSIS — N401 Enlarged prostate with lower urinary tract symptoms: Secondary | ICD-10-CM

## 2020-11-25 DIAGNOSIS — Z136 Encounter for screening for cardiovascular disorders: Secondary | ICD-10-CM | POA: Diagnosis not present

## 2020-11-25 DIAGNOSIS — Z87891 Personal history of nicotine dependence: Secondary | ICD-10-CM

## 2020-11-25 DIAGNOSIS — E559 Vitamin D deficiency, unspecified: Secondary | ICD-10-CM

## 2020-11-25 NOTE — Progress Notes (Signed)
Comprehensive Evaluation & Examination      This very nice 84 y.o.  [name] presents for a  comprehensive evaluation and management of multiple medical co-morbidities.  Patient has been followed for HTN, HLD, Prediabetes, Vascular Dementia and Vitamin D Deficiency.      Patient was treated about 3 weeks ago with a steroid pulse /taper for a generalized pruritic  morbiliform rash - suspected allergic, but rash has not improved. No known new exposures, but patient's mild Dementia limits insight. Patient denies rash in wife or son that lives with him.       Labile HTN predates circa 2005. Patient's BP has been controlled at home.  Today's BP is at goal - 120/70. Patient denies any cardiac symptoms as chest pain, palpitations, shortness of breath, dizziness or ankle swelling.      Patient's hyperlipidemia is controlled with diet and Atorvastatin. Patient denies myalgias or other medication SE's. Last lipids were at goal:  Lab Results  Component Value Date   CHOL 146 08/14/2020   HDL 42 08/14/2020   LDLCALC 83 08/14/2020   TRIG 112 08/14/2020   CHOLHDL 3.5 08/14/2020       Patient has hx/o prediabetes (A1c 5.7% /2013&5.3% /2014) and patient denies reactive hypoglycemic symptoms, visual blurring, diabetic polys or paresthesias. Last A1c was normal & at goal:   Lab Results  Component Value Date   HGBA1C 5.4 10/24/2019        Finally, patient has history of Vitamin D Deficiency  ("41" /2013)and last vitamin D was near goal (70-100):   Lab Results  Component Value Date   VD25OH 56 08/14/2020    Current Outpatient Medications on File Prior to Visit  Medication Sig   VITAMIN C  Take daily.   aspirin 81 MG  Take daily.   atorvastatin 40 MG  Take 1 tablet Daily    VITAMIN D  Take daily.   CINNAMON  Take 1,000 mg  daily.   Coenzyme Q10 200 MG  Take daily.   fish oil-omega-3  1000 MG  Take 2 g  daily.   Flaxseed 1,000 mg  Takedaily.   hydrocortisone  2.5 % rectal crm User 3  to 4 x / day as needed   ANALPRAM-HC 2.5-1 % rectal crm Apply rectally 1-3 times daily as needed    Magnesium 250 MG TABS Take daily.   memantine 10 MG tablet Take 1 tablet  2  times daily   MULTIVITAMIN PO) Take  daily.   Multiple Vitamins-Minerals-ZINC  Take daily    pseudoephedrine  30 MG tablet Take  every 4  hours as needed for congestion.   triamcinolone oint 0.1 % Apply 1 application topically 2 times daily.   vitamin C 500 MG tablet Take  daily.    No Known Allergies  Past Medical History:  Diagnosis Date   Cancer ([place])    prostate - basal cell   GERD (gastroesophageal reflux disease)    Hearing loss    wear hearing aid   Hyperlipidemia    Hypertension    Inguinal hernia    Memory loss    Prediabetes    Vitamin D deficiency    Wears glasses    Wears hearing aid    both ears   Health Maintenance  Topic Date Due   INFLUENZA VACCINE  07/27/2020   TETANUS/TDAP  08/07/2028   COVID-19 Vaccine  Completed   PNA vac Low Risk Adult  Completed   Immunization History  Administered Date(s)  Administered   DT (Pediatric) 09/03/2015   Fluad Quad(high Dose 65+) 10/13/2019   Influenza Inj Mdck Quad Pf 11/10/2018   Influenza Split 10/28/2016   Influenza, High Dose Seasonal PF 09/27/2014, 09/03/2015, 10/05/2017   Influenza,inj,Quad PF,6+ Mos 10/11/2018   PFIZER SARS-COV-2 Vaccination 01/17/2020, 02/07/2020   Pneumococcal Conjugate-13 10/16/2014   Pneumococcal-Unspecified 02/03/2011   Td 07/26/2005, 08/07/2018   Zoster 12/27/2006   Last Colon -  04/2012 - Dr Arty Baumgartner - recc no f/u due to age   Past Surgical History:  Procedure Laterality Date   COLONOSCOPY     x2   INGUINAL HERNIA REPAIR  01/15/2013   Procedure: HERNIA REPAIR INGUINAL ADULT;  Surgeon: Gwenyth Ober, MD;  Location: [facility];  Service: General;  Laterality: Right;   [place] or 2000   due to cancer   TONSILLECTOMY     as a child    Family History  Problem Relation Age of Onset   Other Mother        bowel infarction   COPD Father    Heart disease Father    Autism Son    Social History   Socioeconomic History   Marital status: Married    Spouse name: Vaughan Basta   Number of children: 2 sons with Autism who lives in a group home   Years of education: college  Occupational History   Occupation: Retired [id number] - [place] [employer]   Smoking status: Former Smoker    Packs/day: 1.00    Years: 40.00    Pack years: 40.00    Quit date: 01/10/1999    Years since quitting: 21.8   Smokeless tobacco: Never Used  Substance and Sexual Activity   Alcohol use: No    Comment: occasional   Drug use: No   Sexual activity: Not on file  Social History Narrative   Right handed    Caffeine use: coffee daily   Lives with wife, Vaughan Basta and his son [name])    ROS Constitutional: Denies fever, chills, weight loss/gain, headaches, insomnia,  night sweats or change in appetite. Does c/o fatigue. Eyes: Denies redness, blurred vision, diplopia, discharge, itchy or watery eyes.  ENT: Denies discharge, congestion, post nasal drip, epistaxis, sore throat, earache, hearing loss, dental pain, Tinnitus, Vertigo, Sinus pain or snoring.  Cardio: Denies chest pain, palpitations, irregular heartbeat, syncope, dyspnea, diaphoresis, orthopnea, PND, claudication or edema Respiratory: denies cough, dyspnea, DOE, pleurisy, hoarseness, laryngitis or wheezing.  Gastrointestinal: Denies dysphagia, heartburn, reflux, water brash, pain, cramps, nausea, vomiting, bloating, diarrhea, constipation, hematemesis, melena, hematochezia, jaundice or hemorrhoids Genitourinary: Denies dysuria, frequency, discharge, hematuria or flank pain. Has urgency, nocturia x 2-3 & occasional hesitancy. Musculoskeletal: Denies arthralgia, myalgia, stiffness, Jt. Swelling, pain, limp or strain/sprain. Denies Falls. Skin: Denies puritis,  rash, hives, warts, acne, eczema or change in skin lesion Neuro: No weakness, tremor, incoordination, spasms, paresthesia or pain Psychiatric: Denies confusion, memory loss or sensory loss. Denies Depression. Endocrine: Denies change in weight, skin, hair change, nocturia, and paresthesia, diabetic polys, visual blurring or hyper / hypo glycemic episodes.  Heme/Lymph: No excessive bleeding, bruising or enlarged lymph nodes.  Physical Exam  BP 120/70    Pulse (!) 58    Temp (!) 97 F (36.1 C)    Ht 5\' 9"  (1.753 m)    Wt 157 lb (71.2 kg)    SpO2 99%    BMI 23.18 kg/m   General Appearance: Well nourished and well  groomed and in no apparent distress.  Eyes: PERRLA, EOMs, conjunctiva no swelling or erythema, normal fundi and vessels. Sinuses: No frontal/maxillary tenderness ENT/Mouth: EACs patent / TMs  nl. Nares clear without erythema, swelling, mucoid exudates. Oral hygiene is good. No erythema, swelling, or exudate. Tongue normal, non-obstructing. Tonsils not swollen or erythematous. Hearing normal.  Neck: Supple, thyroid not palpable. No bruits, nodes or JVD. Respiratory: Respiratory effort normal.  BS equal and clear bilateral without rales, rhonci, wheezing or stridor. Cardio: Heart sounds are normal with regular rate and rhythm and no murmurs, rubs or gallops. Peripheral pulses are normal and equal bilaterally without edema. No aortic or femoral bruits. Chest: symmetric with normal excursions and percussion.  Abdomen: Soft, with Nl bowel sounds. Nontender, no guarding, rebound, hernias, masses, or organomegaly.  Lymphatics: Non tender without lymphadenopathy.  Musculoskeletal: Full ROM all peripheral extremities, joint stability, 5/5 strength, and normal gait. Skin: Warm and dry without lesions, cyanosis, clubbing or  ecchymosis. There is a generalized morbilliform serpiginous rash over the trunk.  Neuro: Cranial nerves intact, reflexes equal bilaterally. Normal muscle tone, no cerebellar  symptoms. Sensation intact.  Pysch: Alert and oriented X 3 with normal affect, insight and judgment appropriate.   Assessment and Plan  1. Labile hypertension  - EKG 12-Lead - [place], RETROPERITNL ABD,  LTD - Urinalysis, Routine w reflex microscopic - Microalbumin / creatinine urine ratio - CBC with Differential/Platelet - COMPLETE METABOLIC PANEL WITH GFR - Magnesium - TSH  2. Hyperlipidemia, mixed  - EKG 12-Lead - [place], RETROPERITNL ABD,  LTD - Lipid panel - TSH  3. Abnormal glucose  - EKG 12-Lead - [place], RETROPERITNL ABD,  LTD - Hemoglobin A1c - Insulin, random  4. 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 Screening for colorectal cancer  - POC Hemoccult Bld/Stl   7. History of prostate cancer  - PSA  8. Screening for ischemic heart disease  - EKG 12-Lead  9. FHx: heart disease  - EKG 12-Lead - [place], RETROPERITNL ABD,  LTD  10. Former smoker  - EKG 12-Lead - [place], RETROPERITNL ABD,  LTD  11. Screening for AAA (aortic abdominal aneurysm)  - [place], RETROPERITNL ABD,  LTD  12. Medication management  - Urinalysis, Routine w reflex microscopic - Microalbumin / creatinine urine ratio - CBC with Differential/Platelet - COMPLETE METABOLIC PANEL WITH GFR - Magnesium - Lipid panel - TSH - Hemoglobin A1c - Insulin, random - VITAMIN D 25 Hydroxy  13. Rash  - Dermatology referral for consultation          Patient was counseled in prudent diet, weight control to achieve/maintain BMI less than 25, BP monitoring, regular exercise and medications as discussed.  Discussed med effects and SE's. Routine screening labs and tests as requested with regular follow-up as recommended. Over 40 minutes of exam, counseling, chart review and high complex critical decision making was performed   Kirtland Bouchard, MD .

## 2020-11-26 LAB — INSULIN, RANDOM: Insulin: 78.8 u[IU]/mL — ABNORMAL HIGH

## 2020-11-26 LAB — MICROALBUMIN / CREATININE URINE RATIO
Creatinine, Urine: 98 mg/dL (ref 20–320)
Microalb Creat Ratio: 66 mcg/mg creat — ABNORMAL HIGH (ref <30)
Microalb, Ur: 6.5 mg/dL

## 2020-11-26 LAB — COMPLETE METABOLIC PANEL WITH GFR
AG Ratio: 1.4 (calc) (ref 1.0–2.5)
ALT: 30 U/L (ref 9–46)
AST: 20 U/L (ref 10–35)
Albumin: 3.6 g/dL (ref 3.6–5.1)
Alkaline phosphatase (APISO): 65 U/L (ref 35–144)
BUN: 21 mg/dL (ref 7–25)
CO2: 27 mmol/L (ref 20–32)
Calcium: 8.8 mg/dL (ref 8.6–10.3)
Chloride: 104 mmol/L (ref 98–110)
Creat: 1.08 mg/dL (ref 0.70–1.11)
GFR, Est African American: 74 mL/min/{1.73_m2} (ref >=60)
GFR, Est Non African American: 64 mL/min/{1.73_m2} (ref >=60)
Globulin: 2.6 g/dL (calc) (ref 1.9–3.7)
Glucose, Bld: 113 mg/dL — ABNORMAL HIGH (ref 65–99)
Potassium: 4.6 mmol/L (ref 3.5–5.3)
Sodium: 141 mmol/L (ref 135–146)
Total Bilirubin: 0.5 mg/dL (ref 0.2–1.2)
Total Protein: 6.2 g/dL (ref 6.1–8.1)

## 2020-11-26 LAB — URINALYSIS, ROUTINE W REFLEX MICROSCOPIC
Bacteria, UA: NONE SEEN /HPF
Bilirubin Urine: NEGATIVE
Glucose, UA: NEGATIVE
Hgb urine dipstick: NEGATIVE
Hyaline Cast: NONE SEEN /LPF
Ketones, ur: NEGATIVE
Leukocytes,Ua: NEGATIVE
Nitrite: NEGATIVE
RBC / HPF: NONE SEEN /HPF (ref 0–2)
Specific Gravity, Urine: 1.015 (ref 1.001–1.03)
Squamous Epithelial / HPF: NONE SEEN /HPF (ref <=5)
WBC, UA: NONE SEEN /HPF (ref 0–5)
pH: <=5 (ref 5.0–8.0)

## 2020-11-26 LAB — CBC WITH DIFFERENTIAL/PLATELET
Absolute Monocytes: 602 cells/uL (ref 200–950)
Basophils Absolute: 28 cells/uL (ref 0–200)
Basophils Relative: 0.3 %
Eosinophils Absolute: 545 cells/uL — ABNORMAL HIGH (ref 15–500)
Eosinophils Relative: 5.8 %
HCT: 44.7 % (ref 38.5–50.0)
Hemoglobin: 14.8 g/dL (ref 13.2–17.1)
Lymphs Abs: 987 cells/uL (ref 850–3900)
MCH: 31 pg (ref 27.0–33.0)
MCHC: 33.1 g/dL (ref 32.0–36.0)
MCV: 93.5 fL (ref 80.0–100.0)
MPV: 10.3 fL (ref 7.5–12.5)
Monocytes Relative: 6.4 %
Neutro Abs: 7238 cells/uL (ref 1500–7800)
Neutrophils Relative %: 77 %
Platelets: 189 10*3/uL (ref 140–400)
RBC: 4.78 10*6/uL (ref 4.20–5.80)
RDW: 13.4 % (ref 11.0–15.0)
Total Lymphocyte: 10.5 %
WBC: 9.4 10*3/uL (ref 3.8–10.8)

## 2020-11-26 LAB — HEMOGLOBIN A1C
Hgb A1c MFr Bld: 5.6 % of total Hgb (ref <5.7)
Mean Plasma Glucose: 114 (calc)
eAG (mmol/L): 6.3 (calc)

## 2020-11-26 LAB — MAGNESIUM: Magnesium: 2.3 mg/dL (ref 1.5–2.5)

## 2020-11-26 LAB — TSH: TSH: 3.52 mIU/L (ref 0.40–4.50)

## 2020-11-26 LAB — LIPID PANEL
Cholesterol: 197 mg/dL (ref <200)
HDL: 53 mg/dL (ref >=40)
LDL Cholesterol (Calc): 124 mg/dL (calc) — ABNORMAL HIGH
Non-HDL Cholesterol (Calc): 144 mg/dL (calc) — ABNORMAL HIGH (ref <130)
Total CHOL/HDL Ratio: 3.7 (calc) (ref <5.0)
Triglycerides: 96 mg/dL (ref <150)

## 2020-11-26 LAB — VITAMIN D 25 HYDROXY (VIT D DEFICIENCY, FRACTURES): Vit D, 25-Hydroxy: 45 ng/mL (ref 30–100)

## 2020-11-26 LAB — PSA: PSA: 0.05 ng/mL (ref <=4.0)

## 2020-11-26 NOTE — Progress Notes (Signed)
========================================================== -   Test results slightly outside the reference range are not unusual. If there is anything important, I will review this with you,  otherwise it is considered normal test values.  If you have further questions,  please do not hesitate to contact me at the office or via My Chart.  ==========================================================  -  PSA - very Low - Great  ==========================================================  -  Total Chol = 197 - elevated  (Ideal or goal is less than 180)  - and  - Bad /Dangerous LDL Chol = 124 - Very very elevated  (Ideal or Goal is less than 70  !  )  - Are you  still taking your Lipitor  /Atorvastatin ?  ==========================================================  -  A1c - Normal - No Diabetes - Great  ==========================================================  -  Vitamin D = 45  - is Very Low   - Vitamin D goal is between 70-100.   $$$$$$$$$$$$$$$$$$$$$$$$$$$$$$$$$$$$$$$$$$$$$$$$$$$$$$$$$$$$$  - Please INCREASE your  Vitamin D dose by adding  5,000 units more to your current dose.   $$$$$$$$$$$$$$$$$$$$$$$$$$$$$$$$$$$$$$$$$$$$$$$$$$$$$$$$$$$$$  - It is very important as a natural anti-inflammatory and helping the  immune system protect against viral infections, like the Covid-19    helping hair, skin, and nails, as well as reducing stroke and  heart attack risk.   - It helps your bones and helps with mood.  - It also decreases numerous cancer risks so please  take it as directed.   - Low Vit D is associated with a 200-300% higher risk for  CANCER   and 200-300% higher risk for HEART   ATTACK  &  STROKE.    - It is also associated with higher death rate at younger ages,   autoimmune diseases like Rheumatoid arthritis, Lupus,  Multiple Sclerosis.     - Also many other serious conditions, like depression, Alzheimer's  Dementia, infertility, muscle aches, fatigue,  fibromyalgia   - just to name a few. ==========================================================  - All Else - CBC - Kidneys - Electrolytes - Liver - Magnesium & Thyroid    - all  Normal / OK ==========================================================

## 2020-11-28 ENCOUNTER — Other Ambulatory Visit: Payer: Self-pay | Admitting: Internal Medicine

## 2020-11-28 MED ORDER — DEXAMETHASONE 4 MG PO TABS: ORAL_TABLET | ORAL | 0 refills | Status: DC

## 2020-12-02 ENCOUNTER — Other Ambulatory Visit: Payer: Self-pay | Admitting: Internal Medicine

## 2021-01-06 DIAGNOSIS — L309 Dermatitis, unspecified: Secondary | ICD-10-CM | POA: Diagnosis not present

## 2021-01-06 DIAGNOSIS — L111 Transient acantholytic dermatosis [Grover]: Secondary | ICD-10-CM | POA: Diagnosis not present

## 2021-01-06 DIAGNOSIS — Z85828 Personal history of other malignant neoplasm of skin: Secondary | ICD-10-CM | POA: Diagnosis not present

## 2021-01-06 DIAGNOSIS — L821 Other seborrheic keratosis: Secondary | ICD-10-CM | POA: Diagnosis not present

## 2021-01-24 ENCOUNTER — Emergency Department (HOSPITAL_COMMUNITY)
Admission: EM | Admit: 2021-01-24 | Discharge: 2021-01-25 | Disposition: A | Discharge status: Home / Self Care | Payer: Medicare Other | Attending: Emergency Medicine | Admitting: Emergency Medicine

## 2021-01-24 ENCOUNTER — Other Ambulatory Visit: Payer: Self-pay

## 2021-01-24 DIAGNOSIS — I10 Essential (primary) hypertension: Secondary | ICD-10-CM | POA: Diagnosis not present

## 2021-01-24 DIAGNOSIS — J449 Chronic obstructive pulmonary disease, unspecified: Secondary | ICD-10-CM | POA: Insufficient documentation

## 2021-01-24 DIAGNOSIS — Z8546 Personal history of malignant neoplasm of prostate: Secondary | ICD-10-CM | POA: Insufficient documentation

## 2021-01-24 DIAGNOSIS — Z7982 Long term (current) use of aspirin: Secondary | ICD-10-CM | POA: Insufficient documentation

## 2021-01-24 DIAGNOSIS — R000 Tachycardia, unspecified: Secondary | ICD-10-CM | POA: Diagnosis not present

## 2021-01-24 DIAGNOSIS — L509 Urticaria, unspecified: Secondary | ICD-10-CM | POA: Insufficient documentation

## 2021-01-24 DIAGNOSIS — Z87891 Personal history of nicotine dependence: Secondary | ICD-10-CM | POA: Diagnosis not present

## 2021-01-24 DIAGNOSIS — Z79899 Other long term (current) drug therapy: Secondary | ICD-10-CM | POA: Insufficient documentation

## 2021-01-24 DIAGNOSIS — R531 Weakness: Secondary | ICD-10-CM | POA: Diagnosis not present

## 2021-01-24 DIAGNOSIS — F0150 Vascular dementia without behavioral disturbance: Secondary | ICD-10-CM | POA: Diagnosis not present

## 2021-01-24 DIAGNOSIS — R29810 Facial weakness: Secondary | ICD-10-CM | POA: Diagnosis not present

## 2021-01-24 DIAGNOSIS — R21 Rash and other nonspecific skin eruption: Secondary | ICD-10-CM

## 2021-01-24 LAB — CBC WITH DIFFERENTIAL/PLATELET
Abs Immature Granulocytes: 0.04 10*3/uL (ref 0.00–0.07)
Basophils Absolute: 0.1 10*3/uL (ref 0.0–0.1)
Basophils Relative: 1 %
Eosinophils Absolute: 0.5 10*3/uL (ref 0.0–0.5)
Eosinophils Relative: 5 %
HCT: 44.7 % (ref 39.0–52.0)
Hemoglobin: 14.4 g/dL (ref 13.0–17.0)
Immature Granulocytes: 0 %
Lymphocytes Relative: 17 %
Lymphs Abs: 1.6 10*3/uL (ref 0.7–4.0)
MCH: 31.2 pg (ref 26.0–34.0)
MCHC: 32.2 g/dL (ref 30.0–36.0)
MCV: 97 fL (ref 80.0–100.0)
Monocytes Absolute: 1 10*3/uL (ref 0.1–1.0)
Monocytes Relative: 10 %
Neutro Abs: 6.3 10*3/uL (ref 1.7–7.7)
Neutrophils Relative %: 67 %
Platelets: 250 10*3/uL (ref 150–400)
RBC: 4.61 MIL/uL (ref 4.22–5.81)
RDW: 14.7 % (ref 11.5–15.5)
WBC: 9.5 10*3/uL (ref 4.0–10.5)
nRBC: 0 % (ref 0.0–0.2)

## 2021-01-24 LAB — BASIC METABOLIC PANEL
Anion gap: 8 (ref 5–15)
BUN: 16 mg/dL (ref 8–23)
CO2: 24 mmol/L (ref 22–32)
Calcium: 7.1 mg/dL — ABNORMAL LOW (ref 8.9–10.3)
Chloride: 110 mmol/L (ref 98–111)
Creatinine, Ser: 0.78 mg/dL (ref 0.61–1.24)
GFR, Estimated: >60 mL/min (ref >60)
Glucose, Bld: 79 mg/dL (ref 70–99)
Potassium: 3.4 mmol/L — ABNORMAL LOW (ref 3.5–5.1)
Sodium: 142 mmol/L (ref 135–145)

## 2021-01-24 MED ORDER — FAMOTIDINE IN NACL 20-0.9 MG/50ML-% IV SOLN
20.0000 mg | INTRAVENOUS | Status: AC
  Administered 2021-01-24: 20 mg via INTRAVENOUS
  Filled 2021-01-24: qty 50

## 2021-01-24 MED ORDER — METHYLPREDNISOLONE SODIUM SUCC 125 MG IJ SOLR
125.0000 mg | Freq: Once | INTRAMUSCULAR | Status: AC
  Administered 2021-01-24: 125 mg via INTRAVENOUS
  Filled 2021-01-24: qty 2

## 2021-01-24 MED ORDER — PREDNISONE 20 MG PO TABS: ORAL_TABLET | ORAL | 0 refills | Status: DC

## 2021-01-24 NOTE — ED Triage Notes (Signed)
Pt arrived via EMS from home, pt called EMS due to lightheadedness, dizziness and full-body hives that have been developing for 5 days. No SOB or airway issues. Pt does not report having any known allergies.

## 2021-01-24 NOTE — ED Provider Notes (Signed)
[facility] DEPT Provider Note   CSN: [id number] Arrival date & time: 01/24/21  2142     History Chief Complaint  Patient presents with  . Urticaria    Brian Proctor is a 85 y.o. male.  The history is provided by the patient and medical records.  Urticaria    85 y.o. M with history of GERD, prostate cancer, HLP, HTN, Vit D deficiency, presenting to the ED for rash.  States this has been present for about a month now but really started bothering him over the past week or so.  States at time the itching will "drive him crazy".  States he does not feel "bad" but maybe slightly off.  He has not had any chest pain, SOB, trouble breathing, sensation of lip/tongue swelling, or trouble swallowing.  No fever, chills, recent illness.  States he has no idea what caused the rash.  Denies known changes in soaps, detergents, or other personal care products.  None of his home medications have changed.  Given 50mg  benadryl by EMS.  Past Medical History:  Diagnosis Date  . Cancer [facility])    prostate - basal cell  . GERD (gastroesophageal reflux disease)   . Hearing loss    wear hearing aid  . Hyperlipidemia   . Hypertension   . Inguinal hernia   . Memory loss   . Prediabetes   . Vitamin D deficiency   . Wears glasses   . Wears hearing aid    both ears    Patient Active Problem List   Diagnosis Date Noted  . Vascular dementia without behavioral disturbance ([place]) 08/14/2020  . Mild cognitive impairment 03/11/2020  . Memory changes 02/04/2020  . Abnormal glucose 04/11/2019  . COPD (chronic obstructive pulmonary disease) ([place]) 12/11/2018  . Former smoker 03/02/2018  . FH: heart disease 03/02/2018  . History of basal cell carcinoma 11/14/2017  . BMI 22.0-22.9, adult 12/06/2015  . Encounter for Medicare annual wellness exam 09/03/2015  . Medication management 07/03/2014  . History of prostate cancer 07/03/2014  . Hyperlipidemia, mixed   . Essential  hypertension   . GERD (gastroesophageal reflux disease)   . Vitamin D deficiency     Past Surgical History:  Procedure Laterality Date  . COLONOSCOPY     x2  . INGUINAL HERNIA REPAIR  01/15/2013   Procedure: HERNIA REPAIR INGUINAL ADULT;  Surgeon: Gwenyth Ober, MD;  Location: [facility];  Service: General;  Laterality: Right;  . [place] or 2000   due to cancer  . TONSILLECTOMY     as a child       Family History  Problem Relation Age of Onset  . Other Mother        bowel infarction  . COPD Father   . Heart disease Father   . Autism Son     Social History   Tobacco Use  . Smoking status: Former Smoker    Packs/day: 1.00    Years: 40.00    Pack years: 40.00    Quit date: 01/10/1999    Years since quitting: 22.0  . Smokeless tobacco: Never Used  Substance Use Topics  . Alcohol use: No    Comment: occasional  . Drug use: No    Home Medications Prior to Admission medications   Medication Sig Start Date End Date Taking? Authorizing Provider  Ascorbic Acid (VITAMIN C PO) Take by mouth daily.    [provider]  aspirin 81  MG tablet Take 81 mg by mouth daily.    [provider]  atorvastatin (LIPITOR) 40 MG tablet Take     1 tablet     Daily        for Cholesterol 12/02/20   Unk Pinto, MD  Cholecalciferol (VITAMIN D PO) Take by mouth daily.    [provider]  CINNAMON PO Take 1,000 mg by mouth daily.    [provider]  Coenzyme Q10 200 MG TABS Take 200 mg by mouth daily.    [provider]  dexamethasone (DECADRON) 4 MG tablet Take 1 tab 3 x day - 3 days, then 2 x day - 3 days, then 1 tab daily 11/28/20   Unk Pinto, MD  fish oil-omega-3 fatty acids 1000 MG capsule Take 2 g by mouth daily.    [provider]  Flaxseed, Linseed, (FLAXSEED OIL PO) Take 1,000 mg by mouth daily.    [provider]  hydrocortisone (PROCTO-MED HC) 2.5 % rectal cream User 3 to 4 x / day as  needed 11/24/18   Unk Pinto, MD  hydrocortisone-pramoxine [facility]) 2.5-1 % rectal cream Apply rectally 1-3 times daily as needed for hemorrhoid flare 02/04/20   Liane Comber, NP  Magnesium 250 MG TABS Take 250 mg by mouth daily.    [provider]  memantine (NAMENDA) 10 MG tablet Take 1 tablet (10 mg total) by mouth 2 (two) times daily. For memory. 08/14/20   Liane Comber, NP  Multiple Vitamins-Minerals (MULTIVITAMIN PO) Take by mouth daily.    [provider]  Multiple Vitamins-Minerals (ZINC PO) Take by mouth.    [provider]  pseudoephedrine (SUDAFED) 30 MG tablet Take 30 mg by mouth every 4 (four) hours as needed for congestion.    [provider]  triamcinolone ointment (KENALOG) 0.1 % Apply 1 application topically 2 (two) times daily. 08/06/19   Liane Comber, NP  vitamin C (ASCORBIC ACID) 500 MG tablet Take 500 mg by mouth daily.    [provider]    Allergies    Patient has no known allergies.  Review of Systems   Review of Systems  Skin: Positive for rash.  All other systems reviewed and are negative.   Physical Exam Updated Vital Signs BP (!) 150/89 (BP Location: Right Arm)   Pulse 71   Temp 98.5 F (36.9 C)   Resp 15   Ht 5\' 9"  (1.753 m)   Wt 68 kg   SpO2 100%   BMI 22.15 kg/m   Physical Exam Vitals and nursing note reviewed.  Constitutional:      Appearance: He is well-developed and well-nourished.     Comments: Elderly, hard of hearing  HENT:     Head: Normocephalic and atraumatic.     Mouth/Throat:     Mouth: Oropharynx is clear and moist.     Comments: No lip/tongue swelling, handling secretions well, no stridor Eyes:     Extraocular Movements: EOM normal.     Conjunctiva/sclera: Conjunctivae normal.     Pupils: Pupils are equal, round, and reactive to light.  Cardiovascular:     Rate and Rhythm: Normal rate and regular rhythm.     Heart sounds: Normal heart sounds.  Pulmonary:      Effort: Pulmonary effort is normal. No respiratory distress.     Breath sounds: Normal breath sounds. No rhonchi.  Abdominal:     General: Bowel sounds are normal.     Palpations: Abdomen is soft.  Tenderness: There is no abdominal tenderness. There is no rebound.  Musculoskeletal:        General: Normal range of motion.     Cervical back: Normal range of motion.  Skin:    General: Skin is warm and dry.     Comments: Splotchy appearing rash with intermixed urticaria noted to the upper arms, chest, and back, some areas of excoriation noted but no signs of superimposed infection or cellulitis  Neurological:     Mental Status: He is alert and oriented to person, place, and time.  Psychiatric:        Mood and Affect: Mood and affect normal.     ED Results / Procedures / Treatments   Labs (all labs ordered are listed, but only abnormal results are displayed) Labs Reviewed  BASIC METABOLIC PANEL - Abnormal; Notable for the following components:      Result Value   Potassium 3.4 (*)    Calcium 7.1 (*)    All other components within normal limits  CBC WITH DIFFERENTIAL/PLATELET    EKG None  Radiology No results found.  Procedures Procedures   Medications Ordered in ED Medications  methylPREDNISolone sodium succinate (SOLU-MEDROL) 125 mg/2 mL injection 125 mg (125 mg Intravenous Given 01/24/21 2246)  famotidine (PEPCID) IVPB 20 mg premix (20 mg Intravenous New Bag/Given 01/24/21 2254)    ED Course  I have reviewed the triage vital signs and the nursing notes.  Pertinent labs & imaging results that were available during my care of the patient were reviewed by me and considered in my medical decision making (see chart for details).    MDM Rules/Calculators/A&P  85 y.o. M here with rash for the past month but bothering him more over the past 4 days.  On chart review, it actually appearing he has been having issues with rash since November 2021.  He is uncertain of the  etiology.  He denies any changes in soap or detergent.  No new medications.  He was given 50 mg Benadryl with vast improvement of the itching.  He denies any fever or other infectious symptoms.  No lip or tongue swelling.  Rash is somewhat splotchy in appearance with intermixed hives.  No signs of superimposed infection or cellulitis.  This is present across entire torso and not consistent with shingles.  No signs/symptoms concerning for TENS or SJS.  Screening labs sent and are reassuring.  He was given additional solu-medrol and pepcid here with reported further improvement.  Hives seem to have resolved but other rash still present.  Wife now at bedside, states they have already seen dermatology for this with unknown diagnosis.  He has completed a few courses of prednisone taper with transient relief.  They would prefer second opinion so will refer to back to dermatology.  He wants to try another short prednisone taper which seems reasonable.  Can continue benadryl/pepcid PRN itching.  Advised wife to use with caution, however he seemed to tolerate large dose by EMS without drowsiness/sedative effect.  He may return here for any new/acute changes.  Final Clinical Impression(s) / ED Diagnoses Final diagnoses:  Rash    Rx / DC Orders ED Discharge Orders         Ordered    predniSONE (DELTASONE) 20 MG tablet        01/24/21 2346           Larene Pickett, PA-C 01/25/21 0025    Merryl Hacker, MD 01/25/21 [phone]

## 2021-01-24 NOTE — Discharge Instructions (Signed)
Take the prescribed medication as directed.  Can use benadryl and pepcid for itching as needed.  Be careful with benadryl, can make you sleepy/drowsy. Follow-up with Dr. Allyson Sabal-- call his office Monday for appointment. Return to the ED for new or worsening symptoms.

## 2021-01-29 DIAGNOSIS — L821 Other seborrheic keratosis: Secondary | ICD-10-CM | POA: Diagnosis not present

## 2021-01-29 DIAGNOSIS — L111 Transient acantholytic dermatosis [Grover]: Secondary | ICD-10-CM | POA: Diagnosis not present

## 2021-01-29 DIAGNOSIS — D485 Neoplasm of uncertain behavior of skin: Secondary | ICD-10-CM | POA: Diagnosis not present

## 2021-01-29 DIAGNOSIS — L298 Other pruritus: Secondary | ICD-10-CM | POA: Diagnosis not present

## 2021-02-04 DIAGNOSIS — L570 Actinic keratosis: Secondary | ICD-10-CM | POA: Diagnosis not present

## 2021-02-04 DIAGNOSIS — C4449 Other specified malignant neoplasm of skin of scalp and neck: Secondary | ICD-10-CM | POA: Diagnosis not present

## 2021-02-04 DIAGNOSIS — L819 Disorder of pigmentation, unspecified: Secondary | ICD-10-CM | POA: Diagnosis not present

## 2021-02-04 DIAGNOSIS — L821 Other seborrheic keratosis: Secondary | ICD-10-CM | POA: Diagnosis not present

## 2021-02-04 DIAGNOSIS — D485 Neoplasm of uncertain behavior of skin: Secondary | ICD-10-CM | POA: Diagnosis not present

## 2021-02-04 DIAGNOSIS — L298 Other pruritus: Secondary | ICD-10-CM | POA: Diagnosis not present

## 2021-02-04 DIAGNOSIS — C4442 Squamous cell carcinoma of skin of scalp and neck: Secondary | ICD-10-CM | POA: Diagnosis not present

## 2021-02-10 DIAGNOSIS — G301 Alzheimer's disease with late onset: Secondary | ICD-10-CM | POA: Diagnosis not present

## 2021-02-10 DIAGNOSIS — F0280 Dementia in other diseases classified elsewhere without behavioral disturbance: Secondary | ICD-10-CM | POA: Diagnosis not present

## 2021-02-10 DIAGNOSIS — F0390 Unspecified dementia without behavioral disturbance: Secondary | ICD-10-CM | POA: Diagnosis not present

## 2021-02-25 ENCOUNTER — Encounter: Payer: Self-pay | Admitting: Adult Health Nurse Practitioner

## 2021-02-25 ENCOUNTER — Ambulatory Visit (INDEPENDENT_AMBULATORY_CARE_PROVIDER_SITE_OTHER): Payer: Medicare Other | Admitting: Adult Health Nurse Practitioner

## 2021-02-25 ENCOUNTER — Other Ambulatory Visit: Payer: Self-pay

## 2021-02-25 VITALS — BP 120/82 | HR 66 | Temp 97.3°F | Ht 69.0 in | Wt 151.0 lb

## 2021-02-25 DIAGNOSIS — I10 Essential (primary) hypertension: Secondary | ICD-10-CM | POA: Diagnosis not present

## 2021-02-25 DIAGNOSIS — R7309 Other abnormal glucose: Secondary | ICD-10-CM | POA: Diagnosis not present

## 2021-02-25 DIAGNOSIS — Z0001 Encounter for general adult medical examination with abnormal findings: Secondary | ICD-10-CM

## 2021-02-25 DIAGNOSIS — Z8546 Personal history of malignant neoplasm of prostate: Secondary | ICD-10-CM | POA: Diagnosis not present

## 2021-02-25 DIAGNOSIS — E559 Vitamin D deficiency, unspecified: Secondary | ICD-10-CM

## 2021-02-25 DIAGNOSIS — Z87891 Personal history of nicotine dependence: Secondary | ICD-10-CM

## 2021-02-25 DIAGNOSIS — R6889 Other general symptoms and signs: Secondary | ICD-10-CM | POA: Diagnosis not present

## 2021-02-25 DIAGNOSIS — Z6822 Body mass index (BMI) 22.0-22.9, adult: Secondary | ICD-10-CM

## 2021-02-25 DIAGNOSIS — K219 Gastro-esophageal reflux disease without esophagitis: Secondary | ICD-10-CM

## 2021-02-25 DIAGNOSIS — Z0000 Encounter for general adult medical examination without abnormal findings: Secondary | ICD-10-CM

## 2021-02-25 DIAGNOSIS — R413 Other amnesia: Secondary | ICD-10-CM

## 2021-02-25 DIAGNOSIS — E782 Mixed hyperlipidemia: Secondary | ICD-10-CM | POA: Diagnosis not present

## 2021-02-25 DIAGNOSIS — Z85828 Personal history of other malignant neoplasm of skin: Secondary | ICD-10-CM

## 2021-02-25 LAB — CBC WITH DIFFERENTIAL/PLATELET
Absolute Monocytes: 993 cells/uL — ABNORMAL HIGH (ref 200–950)
Basophils Absolute: 44 cells/uL (ref 0–200)
Basophils Relative: 0.3 %
Eosinophils Absolute: 292 cells/uL (ref 15–500)
Eosinophils Relative: 2 %
HCT: 48.4 % (ref 38.5–50.0)
Hemoglobin: 16.2 g/dL (ref 13.2–17.1)
Lymphs Abs: 1635 cells/uL (ref 850–3900)
MCH: 30.7 pg (ref 27.0–33.0)
MCHC: 33.5 g/dL (ref 32.0–36.0)
MCV: 91.8 fL (ref 80.0–100.0)
MPV: 10.6 fL (ref 7.5–12.5)
Monocytes Relative: 6.8 %
Neutro Abs: 11636 cells/uL — ABNORMAL HIGH (ref 1500–7800)
Neutrophils Relative %: 79.7 %
Platelets: 215 10*3/uL (ref 140–400)
RBC: 5.27 10*6/uL (ref 4.20–5.80)
RDW: 13.6 % (ref 11.0–15.0)
Total Lymphocyte: 11.2 %
WBC: 14.6 10*3/uL — ABNORMAL HIGH (ref 3.8–10.8)

## 2021-02-25 LAB — COMPLETE METABOLIC PANEL WITH GFR
AG Ratio: 1.5 (calc) (ref 1.0–2.5)
ALT: 39 U/L (ref 9–46)
AST: 38 U/L — ABNORMAL HIGH (ref 10–35)
Albumin: 3.8 g/dL (ref 3.6–5.1)
Alkaline phosphatase (APISO): 62 U/L (ref 35–144)
BUN/Creatinine Ratio: 28 (calc) — ABNORMAL HIGH (ref 6–22)
BUN: 28 mg/dL — ABNORMAL HIGH (ref 7–25)
CO2: 30 mmol/L (ref 20–32)
Calcium: 9.4 mg/dL (ref 8.6–10.3)
Chloride: 103 mmol/L (ref 98–110)
Creat: 1.01 mg/dL (ref 0.70–1.11)
GFR, Est African American: 79 mL/min/{1.73_m2} (ref >=60)
GFR, Est Non African American: 68 mL/min/{1.73_m2} (ref >=60)
Globulin: 2.5 g/dL (calc) (ref 1.9–3.7)
Glucose, Bld: 68 mg/dL (ref 65–99)
Potassium: 4.7 mmol/L (ref 3.5–5.3)
Sodium: 141 mmol/L (ref 135–146)
Total Bilirubin: 1 mg/dL (ref 0.2–1.2)
Total Protein: 6.3 g/dL (ref 6.1–8.1)

## 2021-02-25 LAB — LIPID PANEL
Cholesterol: 161 mg/dL (ref <200)
HDL: 53 mg/dL (ref >=40)
LDL Cholesterol (Calc): 91 mg/dL (calc)
Non-HDL Cholesterol (Calc): 108 mg/dL (calc) (ref <130)
Total CHOL/HDL Ratio: 3 (calc) (ref <5.0)
Triglycerides: 83 mg/dL (ref <150)

## 2021-02-25 NOTE — Progress Notes (Addendum)
MEDICARE ANNUAL WELLNESS VISIT AND FOLLOW UP  Assessment:   Diagnoses and all orders for this visit:  Encounter for Medicare annual wellness exam Yearly  Essential hypertension At goal off of medications Monitor blood pressure at home; call if consistently over 130/80 Continue DASH diet.   Reminder to go to the ER if any CP, SOB, nausea, dizziness, severe HA, changes vision/speech, left arm numbness and tingling and jaw pain.  Gastroesophageal reflux disease, esophagitis presence not specified Currently well controlled with lifestyle changes - avoiding triggers, excessively large meals, lying down after eating OTC agents for rare symptoms; contact office if frequency increases  History of prostate cancer S/p prostatectomy 1999; no issues or suspected recurrence.  Wears pad for incontinence and doing well Continue to monitor.   Mixed hyperlipidemia Continue medications and supplements for LDL goal <100 Continue low cholesterol diet and exercise.  Check lipid panel.   History of elevated glucose Previously prediabetic currently well controlled with lifestyle changes Encouraged to continue with lifestyle Monitor A1C as needed; defer today, check CMP  Vitamin D deficiency Continue supplementation and routine monitoring At goal at recent check; continue to recommend supplementation for goal of 60-100 Defer vitamin D level  History of basal cell carcinoma Continue follow up with derm q6 months or as recommended; wear sunscreen when outdoors and reapply regularly.  Body mass index (BMI) of 22.0-22.9 in adult Rec. Maintain weight with balanced diet and exercise  Former smoker/40+ pack year hx No sx, last cxr 2019, no sx, declines imaging at this time  Memory changes Had normal basic labs, smoker 40+ pack year, had MRI suggestive of microvascular changes Declined namenda/aricept, discussed today, patient would like to discuss with neurology Continue ASA, statin     Will  call with lab results: patient / wife  Over 30 minutes of face to face interview, exam, counseling, chart review, and critical decision making was performed  Future Appointments  Date Time Provider [place]  06/01/2021  9:30 AM Unk Pinto, MD [facility] None  12/08/2021 11:00 AM Unk Pinto, MD [facility] None  02/25/2022  9:30 AM Garnet Sierras, NP [facility] None     Plan:   During the course of the visit the patient was educated and counseled about appropriate screening and preventive services including:    Pneumococcal vaccine   Influenza vaccine  Prevnar 13  Td vaccine  Screening electrocardiogram  Colorectal cancer screening  Diabetes screening  Glaucoma screening  Nutrition counseling    Subjective:  Brian Proctor is a 85 y.o. male who presents for Medicare Annual Wellness Visit and 3 month follow up for HTN, HLD, history prediabetes, and vitamin D Def.   S/p prostatectomy for CA in 1999 - has incontinence for which he wears a pad.   He has hx of basal cell skin CA for which he sees dermatology q40m for monitoring.   GERD well controlled by lifestyle modification; uses alka-seltzer for rare symptoms. He does endorse increased belching but denies other symptoms. He feels well controlled with current regimen.   He has reported ongoing gradual short term memory loss, had MRI in 06/2019 for these reports which showed microvascular changes. Declined namenda/aricept at that time, had basic labs at that time including b12 which were normal, today repots interest in seeing a neurologist to discuss further.   BMI is Body mass index is 22.3 kg/m., he has been working on diet and exercise. His various medical conditions are very well controlled - most managed by lifestyle modification.  He continues to walk 60 min 3-4 times a week without issues, golfs and does regular yard work.  Wt Readings from Last 3 Encounters:  02/25/21 151 lb (68.5 kg)  01/24/21  150 lb (68 kg)  11/25/20 157 lb (71.2 kg)   His blood pressure has been controlled at home, today their BP is BP: 120/82 He does workout. He denies chest pain, shortness of breath, dizziness.   He is on cholesterol medication (atorvastatatin 40 mg daily), on omega 3 supplement and lifestyle changes. His cholesterol is at goal. The cholesterol last visit was:  Lab Results  Component Value Date   CHOL 197 11/25/2020   HDL 53 11/25/2020   LDLCALC 124 (H) 11/25/2020   TRIG 96 11/25/2020   CHOLHDL 3.7 11/25/2020   He has been working on diet and exercise for history of prediabetes, and denies increased appetite, nausea, paresthesia of the feet, polydipsia, polyuria and visual disturbances. Last A1C in the office was:  Lab Results  Component Value Date   HGBA1C 5.6 11/25/2020   Last GFR Lab Results  Component Value Date   GFRNONAA >60 01/24/2021    Patient is on Vitamin D supplement.   Lab Results  Component Value Date   VD25OH 45 11/25/2020      Medication Review: Current Outpatient Medications on File Prior to Visit  Medication Sig Dispense Refill  . Ascorbic Acid (VITAMIN C PO) Take by mouth daily.    [name] aspirin 81 MG tablet Take 81 mg by mouth daily.    [name] atorvastatin (LIPITOR) 40 MG tablet Take     1 tablet     Daily        for Cholesterol 90 tablet 0  . Cholecalciferol (VITAMIN D PO) Take by mouth daily.    [name] CINNAMON PO Take 1,000 mg by mouth daily.    . Coenzyme Q10 200 MG TABS Take 200 mg by mouth daily.    . fish oil-omega-3 fatty acids 1000 MG capsule Take 2 g by mouth daily.    . Flaxseed, Linseed, (FLAXSEED OIL PO) Take 1,000 mg by mouth daily.    . hydrocortisone (PROCTO-MED HC) 2.5 % rectal cream User 3 to 4 x / day as needed 30 g 11  . hydrocortisone-pramoxine (ANALPRAM-HC) 2.5-1 % rectal cream Apply rectally 1-3 times daily as needed for hemorrhoid flare 30 g 2  . Magnesium 250 MG TABS Take 250 mg by mouth daily.    . memantine (NAMENDA) 10 MG tablet Take 1  tablet (10 mg total) by mouth 2 (two) times daily. For memory. 60 tablet 11  . Multiple Vitamins-Minerals (MULTIVITAMIN PO) Take by mouth daily.    . Multiple Vitamins-Minerals (ZINC PO) Take by mouth.    . pseudoephedrine (SUDAFED) 30 MG tablet Take 30 mg by mouth every 4 (four) hours as needed for congestion.    . triamcinolone ointment (KENALOG) 0.1 % Apply 1 application topically 2 (two) times daily. 80 g 1  . vitamin C (ASCORBIC ACID) 500 MG tablet Take 500 mg by mouth daily.     No current facility-administered medications on file prior to visit.    Allergies: No Known Allergies  Current Problems (verified) has Hyperlipidemia, mixed; Essential hypertension; GERD (gastroesophageal reflux disease); Vitamin D deficiency; Medication management; History of prostate cancer; Encounter for Medicare annual wellness exam; BMI 22.0-22.9, adult; History of basal cell carcinoma; Former smoker; FH: heart disease; COPD (chronic obstructive pulmonary disease) ([place]); Abnormal glucose; Memory changes; Mild cognitive impairment; and Vascular dementia  without behavioral disturbance ([place]) on their problem list.  Screening Tests Immunization History  Administered Date(s) Administered  . DT (Pediatric) 09/03/2015  . Fluad Quad(high Dose 65+) 10/13/2019  . Influenza Inj Mdck Quad Pf 11/10/2018  . Influenza Split 10/28/2016  . Influenza, High Dose Seasonal PF 09/27/2014, 09/03/2015, 10/05/2017  . Influenza,inj,Quad PF,6+ Mos 10/11/2018  . PFIZER(Purple Top)SARS-COV-2 Vaccination 01/17/2020, 02/07/2020  . Pneumococcal Conjugate-13 10/16/2014  . Pneumococcal-Unspecified 02/03/2011  . Td 07/26/2005, 08/07/2018  . Zoster 12/27/2006   Preventative care: Last colonoscopy: 2015  CXR 2019 MRI brain 07/2019 microvascular changes  Prior vaccinations: TD or Tdap: 2019  Influenza: 2020 Pneumococcal: 2012 Prevnar13: 2015 Shingles/Zostavax: 2008 Names of Other Physician/Practitioners you currently use: 1.  [facility] here for primary care 2. [facility], eye doctor, last visit 2021?, wears glasses, no issues 3. [facility], dentist, last visit 2021 - goes q6 months  Sees dermatology q 6 months   Patient Care Team: Unk Pinto, MD as PCP - General (Internal Medicine)  Surgical: He  has a past surgical history that includes Tonsillectomy; Prostate surgery (1999 or 2000); Colonoscopy; and Inguinal hernia repair (01/15/2013). Family His family history includes Autism in his son; COPD in his father; Heart disease in his father; Other in his mother. Social history  He reports that he quit smoking about 22 years ago. He has a 40.00 pack-year smoking history. He has never used smokeless tobacco. He reports that he does not drink alcohol and does not use drugs.  MEDICARE WELLNESS OBJECTIVES: Physical activity: Current Exercise Habits: Home exercise routine, Type of exercise: walking, Time (Minutes): 30, Frequency (Times/Week): 5, Weekly Exercise (Minutes/Week): 150, Exercise limited by: neurologic condition(s) Cardiac risk factors: Cardiac Risk Factors include: advanced age (>34men, >71 women);dyslipidemia;smoking/ tobacco exposure;male gender Depression/mood screen:   Depression screen [facility] 2/9 02/25/2021  Decreased Interest 0  Down, Depressed, Hopeless 0  PHQ - 2 Score 0    ADLs:  In your present state of health, do you have any difficulty performing the following activities: 02/25/2021 11/24/2020  Hearing? Y N  Vision? N N  Difficulty concentrating or making decisions? Y N  Walking or climbing stairs? N N  Dressing or bathing? N N  Doing errands, shopping? N N  Preparing Food and eating ? N -  Using the Toilet? N -  In the past six months, have you accidently leaked urine? N -  Do you have problems with loss of bowel control? N -  Managing your Medications? Y -  Comment Wife assists with this -  Managing your Finances? Y -  Comment Wife  manages this -  Housekeeping or managing your Housekeeping? N -  Some recent data might be hidden     Cognitive Testing  Alert? Yes  Normal Appearance?Yes  Oriented to person? Yes  Place? Yes   Time? Yes  Recall of three objects?  No 1/3  Can perform simple calculations? Yes  Displays appropriate judgment?Yes  Can read the correct time from a watch face?Yes  EOL planning: Does Patient Have a Medical Advance Directive?: No Would patient like information on creating a medical advance directive?: No - Patient declined   Objective:   Today's Vitals   02/25/21 0931  BP: 120/82  Pulse: 66  Temp: (!) 97.3 F (36.3 C)  SpO2: 96%  Weight: 151 lb (68.5 kg)  Height: 5\' 9"  (1.753 m)  PainSc: 0-No pain   Body mass index is 22.3 kg/m.  General appearance: alert,  no distress, WD/WN, male HEENT: normocephalic, sclerae anicteric, TMs pearly, nares patent, no discharge or erythema, pharynx normal Oral cavity: MMM, no lesions Neck: supple, no lymphadenopathy, no thyromegaly, no masses Heart: RRR, normal S1, S2, no murmurs Lungs: CTA bilaterally, no wheezes, rhonchi, or rales Abdomen: +bs, soft, non tender, non distended, no masses, no hepatomegaly, no splenomegaly Musculoskeletal: nontender, no swelling, no obvious deformity Extremities: no edema, no cyanosis, no clubbing Pulses: 2+ symmetric, upper and lower extremities, normal cap refill Neurological: alert, oriented x 3, CN2-12 intact, strength normal upper extremities and lower extremities, sensation normal throughout, DTRs 2+ throughout, no cerebellar signs, gait normal. Poor short term recall.  Psychiatric: normal affect, behavior normal, pleasant  Skin: warm/dry, intact, no rashes  Medicare Attestation I have personally reviewed: The patient's medical and social history Their use of alcohol, tobacco or illicit drugs Their current medications and supplements The patient's functional ability including ADLs,fall risks, home  safety risks, cognitive, and hearing and visual impairment Diet and physical activities Evidence for depression or mood disorders  The patient's weight, height, BMI, and visual acuity have been recorded in the chart.  I have made referrals, counseling, and provided education to the patient based on review of the above and I have provided the patient with a written personalized care plan for preventive services.     Garnet Sierras, NP   02/25/2021

## 2021-02-25 NOTE — Patient Instructions (Addendum)
   Brian Proctor , Thank you for taking time to come for your Medicare Wellness Visit. I appreciate your ongoing commitment to your health goals. Please review the following plan we discussed and let me know if I can assist you in the future.   These are the goals we discussed: Goals    . Exercise 150 min/wk Moderate Activity     Incorporate weights/resistance exercises     . LDL CALC < 100       This is a list of the screening recommended for you and due dates:  Health Maintenance  Topic Date Due  . COVID-19 Vaccine (3 - Pfizer risk 4-dose series) Complete, consider booster  . Tetanus Vaccine  08/07/2028  . Flu Shot  Completed  . Pneumonia vaccines  Completed  . HPV Vaccine  Aged Out    Cut to your head, wash once a day with mild soap and water.  Apply antibiotic ointment.  Monitoring for increasing redness or bad smelling drainage or fever.  This would be signs of infection.     GENERAL HEALTH GOALS  Know what a healthy weight is for you (roughly BMI <25) and aim to maintain this  Aim for 7+ servings of fruits and vegetables daily  70-80+ fluid ounces of water or unsweet tea for healthy kidneys  Limit to max 1 drink of alcohol per day; avoid smoking/tobacco  Limit animal fats in diet for cholesterol and heart health - choose grass fed whenever available  Avoid highly processed foods, and foods high in saturated/trans fats  Aim for low stress - take time to unwind and care for your mental health  Aim for 150 min of moderate intensity exercise weekly for heart health, and weights twice weekly for bone health  Aim for 7-9 hours of sleep daily

## 2021-03-10 DIAGNOSIS — Z85828 Personal history of other malignant neoplasm of skin: Secondary | ICD-10-CM | POA: Diagnosis not present

## 2021-03-10 DIAGNOSIS — L570 Actinic keratosis: Secondary | ICD-10-CM | POA: Diagnosis not present

## 2021-03-10 DIAGNOSIS — L821 Other seborrheic keratosis: Secondary | ICD-10-CM | POA: Diagnosis not present

## 2021-03-10 DIAGNOSIS — L905 Scar conditions and fibrosis of skin: Secondary | ICD-10-CM | POA: Diagnosis not present

## 2021-03-17 DIAGNOSIS — G301 Alzheimer's disease with late onset: Secondary | ICD-10-CM | POA: Diagnosis not present

## 2021-03-17 DIAGNOSIS — F0390 Unspecified dementia without behavioral disturbance: Secondary | ICD-10-CM | POA: Diagnosis not present

## 2021-03-17 DIAGNOSIS — Z79899 Other long term (current) drug therapy: Secondary | ICD-10-CM | POA: Diagnosis not present

## 2021-03-17 DIAGNOSIS — F0280 Dementia in other diseases classified elsewhere without behavioral disturbance: Secondary | ICD-10-CM | POA: Diagnosis not present

## 2021-03-23 DIAGNOSIS — F4320 Adjustment disorder, unspecified: Secondary | ICD-10-CM | POA: Diagnosis not present

## 2021-03-25 DIAGNOSIS — H903 Sensorineural hearing loss, bilateral: Secondary | ICD-10-CM | POA: Diagnosis not present

## 2021-04-21 DIAGNOSIS — L819 Disorder of pigmentation, unspecified: Secondary | ICD-10-CM | POA: Diagnosis not present

## 2021-04-21 DIAGNOSIS — L298 Other pruritus: Secondary | ICD-10-CM | POA: Diagnosis not present

## 2021-04-21 DIAGNOSIS — L570 Actinic keratosis: Secondary | ICD-10-CM | POA: Diagnosis not present

## 2021-04-21 DIAGNOSIS — L905 Scar conditions and fibrosis of skin: Secondary | ICD-10-CM | POA: Diagnosis not present

## 2021-04-21 DIAGNOSIS — D692 Other nonthrombocytopenic purpura: Secondary | ICD-10-CM | POA: Diagnosis not present

## 2021-04-21 DIAGNOSIS — Z85828 Personal history of other malignant neoplasm of skin: Secondary | ICD-10-CM | POA: Diagnosis not present

## 2021-05-04 ENCOUNTER — Encounter: Payer: Self-pay | Admitting: Internal Medicine

## 2021-05-04 ENCOUNTER — Ambulatory Visit (INDEPENDENT_AMBULATORY_CARE_PROVIDER_SITE_OTHER): Payer: Medicare Other | Admitting: Internal Medicine

## 2021-05-04 ENCOUNTER — Other Ambulatory Visit: Payer: Self-pay

## 2021-05-04 VITALS — BP 126/84 | HR 87 | Temp 97.2°F | Resp 16 | Ht 69.0 in | Wt 150.6 lb

## 2021-05-04 DIAGNOSIS — R600 Localized edema: Secondary | ICD-10-CM

## 2021-05-04 DIAGNOSIS — I82421 Acute embolism and thrombosis of right iliac vein: Secondary | ICD-10-CM | POA: Diagnosis not present

## 2021-05-04 DIAGNOSIS — I10 Essential (primary) hypertension: Secondary | ICD-10-CM

## 2021-05-04 DIAGNOSIS — D692 Other nonthrombocytopenic purpura: Secondary | ICD-10-CM

## 2021-05-04 DIAGNOSIS — Z79899 Other long term (current) drug therapy: Secondary | ICD-10-CM

## 2021-05-04 NOTE — Progress Notes (Addendum)
Future Appointments  Date Time Provider [place]  06/01/2021  9:30 AM Unk Pinto, MD [facility] None  12/08/2021 11:00 AM Unk Pinto, MD [facility] None  02/25/2022  9:30 AM Garnet Sierras, NP [facility] None    History of Present Illness:      Patient is a very nice 85 yo [name] with hx/o HTN, HLD who presents with a 1 week prodrome of scattered small ecchymoses of the distal upper & & lower extremities over the dorsal  Forearms & shins. He also expressed concern of "swelling " of the RLE over the last 1 & 1/2 days. Denies fevers, chills, respiratory or GI sx's otherwise. Denies any tick bites.                                 Medications   Current Outpatient Medications (Cardiovascular):  .  atorvastatin (LIPITOR) 40 MG tablet, Take     1 tablet     Daily        for Cholesterol  Current Outpatient Medications (Respiratory):  .  pseudoephedrine (SUDAFED) 30 MG tablet, Take 30 mg by mouth every 4 (four) hours as needed for congestion.  Current Outpatient Medications (Analgesics):  .  aspirin 81 MG tablet, Take 81 mg by mouth daily.   Current Outpatient Medications (Other):  [name]  Ascorbic Acid (VITAMIN C PO), Take by mouth daily. .  Cholecalciferol (VITAMIN D PO), Take by mouth daily. [name]  CINNAMON PO, Take 1,000 mg by mouth daily. .  Coenzyme Q10 200 MG TABS, Take 200 mg by mouth daily. .  fish oil-omega-3 fatty acids 1000 MG capsule, Take 2 g by mouth daily. .  Flaxseed, Linseed, (FLAXSEED OIL PO), Take 1,000 mg by mouth daily. .  hydrocortisone (PROCTO-MED HC) 2.5 % rectal cream, User 3 to 4 x / day as needed .  hydrocortisone-pramoxine (ANALPRAM-HC) 2.5-1 % rectal cream, Apply rectally 1-3 times daily as needed for hemorrhoid flare .  Magnesium 250 MG TABS, Take 250 mg by mouth daily. .  memantine (NAMENDA) 10 MG tablet, Take 1 tablet (10 mg total) by mouth 2 (two) times daily. For memory. .  Multiple Vitamins-Minerals (MULTIVITAMIN PO), Take by mouth daily. .   Multiple Vitamins-Minerals (ZINC PO), Take by mouth. .  triamcinolone ointment (KENALOG) 0.1 %, Apply 1 application topically 2 (two) times daily. .  vitamin C (ASCORBIC ACID) 500 MG tablet, Take 500 mg by mouth daily.  Problem list He has Hyperlipidemia, mixed; Essential hypertension; GERD (gastroesophageal reflux disease); Vitamin D deficiency; Medication management; History of prostate cancer; Encounter for Medicare annual wellness exam; BMI 22.0-22.9, adult; History of basal cell carcinoma; Former smoker; FH: heart disease; COPD (chronic obstructive pulmonary disease) ([place]); Abnormal glucose; Memory changes; Mild cognitive impairment; and Vascular dementia without behavioral disturbance ([facility]) on their problem list.   Observations/Objective:  BP 126/84   Pulse 87   Temp (!) 97.2 F (36.2 C)   Resp 16   Ht '5\' 9"'  (1.753 m)   Wt 150 lb 9.6 oz (68.3 kg)   SpO2 99%   BMI 22.24 kg/m   HEENT - WNL. Neck - supple.  Chest - Clear equal BS. Cor - Nl HS. RRR w/o sig MGR. PP 2(+).            Slight assymetric edema - Rt>> LLE.            Negative Homan's & Negstive calf  compression, MS-  FROM w/o deformities.  Gait Nl. Neuro -  Nl w/o focal abnormalities.   Assessment and Plan:  1. Edema of right lower leg  - CBC with Differential/Platelet - COMPLETE METABOLIC PANEL WITH GFR - Sedimentation rate - C-reactive protein - D-dimer, quantitative - VAS [place] LOWER EXTREMITY VENOUS (DVT); Future - Sx Xarelto 15 mg  #4 tabs - start 1 tab  2 x/day pending Doppler studies .    2. Purpura ([place])  - D-dimer, quantitative - VAS [place] LOWER EXTREMITY VENOUS (DVT); Future - ESR & hsCPR to r/o vasculitis.   3. Essential hypertension  4. Medication management  Follow Up Instructions:        I discussed the assessment and treatment plan with the patient. The patient was provided an opportunity to ask questions and all were answered. The patient agreed with the plan and demonstrated an  understanding of the instructions.       The patient was advised to call back or seek an in-person evaluation if the symptoms worsen or if the condition fails to improve as anticipated.    Kirtland Bouchard, MD    Addendum;   Venous dopplers showed DVT into Rt iliac vein &patient 7 wifr given Starter pack for Xarelto .  [place]

## 2021-05-05 ENCOUNTER — Ambulatory Visit (HOSPITAL_COMMUNITY)
Admission: RE | Admit: 2021-05-05 | Discharge: 2021-05-05 | Disposition: A | Discharge status: Home / Self Care | Payer: Medicare Other | Source: Ambulatory Visit | Attending: Internal Medicine | Admitting: Internal Medicine

## 2021-05-05 ENCOUNTER — Other Ambulatory Visit: Payer: Self-pay | Admitting: Internal Medicine

## 2021-05-05 DIAGNOSIS — D692 Other nonthrombocytopenic purpura: Secondary | ICD-10-CM | POA: Diagnosis not present

## 2021-05-05 DIAGNOSIS — R600 Localized edema: Secondary | ICD-10-CM | POA: Insufficient documentation

## 2021-05-05 LAB — CBC WITH DIFFERENTIAL/PLATELET
Absolute Monocytes: 963 cells/uL — ABNORMAL HIGH (ref 200–950)
Basophils Absolute: 93 cells/uL (ref 0–200)
Basophils Relative: 0.8 %
Eosinophils Absolute: 290 cells/uL (ref 15–500)
Eosinophils Relative: 2.5 %
HCT: 48.4 % (ref 38.5–50.0)
Hemoglobin: 16.3 g/dL (ref 13.2–17.1)
Lymphs Abs: 1276 cells/uL (ref 850–3900)
MCH: 30.6 pg (ref 27.0–33.0)
MCHC: 33.7 g/dL (ref 32.0–36.0)
MCV: 91 fL (ref 80.0–100.0)
MPV: 10.4 fL (ref 7.5–12.5)
Monocytes Relative: 8.3 %
Neutro Abs: 8978 cells/uL — ABNORMAL HIGH (ref 1500–7800)
Neutrophils Relative %: 77.4 %
Platelets: 264 10*3/uL (ref 140–400)
RBC: 5.32 10*6/uL (ref 4.20–5.80)
RDW: 12.6 % (ref 11.0–15.0)
Total Lymphocyte: 11 %
WBC: 11.6 10*3/uL — ABNORMAL HIGH (ref 3.8–10.8)

## 2021-05-05 LAB — COMPLETE METABOLIC PANEL WITH GFR
AG Ratio: 1.7 (calc) (ref 1.0–2.5)
ALT: 30 U/L (ref 9–46)
AST: 29 U/L (ref 10–35)
Albumin: 4.4 g/dL (ref 3.6–5.1)
Alkaline phosphatase (APISO): 89 U/L (ref 35–144)
BUN: 17 mg/dL (ref 7–25)
CO2: 30 mmol/L (ref 20–32)
Calcium: 9.5 mg/dL (ref 8.6–10.3)
Chloride: 105 mmol/L (ref 98–110)
Creat: 1.1 mg/dL (ref 0.70–1.11)
GFR, Est African American: 72 mL/min/{1.73_m2} (ref >=60)
GFR, Est Non African American: 62 mL/min/{1.73_m2} (ref >=60)
Globulin: 2.6 g/dL (calc) (ref 1.9–3.7)
Glucose, Bld: 93 mg/dL (ref 65–99)
Potassium: 4.5 mmol/L (ref 3.5–5.3)
Sodium: 143 mmol/L (ref 135–146)
Total Bilirubin: 0.7 mg/dL (ref 0.2–1.2)
Total Protein: 7 g/dL (ref 6.1–8.1)

## 2021-05-05 LAB — C-REACTIVE PROTEIN: CRP: 13.3 mg/L — ABNORMAL HIGH (ref <8.0)

## 2021-05-05 LAB — SEDIMENTATION RATE: Sed Rate: 9 mm/h (ref 0–20)

## 2021-05-05 LAB — D-DIMER, QUANTITATIVE: D-Dimer, Quant: 7.5 mcg/mL FEU — ABNORMAL HIGH (ref <0.50)

## 2021-05-05 MED ORDER — RIVAROXABAN 20 MG PO TABS: 20.0000 mg | ORAL_TABLET | Freq: Every day | ORAL | 1 refills | Status: DC

## 2021-05-05 MED ORDER — RIVAROXABAN (XARELTO) VTE STARTER PACK (15 & 20 MG): ORAL_TABLET | ORAL | 0 refills | Status: DC

## 2021-05-05 NOTE — Patient Instructions (Signed)
Deep Vein Thrombosis  Deep vein thrombosis (DVT) is a condition in which a blood clot forms in a deep vein, such as a vein in the lower leg, thigh, pelvis, or arm. Deep veins are veins in the deep venous system. A clot is blood that has thickened into a gel or solid. This condition is serious and can be life-threatening if the clot travels to the lungs and causes a blockage (pulmonary embolism) in the arteries of the lung. A DVT can also damage veins in the leg. This can lead to long-term, or chronic, venous disease, leg pain, swelling, discoloration, and ulcers or sores (post-thrombotic syndrome). What are the causes? This condition may be caused by:  A slowdown of blood flow.  Damage to a vein.  A condition that causes blood to clot more easily, such as certain blood-clotting disorders. What increases the risk? The following factors may make you more likely to develop this condition:  Having obesity.  Being older, especially older than age 60.  Being inactive (sedentary lifestyle) or not moving around. This may include: ? Sitting or lying down for longer than 4-6 hours other than to sleep at night. ? Being in the hospital, having major or lengthy surgery, or having a thin, flexible tube (central line catheter) placed in a large vein.  Being pregnant, giving birth, or having recently given birth.  Taking medicines that contain estrogen, such as birth control or hormone replacement therapy.  Using products that contain nicotine or tobacco, especially if you use hormonal birth control.  Having a history of blood clots or a blood-clotting disease, a blood vessel disease (peripheral vascular disease), or congestive heart disease.  Having a history of cancer, especially if being treated with chemotherapy. What are the signs or symptoms? Symptoms of this condition include:  Swelling, pain, pressure, or tenderness in an arm or a leg.  An arm or a leg becoming warm, red, or  discolored.  A leg turning very pale. You may have a large DVT. This is rare. If the clot is in your leg, you may notice symptoms more or have worse symptoms when you stand or walk. In some cases, there are no symptoms. How is this diagnosed? This condition is diagnosed with:  Your medical history and a physical exam.  Tests, such as: ? Blood tests to check how well your blood clots. ? Doppler ultrasound. This is the best way to find a DVT. ? Venogram. Contrast dye is injected into a vein, and X-rays are taken to check for clots. How is this treated? Treatment for this condition depends on:  The cause of your DVT.  The size and location of your DVT, or having more than one DVT.  Your risk for bleeding or developing more clots.  Other medical conditions you may have. Treatment may include:  Taking a blood thinner, also called an anticoagulant, to prevent clots from forming and growing.  Wearing compression stockings, if directed.  Injecting medicines into the affected vein to break up the clot (catheter-directed thrombolysis). This is used only for severe DVT and only if a specialist recommends it.  Specific surgical procedures, when DVT is severe or hard to treat. These may be done to: ? Isolate and remove your clot. ? Place an inferior vena cava (IVC) filter in a large vein to catch blood clots before they reach your lungs. You may get some medical treatments for 6 months or longer. Follow these instructions at home: If you are taking blood thinners:    Talk with your health care provider before you take any medicines that contain aspirin or NSAIDs, such as ibuprofen. These medicines increase your risk for dangerous bleeding.  Take your medicine exactly as told, at the same time every day. Do not skip a dose. Do not take more than the prescribed dose. This is important.  Ask your health care provider about foods and medicines that could change the way your blood thinner  works (may interact). Avoid these foods and medicines if you are told to do so.  Avoid anything that may cause bleeding or bruising. You may bleed more easily while taking blood thinners. ? Be very careful when using knives, scissors, or other sharp objects. ? Use an electric razor instead of a blade. ? Avoid activities that could cause injury or bruising, and follow instructions for preventing falls. ? Tell your health care provider if you have had any internal bleeding, bleeding ulcers, or neurologic diseases, such as strokes or cerebral aneurysms.  Wear a medical alert bracelet or carry a card that lists what medicines you take. General instructions  Take over-the-counter and prescription medicines only as told by your health care provider.  Return to your normal activities as told by your health care provider. Ask your health care provider what activities are safe for you.  If recommended, wear compression stockings as told by your health care provider. These stockings help to prevent blood clots and reduce swelling in your legs.  Keep all follow-up visits as told by your health care provider. This is important. Contact a health care provider if:  You miss a dose of your blood thinner.  You have new or worse pain, swelling, or redness in an arm or a leg.  You have worsening numbness or tingling in an arm or a leg.  You have unusual bruising. Get help right away if:  You have signs or symptoms that a blood clot has moved to the lungs. These may include: ? Shortness of breath. ? Chest pain. ? Fast or irregular heartbeats (palpitations). ? Light-headedness or dizziness. ? Coughing up blood.  You have signs or symptoms that your blood is too thin. These may include: ? Blood in your vomit, stool, or urine. ? A cut that will not stop bleeding. ? A menstrual period that is heavier than usual. ? A severe headache or confusion. These symptoms may represent a serious problem that  is an emergency. Do not wait to see if the symptoms will go away. Get medical help right away. Call your local emergency services (911 in the [place] Do not drive yourself to the hospital. Summary  Deep vein thrombosis (DVT) happens when a blood clot forms in a deep vein. This may occur in the lower leg, thigh, pelvis, or arm.  Symptoms affect the arm or leg and can include swelling, pain, tenderness, warmth, redness, or discoloration.  This condition may be treated with medicines or compression stockings. In severe cases, surgery may be done.  If you are taking blood thinners, take them exactly as told. Do not skip a dose. Do not take more than is prescribed.  Get help right away if you have shortness of breath, chest pain, fast or irregular heartbeats, or blood in your vomit, urine, or stool. This information is not intended to replace advice given to you by your health care provider. Make sure you discuss any questions you have with your health care provider. Document Revised: 12/08/2019 Document Reviewed: 12/08/2019 Elsevier Patient Education  2021 [employer]. +++++++++++++++++++++++++++  Rivaroxaban oral tablets  What is this medicine? RIVAROXABAN (ri va ROX a ban) is an anticoagulant (blood thinner). It is used to treat blood clots in the lungs or in the veins. It is also used to prevent blood clots in the lungs or in the veins. It is also used to lower the chance of stroke in people with a medical condition called atrial fibrillation. This medicine may be used for other purposes; ask your health care provider or pharmacist if you have questions. COMMON BRAND NAME(S): Xarelto, Xarelto Starter Pack What should I tell my health care provider before I take this medicine? They need to know if you have any of these conditions:  antiphospholipid antibody syndrome  artificial heart valve  bleeding disorders  bleeding in the brain  blood in your stools (black or tarry stools) or if  you have blood in your vomit  history of blood clots  history of stomach bleeding  kidney disease  liver disease  low blood counts, like low white cell, platelet, or red cell counts  recent or planned spinal or epidural procedure  take medicines that treat or prevent blood clots  an unusual or allergic reaction to rivaroxaban, other medicines, foods, dyes, or preservatives  pregnant or trying to get pregnant  breast-feeding How should I use this medicine? Take this medicine by mouth with a glass of water. Follow the directions on the prescription label. Take your medicine at regular intervals. Do not take it more often than directed. Do not stop taking except on your doctor's advice. Stopping this medicine may increase your risk of a blood clot. Be sure to refill your prescription before you run out of medicine. If you are taking this medicine after hip or knee replacement surgery, take it with or without food. If you are taking this medicine for atrial fibrillation, take it with your evening meal. If you are taking this medicine to treat blood clots, take it with food at the same time each day. If you are unable to swallow your tablet, you may crush the tablet and mix it in applesauce. Then, immediately eat the applesauce. You should eat more food right after you eat the applesauce containing the crushed tablet. Talk to your pediatrician regarding the use of this medicine in children. Special care may be needed. Overdosage: If you think you have taken too much of this medicine contact a poison control center or emergency room at once. NOTE: This medicine is only for you. Do not share this medicine with others. What if I miss a dose? If you take your medicine once a day and miss a dose, take the missed dose as soon as you remember. If it is almost time for your next dose, take only that dose. Do not take double or extra doses. If you take your medicine twice a day and miss a dose, take  the missed dose immediately. In this instance, 2 tablets may be taken at the same time. The next day you should take 1 tablet twice a day as directed. What may interact with this medicine? Do not take this medicine with any of the following medications:  defibrotide This medicine may also interact with the following medications:  aspirin and aspirin-like medicines  certain antibiotics like erythromycin, azithromycin, and clarithromycin  certain medicines for fungal infections like ketoconazole and itraconazole  certain medicines for irregular heart beat like amiodarone, quinidine, dronedarone  certain medicines for seizures like carbamazepine, phenytoin  certain medicines that  treat or prevent blood clots like warfarin, enoxaparin, and dalteparin  conivaptan  felodipine  indinavir  lopinavir; ritonavir  NSAIDS, medicines for pain and inflammation, like ibuprofen or naproxen  ranolazine  rifampin  ritonavir  SNRIs, medicines for depression, like desvenlafaxine, duloxetine, levomilnacipran, venlafaxine  SSRIs, medicines for depression, like citalopram, escitalopram, fluoxetine, fluvoxamine, paroxetine, sertraline  St. John's wort  verapamil This list may not describe all possible interactions. Give your health care provider a list of all the medicines, herbs, non-prescription drugs, or dietary supplements you use. Also tell them if you smoke, drink alcohol, or use illegal drugs. Some items may interact with your medicine. What should I watch for while using this medicine? Visit your healthcare professional for regular checks on your progress. You may need blood work done while you are taking this medicine. Your condition will be monitored carefully while you are receiving this medicine. It is important not to miss any appointments. Avoid sports and activities that might cause injury while you are using this medicine. Severe falls or injuries can cause unseen bleeding. Be  careful when using sharp tools or knives. Consider using an Copy. Take special care brushing or flossing your teeth. Report any injuries, bruising, or red spots on the skin to your healthcare professional. If you are going to need surgery or other procedure, tell your healthcare professional that you are taking this medicine. Wear a medical ID bracelet or chain. Carry a card that describes your disease and details of your medicine and dosage times. What side effects may I notice from receiving this medicine? Side effects that you should report to your doctor or health care professional as soon as possible:  allergic reactions like skin rash, itching or hives, swelling of the face, lips, or tongue  back pain  redness, blistering, peeling or loosening of the skin, including inside the mouth  signs and symptoms of bleeding such as bloody or black, tarry stools; red or dark-brown urine; spitting up blood or brown material that looks like coffee grounds; red spots on the skin; unusual bruising or bleeding from the eye, gums, or nose  signs and symptoms of a blood clot such as chest pain; shortness of breath; pain, swelling, or warmth in the leg  signs and symptoms of a stroke such as changes in vision; confusion; trouble speaking or understanding; severe headaches; sudden numbness or weakness of the face, arm or leg; trouble walking; dizziness; loss of coordination Side effects that usually do not require medical attention (report to your doctor or health care professional if they continue or are bothersome):  dizziness  muscle pain This list may not describe all possible side effects. Call your doctor for medical advice about side effects. You may report side effects to FDA at 1-800-FDA-1088. Where should I keep my medicine? Keep out of the reach of children. Store at room temperature between 15 and 30 degrees C (59 and 86 degrees F). Throw away any unused medicine after the expiration  date. NOTE: This sheet is a summary. It may not cover all possible information. If you have questions about this medicine, talk to your doctor, pharmacist, or health care provider.  2021 Elsevier/[employer] Standard (2019-03-12 09:45:59)

## 2021-05-05 NOTE — Progress Notes (Signed)
============================================================ -   Test results slightly outside the reference range are not unusual. If there is anything important, I will review this with you,  otherwise it is considered normal test values.  If you have further questions,  please do not hesitate to contact me at the office or via My Chart.  ============================================================ ============================================================  -  Most significant test is (+) Positive D-Dimer is elevated suspect for                                           "clotting consistent with suspected DVT in Rt Leg  - All else "OK" ============================================================ ============================================================

## 2021-05-05 NOTE — Addendum Note (Signed)
Addended by: Unk Pinto on: 05/05/2021 04:13 PM   Modules accepted: Orders

## 2021-05-05 NOTE — Progress Notes (Signed)
============================================================ -   Test results slightly outside the reference range are not unusual. If there is anything important, I will review this with you,  otherwise it is considered normal test values.  If you have further questions,  please do not hesitate to contact me at the office or via My Chart.  ============================================================ ============================================================  -   (+) Positive Venous Doppler confirms "Blood Clot in Iliac Vein" or                                                                     "DVT" as discussed with wife - Vaughan Basta  Initiated treatment w/ Xarelto 15 mg (#4 tabs) to take 2 x/day - on day previous   Given wife coupon for starter pak for DVT - Xarelto 15 mg 2 x /day for                                                3 week, then change to 20 mg Daily for 3-6 month   - Discussed meds with wife & recommended a f/u OV in 3-4 weeks ============================================================ ============================================================

## 2021-05-07 ENCOUNTER — Telehealth: Payer: Self-pay | Admitting: *Deleted

## 2021-05-07 NOTE — Telephone Encounter (Signed)
Returned call to the patient's spouse regarding leg swelling. Patient's right is more swollen today than previous. Per Dr Melford Aase, elevate the leg when sitting, continue taking Xarelto and purchase compression hose that are thigh high and 20/30 compression. Spouse is aware.

## 2021-05-22 DIAGNOSIS — Z23 Encounter for immunization: Secondary | ICD-10-CM | POA: Diagnosis not present

## 2021-05-31 NOTE — Patient Instructions (Signed)
Rivaroxaban oral tablets What is this medicine? RIVAROXABAN (ri va ROX a ban) is an anticoagulant (blood thinner). It is used to treat blood clots in the lungs or in the veins. It is also used to prevent blood clots in the lungs or in the veins. It is also used to lower the chance of stroke in people with a medical condition called atrial fibrillation. This medicine may be used for other purposes; ask your health care provider or pharmacist if you have questions. COMMON BRAND NAME(S): Xarelto, Xarelto Starter Pack What should I tell my health care provider before I take this medicine? They need to know if you have any of these conditions:  antiphospholipid antibody syndrome  artificial heart valve  bleeding disorders  bleeding in the brain  blood in your stools (black or tarry stools) or if you have blood in your vomit  history of blood clots  history of stomach bleeding  kidney disease  liver disease  low blood counts, like low white cell, platelet, or red cell counts  recent or planned spinal or epidural procedure  take medicines that treat or prevent blood clots  an unusual or allergic reaction to rivaroxaban, other medicines, foods, dyes, or preservatives  pregnant or trying to get pregnant  breast-feeding How should I use this medicine? Take this medicine by mouth with a glass of water. Follow the directions on the prescription label. Take your medicine at regular intervals. Do not take it more often than directed. Do not stop taking except on your doctor's advice. Stopping this medicine may increase your risk of a blood clot. Be sure to refill your prescription before you run out of medicine. If you are taking this medicine after hip or knee replacement surgery, take it with or without food. If you are taking this medicine for atrial fibrillation, take it with your evening meal. If you are taking this medicine to treat blood clots, take it with food at the same time each  day. If you are unable to swallow your tablet, you may crush the tablet and mix it in applesauce. Then, immediately eat the applesauce. You should eat more food right after you eat the applesauce containing the crushed tablet. Talk to your pediatrician regarding the use of this medicine in children. Special care may be needed. Overdosage: If you think you have taken too much of this medicine contact a poison control center or emergency room at once. NOTE: This medicine is only for you. Do not share this medicine with others. What if I miss a dose? If you take your medicine once a day and miss a dose, take the missed dose as soon as you remember. If it is almost time for your next dose, take only that dose. Do not take double or extra doses. If you take your medicine twice a day and miss a dose, take the missed dose immediately. In this instance, 2 tablets may be taken at the same time. The next day you should take 1 tablet twice a day as directed. What may interact with this medicine? Do not take this medicine with any of the following medications:  defibrotide This medicine may also interact with the following medications:  aspirin and aspirin-like medicines  certain antibiotics like erythromycin, azithromycin, and clarithromycin  certain medicines for fungal infections like ketoconazole and itraconazole  certain medicines for irregular heart beat like amiodarone, quinidine, dronedarone  certain medicines for seizures like carbamazepine, phenytoin  certain medicines that treat or prevent blood clots  like warfarin, enoxaparin, and dalteparin  conivaptan  felodipine  indinavir  lopinavir; ritonavir  NSAIDS, medicines for pain and inflammation, like ibuprofen or naproxen  ranolazine  rifampin  ritonavir  SNRIs, medicines for depression, like desvenlafaxine, duloxetine, levomilnacipran, venlafaxine  SSRIs, medicines for depression, like citalopram, escitalopram, fluoxetine,  fluvoxamine, paroxetine, sertraline  St. John's wort  verapamil This list may not describe all possible interactions. Give your health care provider a list of all the medicines, herbs, non-prescription drugs, or dietary supplements you use. Also tell them if you smoke, drink alcohol, or use illegal drugs. Some items may interact with your medicine. What should I watch for while using this medicine? Visit your healthcare professional for regular checks on your progress. You may need blood work done while you are taking this medicine. Your condition will be monitored carefully while you are receiving this medicine. It is important not to miss any appointments. Avoid sports and activities that might cause injury while you are using this medicine. Severe falls or injuries can cause unseen bleeding. Be careful when using sharp tools or knives. Consider using an Copy. Take special care brushing or flossing your teeth. Report any injuries, bruising, or red spots on the skin to your healthcare professional. If you are going to need surgery or other procedure, tell your healthcare professional that you are taking this medicine. Wear a medical ID bracelet or chain. Carry a card that describes your disease and details of your medicine and dosage times. What side effects may I notice from receiving this medicine? Side effects that you should report to your doctor or health care professional as soon as possible:  allergic reactions like skin rash, itching or hives, swelling of the face, lips, or tongue  back pain  redness, blistering, peeling or loosening of the skin, including inside the mouth  signs and symptoms of bleeding such as bloody or black, tarry stools; red or dark-brown urine; spitting up blood or brown material that looks like coffee grounds; red spots on the skin; unusual bruising or bleeding from the eye, gums, or nose  signs and symptoms of a blood clot such as chest pain;  shortness of breath; pain, swelling, or warmth in the leg  signs and symptoms of a stroke such as changes in vision; confusion; trouble speaking or understanding; severe headaches; sudden numbness or weakness of the face, arm or leg; trouble walking; dizziness; loss of coordination Side effects that usually do not require medical attention (report to your doctor or health care professional if they continue or are bothersome):  dizziness  muscle pain ==========================  Due to recent changes in healthcare laws, you may see the results of your imaging and laboratory studies on MyChart before your provider has had a chance to review them.  We understand that in some cases there may be results that are confusing or concerning to you. Not all laboratory results come back in the same time frame and the provider may be waiting for multiple results in order to interpret others.  Please give [place] 48 hours in order for your provider to thoroughly review all the results before contacting the office for clarification of your results.     ++++++++++++++++++++++++++++++++++  Vit D  & Vit C 1,000 mg   are recommended to help protect  against the Covid-19 and other Corona viruses.    Also it's recommended  to take  Zinc 50 mg  to help  protect against the Covid-19   and best place  to get  is also on [email or web address]  and don't pay more than 6-8 cents /pill !   ===================================== Coronavirus (COVID-19) Are you at risk?  Are you at risk for the Coronavirus (COVID-19)?  To be considered HIGH RISK for Coronavirus (COVID-19), you have to meet the following criteria:  . Traveled to [place], [place], [place], [place] or [place]; or in the [place] to [place], [place], [place]  . or [place]; and have fever, cough, and shortness of breath within the last 2 weeks of travel OR . Been in close contact with a person diagnosed with COVID-19 within the last 2 weeks and have   . fever, cough,and shortness of breath .  . IF YOU DO NOT MEET THESE CRITERIA, YOU ARE CONSIDERED LOW RISK FOR COVID-19.  What to do if you are HIGH RISK for COVID-19?  [name] If you are having a medical emergency, call 911. . Seek medical care right away. Before you go to a doctor's office, urgent care or emergency department, .  call ahead and tell them about your recent travel, contact with someone diagnosed with COVID-19  .  and your symptoms.  . You should receive instructions from your physician's office regarding next steps of care.  . When you arrive at healthcare provider, tell the healthcare staff immediately you have returned from  . visiting [place], [place], [place], [place] or [place]; or traveled in the [place] to [place], [place],  . [place] or [place] in the last two weeks or you have been in close contact with a person diagnosed with  . COVID-19 in the last 2 weeks.   . Tell the health care staff about your symptoms: fever, cough and shortness of breath. . After you have been seen by a medical provider, you will be either: o Tested for (COVID-19) and discharged home on quarantine except to seek medical care if  o symptoms worsen, and asked to  - Stay home and avoid contact with others until you get your results (4-5 days)  - Avoid travel on public transportation if possible (such as bus, train, or airplane) or o Sent to the Emergency Department by EMS for evaluation, COVID-19 testing  and  o possible admission depending on your condition and test results.  What to do if you are LOW RISK for COVID-19?  Reduce your risk of any infection by using the same precautions used for avoiding the common cold or flu:  [name] Wash your hands often with soap and warm water for at least 20 seconds.  If soap and water are not readily available,  . use an alcohol-based hand sanitizer with at least 60% alcohol.  . If coughing or sneezing, cover your mouth and nose by coughing or  sneezing into the elbow areas of your shirt or coat, .  into a tissue or into your sleeve (not your hands). . Avoid shaking hands with others and consider head nods or verbal greetings only. . Avoid touching your eyes, nose, or mouth with unwashed hands.  . Avoid close contact with people who are sick. . Avoid places or events with large numbers of people in one location, like concerts or sporting events. . Carefully consider travel plans you have or are making. . If you are planning any travel outside or inside the [place], visit the CDC's Travelers' Health webpage for the latest health notices. . If you have some symptoms but not all symptoms, continue to monitor at home  and seek medical attention  . if your symptoms worsen. . If you are having a medical emergency, call 911.   ++++++++++++++++++++++++++++++++ Recommend Adult Low Dose Aspirin or  coated  Aspirin 81 mg daily  To reduce risk of Colon Cancer 40 %,  Skin Cancer 26 % ,  Melanoma 46%  and  Pancreatic cancer 60% ++++++++++++++++++++++++++++++++ Vitamin D goal  is between 70-100.  Please make sure that you are taking your Vitamin D as directed.  It is very important as a natural anti-inflammatory  helping hair, skin, and nails, as well as reducing stroke and heart attack risk.  It helps your bones and helps with mood. It also decreases numerous cancer risks so please take it as directed.  Low Vit D is associated with a 200-300% higher risk for CANCER  and 200-300% higher risk for HEART   ATTACK  &  STROKE.   .....................................[name] It is also associated with higher death rate at younger ages,  autoimmune diseases like Rheumatoid arthritis, Lupus, Multiple Sclerosis.    Also many other serious conditions, like depression, Alzheimer's Dementia, infertility, muscle aches, fatigue, fibromyalgia - just to name a few. ++++++++++++++++++++ Recommend the book "The END of DIETING" by Dr Excell Seltzer  & the book "The  END of DIABETES " by Dr Excell Seltzer At [email or web address] - get book & Audio CD's    Being diabetic has a  300% increased risk for heart attack, stroke, cancer, and alzheimer- type vascular dementia. It is very important that you work harder with diet by avoiding all foods that are white. Avoid white rice (brown & wild rice is OK), white potatoes (sweetpotatoes in moderation is OK), White bread or wheat bread or anything made out of white flour like bagels, donuts, rolls, buns, biscuits, cakes, pastries, cookies, pizza crust, and pasta (made from white flour & egg whites) - vegetarian pasta or spinach or wheat pasta is OK. Multigrain breads like Arnold's or Pepperidge Farm, or multigrain sandwich thins or flatbreads.  Diet, exercise and weight loss can reverse and cure diabetes in the early stages.  Diet, exercise and weight loss is very important in the control and prevention of complications of diabetes which affects every system in your body, ie. Brain - dementia/stroke, eyes - glaucoma/blindness, heart - heart attack/heart failure, kidneys - dialysis, stomach - gastric paralysis, intestines - malabsorption, nerves - severe painful neuritis, circulation - gangrene & loss of a leg(s), and finally cancer and Alzheimers.    I recommend avoid fried & greasy foods,  sweets/candy, white rice (brown or wild rice or Quinoa is OK), white potatoes (sweet potatoes are OK) - anything made from white flour - bagels, doughnuts, rolls, buns, biscuits,white and wheat breads, pizza crust and traditional pasta made of white flour & egg white(vegetarian pasta or spinach or wheat pasta is OK).  Multi-grain bread is OK - like multi-grain flat bread or sandwich thins. Avoid alcohol in excess. Exercise is also important.    Eat all the vegetables you want - avoid meat, especially red meat and dairy - especially cheese.  Cheese is the most concentrated form of trans-fats which is the worst thing to clog up our arteries. Veggie cheese is  OK which can be found in the fresh produce section at [employer] or [employer] or [employer]  +++++++++++++++++++++ DASH Eating Plan  DASH stands for "Dietary Approaches to Stop Hypertension."   The DASH eating plan is a healthy eating plan that has been shown to reduce high blood pressure (  hypertension). Additional health benefits may include reducing the risk of type 2 diabetes mellitus, heart disease, and stroke. The DASH eating plan may also help with weight loss. WHAT DO I NEED TO KNOW ABOUT THE DASH EATING PLAN? For the DASH eating plan, you will follow these general guidelines:  Choose foods with a percent daily value for sodium of less than 5% (as listed on the food label).  Use salt-free seasonings or herbs instead of table salt or sea salt.  Check with your health care provider or pharmacist before using salt substitutes.  Eat lower-sodium products, often labeled as "lower sodium" or "no salt added."  Eat fresh foods.  Eat more vegetables, fruits, and low-fat dairy products.  Choose whole grains. Look for the word "whole" as the first word in the ingredient list.  Choose fish   Limit sweets, desserts, sugars, and sugary drinks.  Choose heart-healthy fats.  Eat veggie cheese   Eat more home-cooked food and less restaurant, buffet, and fast food.  Limit fried foods.  Cook foods using methods other than frying.  Limit canned vegetables. If you do use them, rinse them well to decrease the sodium.  When eating at a restaurant, ask that your food be prepared with less salt, or no salt if possible.                      WHAT FOODS CAN I EAT? Read Dr Fara Olden Fuhrman's books on The End of Dieting & The End of Diabetes  Grains Whole grain or whole wheat bread. Brown rice. Whole grain or whole wheat pasta. Quinoa, bulgur, and whole grain cereals. Low-sodium cereals. Corn or whole wheat flour tortillas. Whole grain cornbread. Whole grain crackers. Low-sodium  crackers.  Vegetables Fresh or frozen vegetables (raw, steamed, roasted, or grilled). Low-sodium or reduced-sodium tomato and vegetable juices. Low-sodium or reduced-sodium tomato sauce and paste. Low-sodium or reduced-sodium canned vegetables.   Fruits All fresh, canned (in natural juice), or frozen fruits.  Protein Products  All fish and seafood.  Dried beans, peas, or lentils. Unsalted nuts and seeds. Unsalted canned beans.  Dairy Low-fat dairy products, such as skim or 1% milk, 2% or reduced-fat cheeses, low-fat ricotta or cottage cheese, or plain low-fat yogurt. Low-sodium or reduced-sodium cheeses.  Fats and Oils Tub margarines without trans fats. Light or reduced-fat mayonnaise and salad dressings (reduced sodium). Avocado. Safflower, olive, or canola oils. Natural peanut or almond butter.  Other Unsalted popcorn and pretzels. The items listed above may not be a complete list of recommended foods or beverages. Contact your dietitian for more options.  +++++++++++++++  WHAT FOODS ARE NOT RECOMMENDED? Grains/ White flour or wheat flour White bread. White pasta. White rice. Refined cornbread. Bagels and croissants. Crackers that contain trans fat.  Vegetables  Creamed or fried vegetables. Vegetables in a . Regular canned vegetables. Regular canned tomato sauce and paste. Regular tomato and vegetable juices.  Fruits Dried fruits. Canned fruit in light or heavy syrup. Fruit juice.  Meat and Other Protein Products Meat in general - RED meat & White meat.  Fatty cuts of meat. Ribs, chicken wings, all processed meats as bacon, sausage, bologna, salami, fatback, hot dogs, bratwurst and packaged luncheon meats.  Dairy Whole or 2% milk, cream, half-and-half, and cream cheese. Whole-fat or sweetened yogurt. Full-fat cheeses or blue cheese. Non-dairy creamers and whipped toppings. Processed cheese, cheese spreads, or cheese curds.  Condiments Onion and garlic salt, seasoned salt,  table salt, and sea salt.  Canned and packaged gravies. Worcestershire sauce. Tartar sauce. Barbecue sauce. Teriyaki sauce. Soy sauce, including reduced sodium. Steak sauce. Fish sauce. Oyster sauce. Cocktail sauce. Horseradish. Ketchup and mustard. Meat flavorings and tenderizers. Bouillon cubes. Hot sauce. Tabasco sauce. Marinades. Taco seasonings. Relishes.  Fats and Oils Butter, stick margarine, lard, shortening and bacon fat. Coconut, palm kernel, or palm oils. Regular salad dressings.  Pickles and olives. Salted popcorn and pretzels.  The items listed above may not be a complete list of foods and beverages to avoid.

## 2021-05-31 NOTE — Progress Notes (Signed)
Future Appointments  Date Time Provider [place]  06/01/2021  9:30 AM Unk Pinto, MD [facility] None  12/08/2021 11:00 AM Unk Pinto, MD [facility] None  02/25/2022  9:30 AM Garnet Sierras, NP [facility] None    History of Present Illness:       This very nice 85 y.o. [name]  presents for 3 month follow up with HTN, HLD, Vascular Dementia, hx/o Prostate cancer (2000),  Pre-Diabetes and Vitamin D Deficiency.  In March 2021, patient was started on Namenda by Dr Krista Blue for mild Dementia. Patient c/o pruritic rash of the proximal  Lt thigh.            On May 9, patient presented to the office with a swollen RLE and venous dopplers the next day showed DVT of the Rt femoral veins & external R Iliac vein and patient was started on Xarelto. Now, 1 month later, swelling of RLE is apparently resolved. Patient is advised that he will need to stay on the Xarelto for at least 6 months.           Patient is followed for labile for HTN (2005)  & BP has been controlled at home. Today's BP is at goal - 122/80. Patient has had no complaints of any cardiac type chest pain, palpitations, dyspnea / orthopnea / PND, dizziness, claudication, or dependent edema.       Hyperlipidemia is controlled with diet & Atorvastatin. Patient denies myalgias or other med SE's. Last Lipids were at goal:  Lab Results  Component Value Date   CHOL 161 02/25/2021   HDL 53 02/25/2021   LDLCALC 91 02/25/2021   TRIG 83 02/25/2021   CHOLHDL 3.0 02/25/2021     Also, the patient has history of PreDiabetes (A1c 5.7% /2013&5.3% /2014)and has had no symptoms of reactive hypoglycemia, diabetic polys, paresthesias or visual blurring.  Last A1c was normal & at goal:  Lab Results  Component Value Date   HGBA1C 5.6 11/25/2020        Further, the patient also has history of Vitamin D Deficiency ("41"/2013)and supplements vitamin D without any suspected side-effects. Last vitamin D was still sl low (goal  70-100):  Lab Results  Component Value Date   VD25OH 45 11/25/2020     Current Outpatient Medications on File Prior to Visit  Medication Sig  . Ascorbic Acid (VITAMIN C PO) Take by mouth daily.  [name] aspirin 81 MG tablet Take h daily.  [name] atorvastatin (LIPITOR) 40 MG tablet Take     1 tablet     Daily  . Cholecalciferol (VITAMIN D PO) Take by mouth daily.  [name] CINNAMON  1,000 mg  Take daily.  . Coenzyme Q10 200 MG TABS Take daily.  . fish oil-omega-3  1000 MG capsule Take 2 g  daily.  [name] FLAXSEED OIL Take 1,000 mg by mouth daily.  . Magnesium 250 MG TABS Take 250 mg by mouth daily.  . Multiple Vitamins-Minerals  Take by mouth daily.  . Multiple Vitamins-Minerals w/ZINC  Take daily  . pseudoephedrine  30 MG tablet Take 30 mg  every 4 hours as needed for congestion.  . rivaroxaban (XARELTO) 20 MG TABS tablet Take 1 tablet daily with supper.  . triamcinolone ointment  0.1 % Apply 1 application topically 2  times daily.  . vitamin C 500 MG tablet Take  daily.  . memantine(NAMENDA) 10 MG tablet Take 1 tablet 2 (two) times daily   No Known Allergies  PMHx:   Past Medical History:  Diagnosis Date  . Cancer [facility])    prostate - basal cell  . GERD (gastroesophageal reflux disease)   . Hearing loss    wear hearing aid  . Hyperlipidemia   . Hypertension   . Inguinal hernia   . Memory loss   . Prediabetes   . Vitamin D deficiency   . Wears glasses   . Wears hearing aid    both ears     Immunization History  Administered Date(s) Administered  . DT (Pediatric) 09/03/2015  . Fluad Quad(high Dose) 10/13/2019  . Influenza Inj Mdck Quad  11/10/2018  . Influenza Split 10/28/2016  . Influenza, High Dose Seasonal PF 09/27/2014, 09/03/2015, 10/05/2017  . Influenza,inj,Quad PF,6+ Mos 10/11/2018  . PFIZER SARS-COV-2 Vacc  01/17/2020, 02/07/2020  . Pneumococcal -13 10/16/2014  . Pneumococcal-23 02/03/2011  . Td 07/26/2005, 08/07/2018  . Zoster, Live 12/27/2006     Past Surgical  History:  Procedure Laterality Date  . COLONOSCOPY     x2  . INGUINAL HERNIA REPAIR  01/15/2013   Procedure: HERNIA REPAIR INGUINAL ADULT;  Surgeon: Gwenyth Ober, MD;  Location: [place];  Service: General;  Laterality: Right;  . [place] or 2000   due to cancer  . TONSILLECTOMY     as a child    FHx:    Reviewed / unchanged  SHx:    Reviewed / unchanged   Systems Review:  Constitutional: Denies fever, chills, wt changes, headaches, insomnia, fatigue, night sweats, change in appetite. Eyes: Denies redness, blurred vision, diplopia, discharge, itchy, watery eyes.  ENT: Denies discharge, congestion, post nasal drip, epistaxis, sore throat, earache, hearing loss, dental pain, tinnitus, vertigo, sinus pain, snoring.  CV: Denies chest pain, palpitations, irregular heartbeat, syncope, dyspnea, diaphoresis, orthopnea, PND, claudication or edema. Respiratory: denies cough, dyspnea, DOE, pleurisy, hoarseness, laryngitis, wheezing.  Gastrointestinal: Denies dysphagia, odynophagia, heartburn, reflux, water brash, abdominal pain or cramps, nausea, vomiting, bloating, diarrhea, constipation, hematemesis, melena, hematochezia  or hemorrhoids. Genitourinary: Denies dysuria, frequency, urgency, nocturia, hesitancy, discharge, hematuria or flank pain. Musculoskeletal: Denies arthralgias, myalgias, stiffness, jt. swelling, pain, limping or strain/sprain.  Skin: Denies pruritus, rash, hives, warts, acne, eczema or change in skin lesion(s). Neuro: No weakness, tremor, incoordination, spasms, paresthesia or pain. Psychiatric: Denies confusion, memory loss or sensory loss. Endo: Denies change in weight, skin or hair change.  Heme/Lymph: No excessive bleeding, bruising or enlarged lymph nodes.  Physical Exam  BP 122/80 (BP Location: Right Arm, Patient Position: Sitting, Cuff Size: Normal)   Pulse 65   Temp (!) 97.3 F (36.3 C)   Resp 17   Ht 5\' 10"  (1.778 m)   Wt 149  lb 3.2 oz (67.7 kg)   SpO2 98%   BMI 21.41 kg/m   Appears  well nourished, well groomed  and in no distress.  Eyes: PERRLA, EOMs, conjunctiva no swelling or erythema. Sinuses: No frontal/maxillary tenderness ENT/Mouth: EAC's clear, TM's nl w/o erythema, bulging. Nares clear w/o erythema, swelling, exudates. Oropharynx clear without erythema or exudates. Oral hygiene is good. Tongue normal, non obstructing. Hearing intact.  Neck: Supple. Thyroid not palpable. Car 2+/2+ without bruits, nodes or JVD. Chest: Respirations nl with BS clear & equal w/o rales, rhonchi, wheezing or stridor.  Cor: Heart sounds normal w/ regular rate and rhythm without sig. murmurs, gallops, clicks or rubs. Peripheral pulses normal and equal  without edema.  Abdomen: Soft & bowel sounds normal. Non-tender w/o guarding, rebound, hernias,  masses or organomegaly.  Lymphatics: Unremarkable.  Musculoskeletal: Full ROM all peripheral extremities, joint stability, 5/5 strength and normal gait.  Skin: Warm, dry without exposed  lesions or ecchymosis apparent. Has area or eczema of Left lateral proximal thigh.  Neuro: Cranial nerves intact, reflexes equal bilaterally. Sensory-motor testing grossly intact. Tendon reflexes grossly intact.  Pysch: Alert & oriented x 3.  Insight and judgement nl & appropriate. No ideations.  Assessment and Plan:  1. Labile hypertension  - Continue medication, monitor blood pressure at home.  - Continue DASH diet.  Reminder to go to the ER if any CP,  SOB, nausea, dizziness, severe HA, changes vision/speech.  - CBC with Differential/Platelet - COMPLETE METABOLIC PANEL WITH GFR - Magnesium - TSH  2. Hyperlipidemia, mixed  - Continue diet/meds, exercise,& lifestyle modifications.  - Continue monitor periodic cholesterol/liver & renal functions   - Lipid panel - TSH  3. Abnormal glucose  - Continue diet, exercise  - Lifestyle modifications.  - Monitor appropriate labs  -  Hemoglobin A1c - Insulin, random  4. Vitamin D deficiency  - Continue supplementation.  - VITAMIN D 25 Hydroxy  5. Acute deep vein thrombosis (DVT) of  iliac vein of right lower extremity (HCC)   6. Vascular dementia without behavioral disturbance (HCC)  - Lipid panel - D-dimer, quantitative  7. Medication management  - CBC with Differential/Platelet - COMPLETE METABOLIC PANEL WITH GFR - Magnesium - Lipid panel - TSH - Hemoglobin A1c - Insulin, random - VITAMIN D 25 Hydroxy         Discussed  regular exercise, BP monitoring, weight control to achieve/maintain BMI less than 25 and discussed med and SE's. Recommended labs to assess and monitor clinical status with further disposition pending results of labs.  I discussed the assessment and treatment plan with the patient. The patient was provided an opportunity to ask questions and all were answered. The patient agreed with the plan and demonstrated an understanding of the instructions.  I provided over 30 minutes of exam, counseling, chart review and  complex critical decision making.         The patient was advised to call back or seek an in-person evaluation if the symptoms worsen or if the condition fails to improve as anticipated.   Kirtland Bouchard, MD

## 2021-06-01 ENCOUNTER — Encounter: Payer: Self-pay | Admitting: Internal Medicine

## 2021-06-01 ENCOUNTER — Ambulatory Visit (INDEPENDENT_AMBULATORY_CARE_PROVIDER_SITE_OTHER): Payer: Medicare Other | Admitting: Internal Medicine

## 2021-06-01 ENCOUNTER — Other Ambulatory Visit: Payer: Self-pay

## 2021-06-01 VITALS — BP 122/80 | HR 65 | Temp 97.3°F | Resp 17 | Ht 70.0 in | Wt 149.2 lb

## 2021-06-01 DIAGNOSIS — F0150 Vascular dementia without behavioral disturbance: Secondary | ICD-10-CM | POA: Diagnosis not present

## 2021-06-01 DIAGNOSIS — Z79899 Other long term (current) drug therapy: Secondary | ICD-10-CM | POA: Diagnosis not present

## 2021-06-01 DIAGNOSIS — R7309 Other abnormal glucose: Secondary | ICD-10-CM

## 2021-06-01 DIAGNOSIS — E782 Mixed hyperlipidemia: Secondary | ICD-10-CM | POA: Diagnosis not present

## 2021-06-01 DIAGNOSIS — R0989 Other specified symptoms and signs involving the circulatory and respiratory systems: Secondary | ICD-10-CM

## 2021-06-01 DIAGNOSIS — I82421 Acute embolism and thrombosis of right iliac vein: Secondary | ICD-10-CM | POA: Diagnosis not present

## 2021-06-01 DIAGNOSIS — E559 Vitamin D deficiency, unspecified: Secondary | ICD-10-CM | POA: Diagnosis not present

## 2021-06-01 DIAGNOSIS — L259 Unspecified contact dermatitis, unspecified cause: Secondary | ICD-10-CM | POA: Diagnosis not present

## 2021-06-01 MED ORDER — RIVAROXABAN 20 MG PO TABS: 20.0000 mg | ORAL_TABLET | Freq: Every day | ORAL | 1 refills | Status: DC

## 2021-06-01 MED ORDER — TRIAMCINOLONE ACETONIDE 0.1 % EX OINT: 1.0000 "application " | TOPICAL_OINTMENT | Freq: Two times a day (BID) | CUTANEOUS | 3 refills | Status: AC

## 2021-06-02 LAB — HEMOGLOBIN A1C
Hgb A1c MFr Bld: 5.3 % of total Hgb (ref <5.7)
Mean Plasma Glucose: 105 mg/dL
eAG (mmol/L): 5.8 mmol/L

## 2021-06-02 LAB — COMPLETE METABOLIC PANEL WITH GFR
AG Ratio: 1.8 (calc) (ref 1.0–2.5)
ALT: 23 U/L (ref 9–46)
AST: 25 U/L (ref 10–35)
Albumin: 4.1 g/dL (ref 3.6–5.1)
Alkaline phosphatase (APISO): 71 U/L (ref 35–144)
BUN/Creatinine Ratio: 17 (calc) (ref 6–22)
BUN: 20 mg/dL (ref 7–25)
CO2: 28 mmol/L (ref 20–32)
Calcium: 9.5 mg/dL (ref 8.6–10.3)
Chloride: 107 mmol/L (ref 98–110)
Creat: 1.16 mg/dL — ABNORMAL HIGH (ref 0.70–1.11)
GFR, Est African American: 67 mL/min/{1.73_m2} (ref >=60)
GFR, Est Non African American: 58 mL/min/{1.73_m2} — ABNORMAL LOW (ref >=60)
Globulin: 2.3 g/dL (calc) (ref 1.9–3.7)
Glucose, Bld: 95 mg/dL (ref 65–99)
Potassium: 4.5 mmol/L (ref 3.5–5.3)
Sodium: 143 mmol/L (ref 135–146)
Total Bilirubin: 0.8 mg/dL (ref 0.2–1.2)
Total Protein: 6.4 g/dL (ref 6.1–8.1)

## 2021-06-02 LAB — CBC WITH DIFFERENTIAL/PLATELET
Absolute Monocytes: 650 cells/uL (ref 200–950)
Basophils Absolute: 68 cells/uL (ref 0–200)
Basophils Relative: 0.7 %
Eosinophils Absolute: 407 cells/uL (ref 15–500)
Eosinophils Relative: 4.2 %
HCT: 46.2 % (ref 38.5–50.0)
Hemoglobin: 14.7 g/dL (ref 13.2–17.1)
Lymphs Abs: 1892 cells/uL (ref 850–3900)
MCH: 29.5 pg (ref 27.0–33.0)
MCHC: 31.8 g/dL — ABNORMAL LOW (ref 32.0–36.0)
MCV: 92.6 fL (ref 80.0–100.0)
MPV: 10.7 fL (ref 7.5–12.5)
Monocytes Relative: 6.7 %
Neutro Abs: 6683 cells/uL (ref 1500–7800)
Neutrophils Relative %: 68.9 %
Platelets: 278 10*3/uL (ref 140–400)
RBC: 4.99 10*6/uL (ref 4.20–5.80)
RDW: 12.3 % (ref 11.0–15.0)
Total Lymphocyte: 19.5 %
WBC: 9.7 10*3/uL (ref 3.8–10.8)

## 2021-06-02 LAB — D-DIMER, QUANTITATIVE: D-Dimer, Quant: 0.97 mcg/mL FEU — ABNORMAL HIGH (ref <0.50)

## 2021-06-02 LAB — LIPID PANEL
Cholesterol: 142 mg/dL (ref <200)
HDL: 45 mg/dL (ref >=40)
LDL Cholesterol (Calc): 78 mg/dL (calc)
Non-HDL Cholesterol (Calc): 97 mg/dL (calc) (ref <130)
Total CHOL/HDL Ratio: 3.2 (calc) (ref <5.0)
Triglycerides: 102 mg/dL (ref <150)

## 2021-06-02 LAB — VITAMIN D 25 HYDROXY (VIT D DEFICIENCY, FRACTURES): Vit D, 25-Hydroxy: 59 ng/mL (ref 30–100)

## 2021-06-02 LAB — TSH: TSH: 3.61 mIU/L (ref 0.40–4.50)

## 2021-06-02 LAB — INSULIN, RANDOM: Insulin: 22.2 u[IU]/mL — ABNORMAL HIGH

## 2021-06-02 LAB — MAGNESIUM: Magnesium: 2.2 mg/dL (ref 1.5–2.5)

## 2021-06-02 IMAGING — MR MRI HEAD WITHOUT CONTRAST
10 series · 48 of 48 positions shown · non-contrast
Comparison: None.

CLINICAL DATA: Memory change and history of prostate cancer.

EXAM:
MRI HEAD WITHOUT CONTRAST
TECHNIQUE: Multiplanar, multiecho pulse sequences of the brain and surrounding
structures were obtained without intravenous contrast.

[Series 2: t1_se_sag · sagittal · 5.0mm · 0.45mm/px · 3 of 23 slices shown]
[im 1/23]
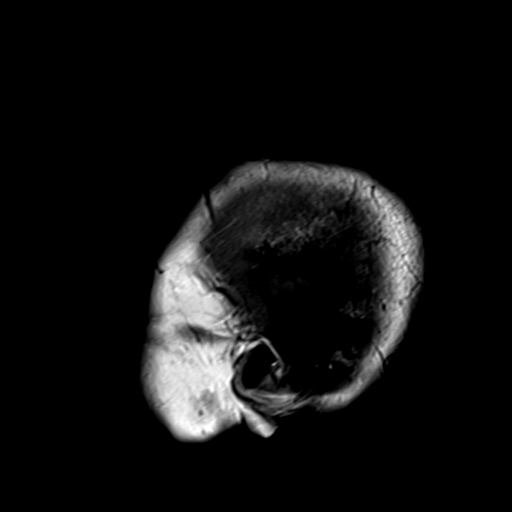
[im 12/23]
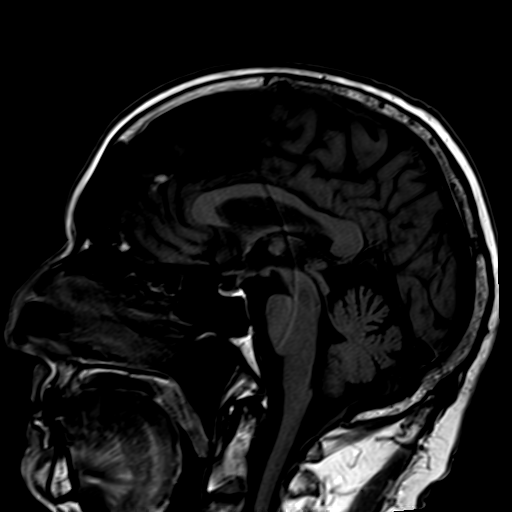
[im 23/23]
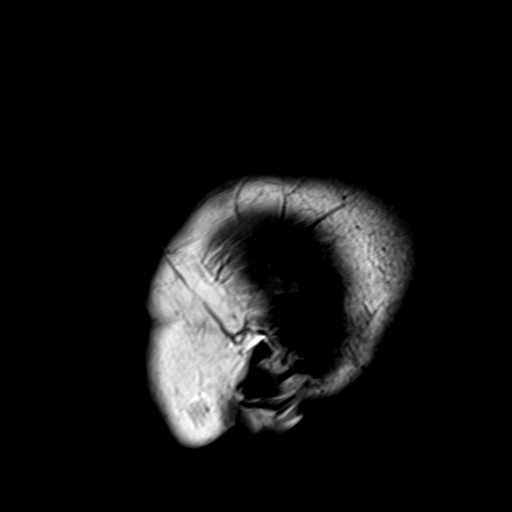

[Series 3: ep2d_diff_3 · axial · 3.0mm · 1.88mm/px · z∈[-45,+112]mm · 9 of 105 slices shown]
[im 1/105]
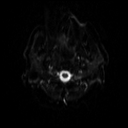
[im 14/105]
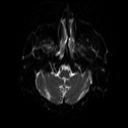
[im 27/105]
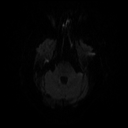
[im 40/105]
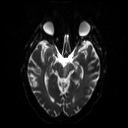
[im 53/105]
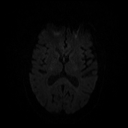
[im 66/105]
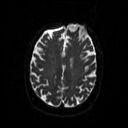
[im 79/105]
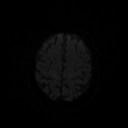
[im 92/105]
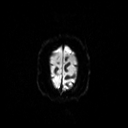
[im 105/105]
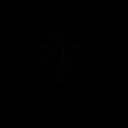

[Series 4: ep2d_diff_3_adc · axial · 3.0mm · 1.88mm/px · z∈[-45,+115]mm · 4 of 54 slices shown]
[im 1/54]
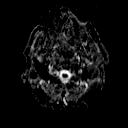
[im 18/54]
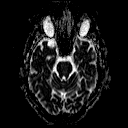
[im 36/54]
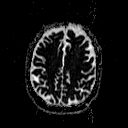
[im 54/54]
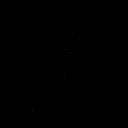

[Series 5: FLAIR · axial · 3.0mm · 0.45mm/px · z∈[-40,+108]mm · 2 of 26 slices shown]
[im 1/26]
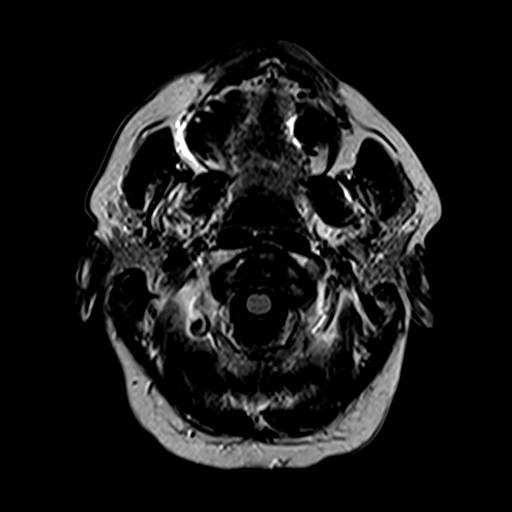
[im 26/26]
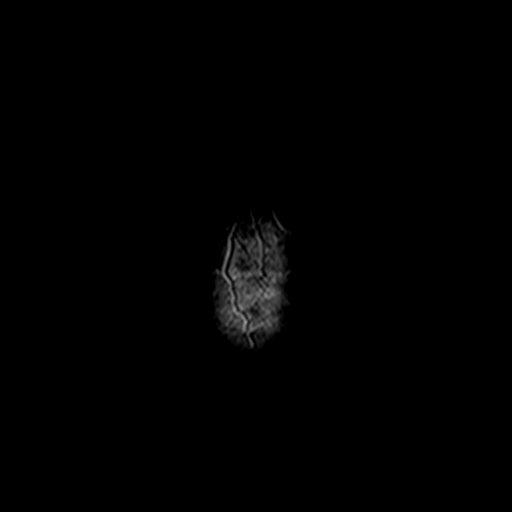

[Series 7: t1_mpr_tra · axial · 1.0mm · 0.75mm/px · z∈[-43,+113]mm · 13 of 160 slices shown]
[im 1/160]
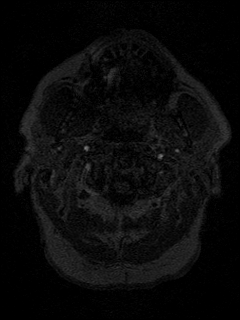
[im 14/160]
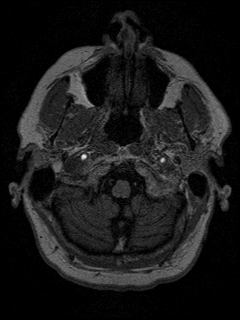
[im 27/160]
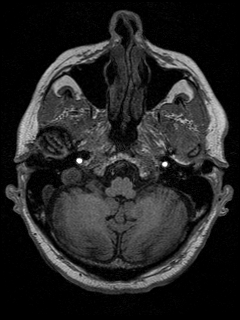
[im 40/160]
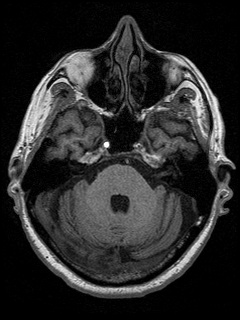
[im 54/160]
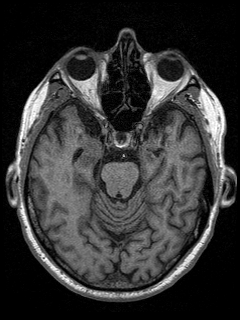
[im 67/160]
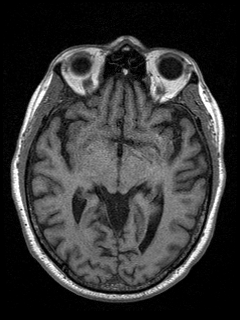
[im 80/160]
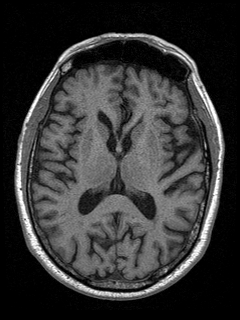
[im 93/160]
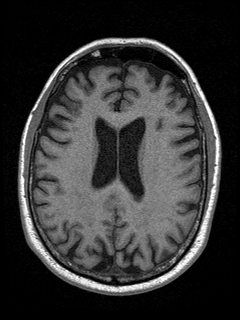
[im 107/160]
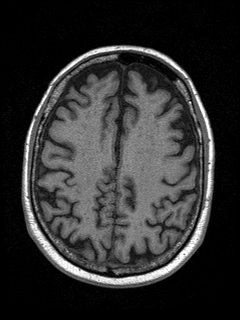
[im 120/160]
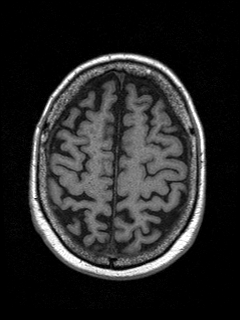
[im 133/160]
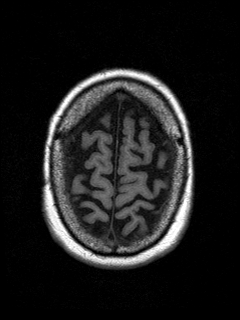
[im 146/160]
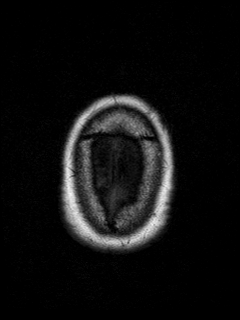
[im 160/160]
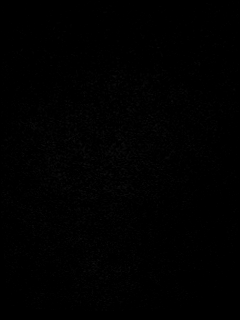

[Series 9: swi_images · axial · 2.0mm · 0.94mm/px · z∈[-43,+113]mm · 6 of 80 slices shown]
[im 1/80]
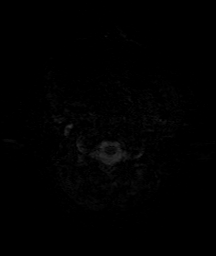
[im 16/80]
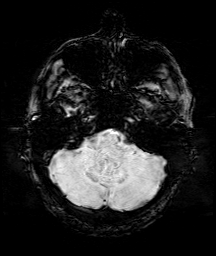
[im 32/80]
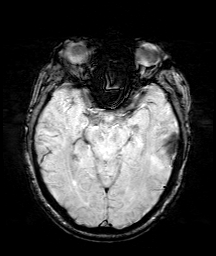
[im 48/80]
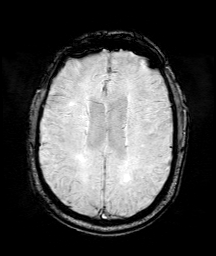
[im 64/80]
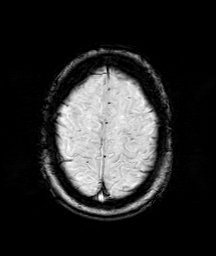
[im 80/80]
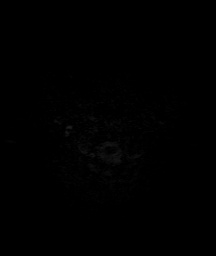

[Series 10: ep2d_diff_cor · coronal · 5.0mm · 1.77mm/px · 5 of 61 slices shown]
[im 1/61]
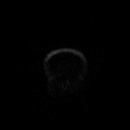
[im 16/61]
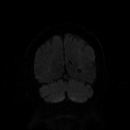
[im 31/61]
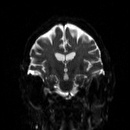
[im 46/61]
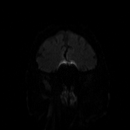
[im 61/61]
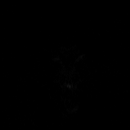

[Series 11: ep2d_diff_cor_adc · coronal · 5.0mm · 1.77mm/px · 2 of 31 slices shown]
[im 1/31]
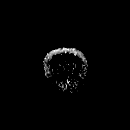
[im 31/31]
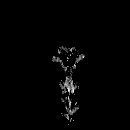

[Series 12: T2 · coronal · 5.0mm · 0.45mm/px · 2 of 30 slices shown]
[im 1/30]
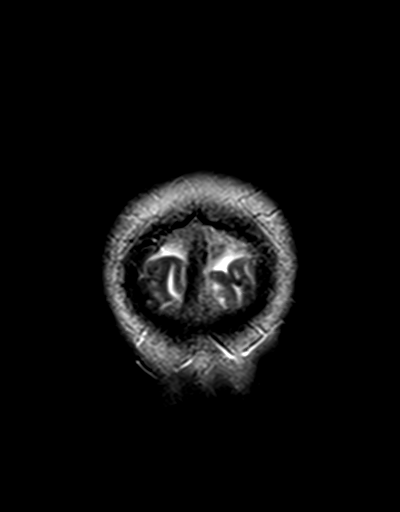
[im 30/30]
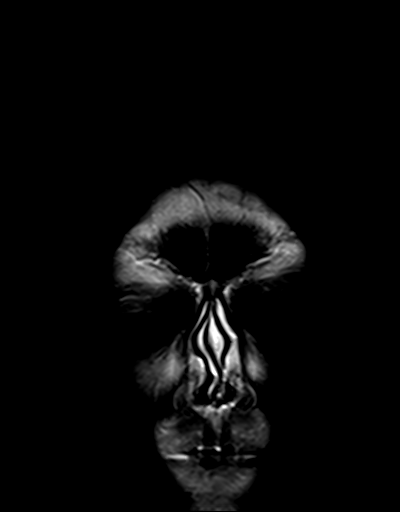

[Series 13: t2_tse_tra · axial · 5.0mm · 0.60mm/px · z∈[-40,+108]mm · 2 of 26 slices shown]
[im 1/26]
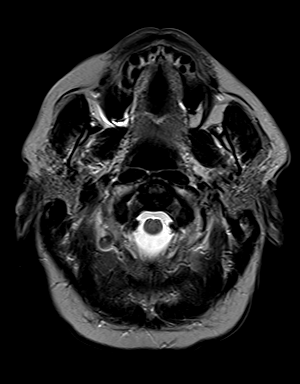
[im 26/26]
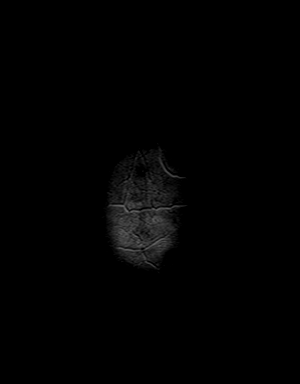

[48 of 48 positions shown; findings below may reference images not displayed]

FINDINGS: BRAIN: There is no acute infarct, acute hemorrhage or extra-axial
collection. Multifocal white matter hyperintensity, most commonly
due to chronic ischemic microangiopathy. The cerebral and cerebellar
volume are age-appropriate. There is no hydrocephalus. The midline
structures are normal.

VASCULAR: Susceptibility-sensitive sequences show no chronic
microhemorrhage or superficial siderosis. The major intracranial
arterial and venous sinus flow voids are normal.

SKULL AND UPPER CERVICAL SPINE: Calvarial bone marrow signal is
normal. There is no skull base mass. The visualized upper cervical
spine and soft tissues are normal.

SINUSES/ORBITS: There are no fluid levels or advanced mucosal
thickening. The mastoid air cells and middle ear cavities are free
of fluid. The orbits are normal.

## 2021-06-02 NOTE — Progress Notes (Signed)
============================================================ -   Test results slightly outside the reference range are not unusual. If there is anything important, I will review this with you,  otherwise it is considered normal test values.  If you have further questions,  please do not hesitate to contact me at the office or via My Chart.  ============================================================ ============================================================  -  Total Chol = 142 -  Excellent   - Very low risk for Heart Attack  / Stroke ============================================================= =============================================================  - A1c - Normal - Great - No Diabetes  ! ============================================================= =============================================================  - Vitamin D = 59 - Great   ! ============================================================= =============================================================  - D-Dimer  (Blood clot test  was elevated at 7.5 and                                                                   now is down to 0.97                                                                                      (  normal is less than 0.5  )  - So please schedule a follow-up office visit in  a month for continued follow-up ============================================================= =============================================================  - All Else - CBC - Kidneys - Electrolytes - Liver - Magnesium & Thyroid    - all  Normal / OK ============================================================= =============================================================

## 2021-06-17 DIAGNOSIS — L298 Other pruritus: Secondary | ICD-10-CM | POA: Diagnosis not present

## 2021-06-17 DIAGNOSIS — L249 Irritant contact dermatitis, unspecified cause: Secondary | ICD-10-CM | POA: Diagnosis not present

## 2021-07-22 DIAGNOSIS — D485 Neoplasm of uncertain behavior of skin: Secondary | ICD-10-CM | POA: Diagnosis not present

## 2021-07-22 DIAGNOSIS — D692 Other nonthrombocytopenic purpura: Secondary | ICD-10-CM | POA: Diagnosis not present

## 2021-07-22 DIAGNOSIS — L570 Actinic keratosis: Secondary | ICD-10-CM | POA: Diagnosis not present

## 2021-07-22 DIAGNOSIS — L308 Other specified dermatitis: Secondary | ICD-10-CM | POA: Diagnosis not present

## 2021-08-20 DIAGNOSIS — L298 Other pruritus: Secondary | ICD-10-CM | POA: Diagnosis not present

## 2021-08-20 DIAGNOSIS — L821 Other seborrheic keratosis: Secondary | ICD-10-CM | POA: Diagnosis not present

## 2021-08-20 DIAGNOSIS — Z85828 Personal history of other malignant neoplasm of skin: Secondary | ICD-10-CM | POA: Diagnosis not present

## 2021-08-20 DIAGNOSIS — L819 Disorder of pigmentation, unspecified: Secondary | ICD-10-CM | POA: Diagnosis not present

## 2021-08-20 DIAGNOSIS — L570 Actinic keratosis: Secondary | ICD-10-CM | POA: Diagnosis not present

## 2021-08-20 DIAGNOSIS — L814 Other melanin hyperpigmentation: Secondary | ICD-10-CM | POA: Diagnosis not present

## 2021-08-20 DIAGNOSIS — L905 Scar conditions and fibrosis of skin: Secondary | ICD-10-CM | POA: Diagnosis not present

## 2021-08-20 DIAGNOSIS — D1801 Hemangioma of skin and subcutaneous tissue: Secondary | ICD-10-CM | POA: Diagnosis not present

## 2021-08-24 DIAGNOSIS — Z20822 Contact with and (suspected) exposure to covid-19: Secondary | ICD-10-CM | POA: Diagnosis not present

## 2021-09-07 NOTE — Progress Notes (Signed)
FOLLOW UP  Assessment and Plan:   Hypertension Well controlled with current medications  Monitor blood pressure at home; patient to call if consistently greater than 130/80 Continue DASH diet.   Reminder to go to the ER if any CP, SOB, nausea, dizziness, severe HA, changes vision/speech, left arm numbness and tingling and jaw pain.  Cholesterol Currently at goal;  Continue low cholesterol diet and exercise.  Check lipid panel.   Abnormal glucose Continue medication: Continue diet and exercise.   Vascular dementia without behavioral disturbances Had normal basic labs, smoker 40+ pack year, had MRI suggestive of microvascular changes Follows with Dr. Concha Se , Is on Aricept  He is on bASA, high dose statin. Discussed regular exercise/activity which he is doing very well with.  Spoke with wife on phone, states his driving has been ok if he has GPS directions.  Will think about possibly discontinuing driving if memory worsens further.  Also discussed weight loss and importance of eating regular balanced meals.  GERD Currently well controlled with lifestyle changes - avoiding triggers, excessively large meals, lying down after eating Check Magnesium  Vitamin D Def Continue supplementation and routine monitoring At goal at recent check; continue to recommend supplementation for goal of 60-100 Defer vitamin D level  Anemia/Bruising Check CBC, Iron total and TIBC Monitor bruising  Weight loss unintentional Sent diet information home with wife Encouraged frequent/regular meals  Continue diet and meds as discussed. Further disposition pending results of labs. Discussed med's effects and SE's.   Over 30 minutes of exam, counseling, chart review, and critical decision making was performed.   Future Appointments  Date Time Provider [place]  12/08/2021 11:00 AM Unk Pinto, MD [facility] None  02/25/2022  9:30 AM Magda Bernheim, NP [facility] None     ----------------------------------------------------------------------------------------------------------------------  HPI 85 y.o. male  presents for 3 month follow up on hypertension, cholesterol, diabetes, weight and vitamin D deficiency.   BMI is Body mass index is 20.29 kg/m., he has not been working on diet and exercise. Wt Readings from Last 3 Encounters:  09/08/21 141 lb 6.4 oz (64.1 kg)  06/01/21 149 lb 3.2 oz (67.7 kg)  05/04/21 150 lb 9.6 oz (68.3 kg)    His blood pressure has been controlled at home, today their BP is BP: 102/70  He does workout. He denies chest pain, shortness of breath, dizziness.   He is on cholesterol medication Atorvastatin and denies myalgias. His cholesterol is at goal. The cholesterol last visit was:   Lab Results  Component Value Date   CHOL 142 06/01/2021   HDL 45 06/01/2021   LDLCALC 78 06/01/2021   TRIG 102 06/01/2021   CHOLHDL 3.2 06/01/2021    He has not been working on diet and exercise for abnormal glucose Lab Results  Component Value Date   HGBA1C 5.3 06/01/2021     Patient is on Vitamin D supplement.   Lab Results  Component Value Date   VD25OH 59 0000000     Pt is uncertain of medications he is taking.  He does not recall year.  He is still driving and drove here today.  States he is eating well but a 8 pound weight loss is noticed from 3 months ago    Current Medications:  Current Outpatient Medications on File Prior to Visit  Medication Sig   Ascorbic Acid (VITAMIN C PO) Take by mouth daily.   aspirin 81 MG tablet Take 81 mg by mouth daily.   atorvastatin (LIPITOR) 40  MG tablet Take     1 tablet     Daily        for Cholesterol   Cholecalciferol (VITAMIN D PO) Take by mouth daily.   CINNAMON PO Take 1,000 mg by mouth daily.   Coenzyme Q10 200 MG TABS Take 200 mg by mouth daily.   fish oil-omega-3 fatty acids 1000 MG capsule Take 2 g by mouth daily.   Flaxseed, Linseed, (FLAXSEED OIL PO) Take 1,000 mg by mouth  daily.   Magnesium 250 MG TABS Take 250 mg by mouth daily.   Multiple Vitamins-Minerals (MULTIVITAMIN PO) Take by mouth daily.   Multiple Vitamins-Minerals (ZINC PO) Take by mouth.   rivaroxaban (XARELTO) 20 MG TABS tablet Take 1 tablet (20 mg total) by mouth daily with supper. Take 1 tablet Daily to Prevent Blood Clots   triamcinolone ointment (KENALOG) 0.1 % Apply 1 application topically 2 (two) times daily. Apply to Rash 2 to 3 x /day   vitamin C (ASCORBIC ACID) 500 MG tablet Take 500 mg by mouth daily.   pseudoephedrine (SUDAFED) 30 MG tablet Take 30 mg by mouth every 4 (four) hours as needed for congestion. (Patient not taking: Reported on 09/08/2021)   No current facility-administered medications on file prior to visit.     Allergies: No Known Allergies   Medical History:  Past Medical History:  Diagnosis Date   Cancer ([place])    prostate - basal cell   GERD (gastroesophageal reflux disease)    Hearing loss    wear hearing aid   Hyperlipidemia    Hypertension    Inguinal hernia    Memory loss    Prediabetes    Vitamin D deficiency    Wears glasses    Wears hearing aid    both ears   Family history- Reviewed and unchanged Social history- Reviewed and unchanged   Review of Systems:  Review of Systems  Constitutional:  Positive for weight loss. Negative for chills and fever.  HENT:  Positive for hearing loss. Negative for congestion, sinus pain and sore throat.   Eyes:  Negative for blurred vision.  Respiratory:  Negative for cough, shortness of breath and wheezing.   Cardiovascular:  Negative for chest pain, palpitations and leg swelling.  Gastrointestinal:  Negative for abdominal pain, diarrhea, heartburn, nausea and vomiting.  Genitourinary:  Negative for dysuria.  Musculoskeletal:  Negative for back pain, joint pain and neck pain.  Skin:        Skin tears of right elbow and right shin  Neurological:  Negative for dizziness and headaches.  Psychiatric/Behavioral:   Positive for memory loss. Negative for depression.      Physical Exam: BP 102/70   Pulse 66   Temp (!) 97.5 F (36.4 C)   Wt 141 lb 6.4 oz (64.1 kg)   SpO2 97%   BMI 20.29 kg/m  Wt Readings from Last 3 Encounters:  09/08/21 141 lb 6.4 oz (64.1 kg)  06/01/21 149 lb 3.2 oz (67.7 kg)  05/04/21 150 lb 9.6 oz (68.3 kg)   General Appearance: Thin, in no apparent distress. Eyes: PERRLA, EOMs, conjunctiva no swelling or erythema Sinuses: No Frontal/maxillary tenderness ENT/Mouth: Ext aud canals clear, TMs without erythema, bulging. No erythema, swelling, or exudate on post pharynx.  Tonsils not swollen or erythematous. Hearing normal.  Neck: Supple, thyroid normal.  Respiratory: Respiratory effort normal, BS equal bilaterally without rales, rhonchi, wheezing or stridor.  Cardio: RRR with no MRGs. Brisk peripheral pulses without edema.  Abdomen: Soft, + BS.  Non tender, no guarding, rebound, hernias, masses. Lymphatics: Non tender without lymphadenopathy.  Musculoskeletal: Full ROM, 5/5 strength, Normal gait Skin: Warm, dry. Has skin tears of right elbow and right shin, no signs of infection.  Neuro: Cranial nerves intact. No cerebellar symptoms.  Psych: Awake and oriented X 2( person and place), normal affect   Kellyann Ordway Kathyrn Drown, NP 11:50 AM [facility]

## 2021-09-08 ENCOUNTER — Other Ambulatory Visit: Payer: Self-pay

## 2021-09-08 ENCOUNTER — Ambulatory Visit (INDEPENDENT_AMBULATORY_CARE_PROVIDER_SITE_OTHER): Payer: Medicare Other | Admitting: Nurse Practitioner

## 2021-09-08 ENCOUNTER — Encounter: Payer: Self-pay | Admitting: Nurse Practitioner

## 2021-09-08 VITALS — BP 102/70 | HR 66 | Temp 97.5°F | Wt 141.4 lb

## 2021-09-08 DIAGNOSIS — K219 Gastro-esophageal reflux disease without esophagitis: Secondary | ICD-10-CM | POA: Diagnosis not present

## 2021-09-08 DIAGNOSIS — I10 Essential (primary) hypertension: Secondary | ICD-10-CM

## 2021-09-08 DIAGNOSIS — E559 Vitamin D deficiency, unspecified: Secondary | ICD-10-CM | POA: Diagnosis not present

## 2021-09-08 DIAGNOSIS — R634 Abnormal weight loss: Secondary | ICD-10-CM | POA: Diagnosis not present

## 2021-09-08 DIAGNOSIS — R7309 Other abnormal glucose: Secondary | ICD-10-CM

## 2021-09-08 DIAGNOSIS — E782 Mixed hyperlipidemia: Secondary | ICD-10-CM | POA: Diagnosis not present

## 2021-09-08 DIAGNOSIS — T148XXA Other injury of unspecified body region, initial encounter: Secondary | ICD-10-CM | POA: Diagnosis not present

## 2021-09-08 DIAGNOSIS — D509 Iron deficiency anemia, unspecified: Secondary | ICD-10-CM | POA: Diagnosis not present

## 2021-09-08 DIAGNOSIS — F0150 Vascular dementia without behavioral disturbance: Secondary | ICD-10-CM

## 2021-09-08 DIAGNOSIS — J449 Chronic obstructive pulmonary disease, unspecified: Secondary | ICD-10-CM

## 2021-09-08 NOTE — Patient Instructions (Signed)
Weight loss of 8 pounds in 3 months, encourage eating at least 3 meals a dAY   Follow these instructions at home:  Eat a balanced diet. In each meal, include at least one food that is high in protein. Foods that are high in protein include: Meat. Poultry. Fish. Eggs. Cheese. Milk. Beans. Nuts. Eat nutrient-rich foods that are easy to swallow and digest, such as: Fruit and yogurt smoothies. Oatmeal with nut butter. Nutrition supplement drinks. Try to eat six small meals each day instead of three large meals. Take vitamin and protein supplements as told by your health care provider or dietitian. Follow your health care provider's recommendations about exercise and activity. Keep all follow-up visits. This is important. Contact a health care provider if: You have increased weakness or fatigue. You faint. You are a woman and you stop having your period (menstruating). You have rapid hair loss. You have unexpected weight loss. You have diarrhea. You have nausea and vomiting. Get help right away if: You have difficulty breathing. You have chest pain. These symptoms may represent a serious problem that is an emergency. Do not wait to see if the symptoms will go away. Get medical help right away. Call your local emergency services (911 in the [place] Do not drive yourself to the hospital. Summary Protein-energy malnutrition is when a person does not eat enough protein, fat, and calories. Protein-energy malnutrition can lead to severe loss of muscle tissue (muscle wasting). This condition also affects the body's defense system (immune system) and can lead to other health problems. Talk with your health care provider about treatment for this condition. Effective treatment depends on the underlying cause of the malnutrition. This information is not intended to replace advice given to you by your health care provider. Make sure you discuss any questions you have with your health care  provider. Document Revised: 12/13/2020 Document Reviewed: 12/13/2020 Elsevier Patient Education  2022 [employer].

## 2021-09-09 LAB — CBC WITH DIFFERENTIAL/PLATELET
Absolute Monocytes: 664 cells/uL (ref 200–950)
Basophils Absolute: 74 cells/uL (ref 0–200)
Basophils Relative: 0.9 %
Eosinophils Absolute: 279 cells/uL (ref 15–500)
Eosinophils Relative: 3.4 %
HCT: 46.6 % (ref 38.5–50.0)
Hemoglobin: 15.7 g/dL (ref 13.2–17.1)
Lymphs Abs: 1829 cells/uL (ref 850–3900)
MCH: 31.3 pg (ref 27.0–33.0)
MCHC: 33.7 g/dL (ref 32.0–36.0)
MCV: 92.8 fL (ref 80.0–100.0)
MPV: 10.4 fL (ref 7.5–12.5)
Monocytes Relative: 8.1 %
Neutro Abs: 5355 cells/uL (ref 1500–7800)
Neutrophils Relative %: 65.3 %
Platelets: 305 10*3/uL (ref 140–400)
RBC: 5.02 10*6/uL (ref 4.20–5.80)
RDW: 13.1 % (ref 11.0–15.0)
Total Lymphocyte: 22.3 %
WBC: 8.2 10*3/uL (ref 3.8–10.8)

## 2021-09-09 LAB — LIPID PANEL
Cholesterol: 163 mg/dL (ref <200)
HDL: 52 mg/dL (ref >=40)
LDL Cholesterol (Calc): 90 mg/dL (calc)
Non-HDL Cholesterol (Calc): 111 mg/dL (calc) (ref <130)
Total CHOL/HDL Ratio: 3.1 (calc) (ref <5.0)
Triglycerides: 112 mg/dL (ref <150)

## 2021-09-09 LAB — COMPLETE METABOLIC PANEL WITH GFR
AG Ratio: 1.8 (calc) (ref 1.0–2.5)
ALT: 25 U/L (ref 9–46)
AST: 27 U/L (ref 10–35)
Albumin: 4.2 g/dL (ref 3.6–5.1)
Alkaline phosphatase (APISO): 70 U/L (ref 35–144)
BUN: 13 mg/dL (ref 7–25)
CO2: 31 mmol/L (ref 20–32)
Calcium: 9.7 mg/dL (ref 8.6–10.3)
Chloride: 107 mmol/L (ref 98–110)
Creat: 1.1 mg/dL (ref 0.70–1.22)
Globulin: 2.3 g/dL (calc) (ref 1.9–3.7)
Glucose, Bld: 95 mg/dL (ref 65–99)
Potassium: 5.2 mmol/L (ref 3.5–5.3)
Sodium: 145 mmol/L (ref 135–146)
Total Bilirubin: 0.7 mg/dL (ref 0.2–1.2)
Total Protein: 6.5 g/dL (ref 6.1–8.1)
eGFR: 67 mL/min/{1.73_m2} (ref >=60)

## 2021-09-09 LAB — IRON, TOTAL/TOTAL IRON BINDING CAP
%SAT: 39 % (calc) (ref 20–48)
Iron: 127 ug/dL (ref 50–180)
TIBC: 325 mcg/dL (calc) (ref 250–425)

## 2021-09-09 LAB — MAGNESIUM: Magnesium: 2.4 mg/dL (ref 1.5–2.5)

## 2021-09-15 DIAGNOSIS — F0390 Unspecified dementia without behavioral disturbance: Secondary | ICD-10-CM | POA: Diagnosis not present

## 2021-10-21 DIAGNOSIS — Z23 Encounter for immunization: Secondary | ICD-10-CM | POA: Diagnosis not present

## 2021-11-10 ENCOUNTER — Other Ambulatory Visit: Payer: Self-pay | Admitting: Internal Medicine

## 2021-11-27 ENCOUNTER — Other Ambulatory Visit: Payer: Self-pay | Admitting: Internal Medicine

## 2021-11-27 DIAGNOSIS — I82421 Acute embolism and thrombosis of right iliac vein: Secondary | ICD-10-CM

## 2021-12-07 ENCOUNTER — Encounter: Payer: Self-pay | Admitting: Internal Medicine

## 2021-12-07 NOTE — Patient Instructions (Signed)

## 2021-12-07 NOTE — Progress Notes (Signed)
Comprehensive Evaluation & Examination  Future Appointments  Date Time Provider Department  12/08/2021        CPE  11:00 AM Unk Pinto, MD [facility]  02/25/2022         Wellness  9:30 AM Magda Bernheim, NP [facility]            This very nice 85 y.o. [name] presents for a comprehensive evaluation and management of multiple medical co-morbidities.  Patient has been followed for HTN, HLD, T2_NIDDM  Prediabetes and Vitamin D Deficiency. In  2021 , patient was seen by Dr Krista Blue & started on [place] for "mild cognitive impairment".  In 2000, patient had a Suprapubic Prostatectomy by Dr Risa Grill. In May 2022, he was dx'd with a RLE  DVT and prescribed Xarelto.          HTN predates since  2005 . Patient's BP has been controlled at home.  Today's BP is at goal -  119/66. Patient denies any cardiac symptoms as chest pain, palpitations, shortness of breath, dizziness or ankle swelling.       Patient's hyperlipidemia is controlled with diet and Atorvastatin .  Patient denies myalgias or other medication SE's. Last lipids were at goal :  Lab Results  Component Value Date   CHOL 163 09/08/2021   HDL 52 09/08/2021   LDLCALC 90 09/08/2021   TRIG 112 09/08/2021   CHOLHDL 3.1 09/08/2021         Patient has hx/o prediabetes  (A1c 5.7% /2013 & 5.3% /2014) and patient denies reactive hypoglycemic symptoms, visual blurring, diabetic polys or paresthesias. Last A1c was normal & at goal :   Lab Results  Component Value Date   HGBA1C 5.3 06/01/2021          Finally, patient has history of Vitamin D Deficiency ("41" /2013) and last vitamin D was at goal :   Lab Results  Component Value Date   VD25OH 59 06/01/2021     Current Outpatient Medications on File Prior to Visit  Medication Sig   VITAMIN C  Take  daily.   aspirin 81 MG tablet Take daily.   atorvastatin  40 MG tablet TAKE 1 TABLET DAILY    VITAMIN D  Take  daily.   CINNAMON 1,000 mg  Take daily.   Coenzyme Q10 200 MG TABS  Take 200 mg  daily.   donepezil  10 MG tablet Take at bedtime.   fish oil-omega-3  1000 MG  Take 2 g  daily.   FLAXSEED OIL 1,000 mg  Take daily.   Magnesium 250 MG TABS Take  daily.   Multiple Vitamins-Minerals  Take daily.   Multiple Vitamins-Minerals   ZINC  Take   rivaroxaban 20 MG TABS tablet Take  1 tablet  Daily     triamcinolone ointment 0.1 % Apply to Rash 2 to 3 x /day   vitamin C 500 MG tablet Take 500 mg by mouth daily.     No Known Allergies   Past Medical History:  Diagnosis Date   Cancer ([place])    prostate - basal cell   GERD (gastroesophageal reflux disease)    Hearing loss    wear hearing aid   Hyperlipidemia    Hypertension    Inguinal hernia    Memory loss    Prediabetes    Vitamin D deficiency    Wears glasses    Wears hearing aid    both ears     Health Maintenance  Topic Date Due   Zoster Vaccines- Shingrix (1 of 2) Never done   Pneumonia Vaccine 72+ Years old (2 - PPSV23 if available, else PCV20) 10/17/2015   COVID-19 Vaccine (3 - Pfizer risk series) 03/06/2020   INFLUENZA VACCINE  07/27/2021   TETANUS/TDAP  08/07/2028   HPV VACCINES  Aged Out     Immunization History  Administered Date(s) Administered   DT 09/03/2015   Fluad Quad(high Dose) 10/13/2019   Influenza Inj Mdck Quad Pf 11/10/2018   Influenza Split 10/28/2016   Influenza, High Dose  09/27/2014, 09/03/2015, 10/05/2017   Influenza,inj,Quad PF,6+ Mos 10/11/2018   PFIZER SARS-COV-2 Vacc 01/17/2020, 02/07/2020   Pneumococcal -13 10/16/2014   Pneumococcal-23 02/03/2011   Td 07/26/2005, 08/07/2018   Zoster, Live 12/27/2006    Last Colon - 04/2012 - Dr Arty Baumgartner - recc no f/u due to age  Past Surgical History:  Procedure Laterality Date   COLONOSCOPY     x2   INGUINAL HERNIA REPAIR  01/15/2013   Procedure: HERNIA REPAIR INGUINAL ADULT;  Surgeon: Gwenyth Ober, MD;  Location: [place] [facility];  Service: General;  Laterality: Right;   [place] or 2000    due to cancer   TONSILLECTOMY     as a child     Family History  Problem Relation Age of Onset   Other Mother        bowel infarction   COPD Father    Heart disease Father    Autism Son      Social History   Socioeconomic History   Marital status: Married      Spouse name: Vaughan Basta   Number of children: 2 sons and 1 with Autism who lives in a group home   Years of education: college  Occupational History   Occupation: Retired [id number] - [employer]    Tobacco Use   Smoking status: Former    Packs/day: 1.00    Years: 40.00    Pack years: 40.00    Types: Cigarettes    Quit date: 01/10/1999    Years since quitting: 22.9   Smokeless tobacco: Never  Substance Use Topics   Alcohol use: No    Comment: occasional   Drug use: No      ROS Constitutional: Denies fever, chills, weight loss/gain, headaches, insomnia,  night sweats or change in appetite. Does c/o fatigue. Eyes: Denies redness, blurred vision, diplopia, discharge, itchy or watery eyes.  ENT: Denies discharge, congestion, post nasal drip, epistaxis, sore throat, earache, hearing loss, dental pain, Tinnitus, Vertigo, Sinus pain or snoring.  Cardio: Denies chest pain, palpitations, irregular heartbeat, syncope, dyspnea, diaphoresis, orthopnea, PND, claudication or edema Respiratory: denies cough, dyspnea, DOE, pleurisy, hoarseness, laryngitis or wheezing.  Gastrointestinal: Denies dysphagia, heartburn, reflux, water brash, pain, cramps, nausea, vomiting, bloating, diarrhea, constipation, hematemesis, melena, hematochezia, jaundice or hemorrhoids Genitourinary: Denies dysuria, frequency, urgency, nocturia, hesitancy, discharge, hematuria or flank pain Musculoskeletal: Denies arthralgia, myalgia, stiffness, Jt. Swelling, pain, limp or strain/sprain. Denies Falls. Skin: Denies puritis, rash, hives, warts, acne, eczema or change in skin lesion Neuro: No weakness, tremor, incoordination, spasms,  paresthesia or pain Psychiatric: Denies confusion, memory loss or sensory loss. Denies Depression. Endocrine: Denies change in weight, skin, hair change, nocturia, and paresthesia, diabetic polys, visual blurring or hyper / hypo glycemic episodes.  Heme/Lymph: No excessive bleeding, bruising or enlarged lymph nodes.   Physical Exam  BP 119/66   Pulse 61   Temp 97.9 F (36.6 C)  Resp 16   Ht 5\' 10"  (1.778 m)   Wt 152 lb (68.9 kg)   SpO2 98%   BMI 21.81 kg/m   General Appearance: Well nourished and well groomed and in no apparent distress.  Eyes: PERRLA, EOMs, conjunctiva no swelling or erythema, normal fundi and vessels. Sinuses: No frontal/maxillary tenderness ENT/Mouth: EACs patent / TMs  nl. Nares clear without erythema, swelling, mucoid exudates. Oral hygiene is good. No erythema, swelling, or exudate. Tongue normal, non-obstructing. Tonsils not swollen or erythematous. Hearing normal.  Neck: Supple, thyroid not palpable. No bruits, nodes or JVD. Respiratory: Respiratory effort normal.  BS equal and clear bilateral without rales, rhonci, wheezing or stridor. Cardio: Heart sounds are normal with regular rate and rhythm and no murmurs, rubs or gallops. Peripheral pulses are normal and equal bilaterally without edema. No aortic or femoral bruits. Chest: symmetric with normal excursions and percussion.  Abdomen: Soft, with Nl bowel sounds. Nontender, no guarding, rebound, hernias, masses, or organomegaly.  Lymphatics: Non tender without lymphadenopathy.  Musculoskeletal: Full ROM all peripheral extremities, joint stability, 5/5 strength, and normal gait. Skin: Warm and dry without rashes, lesions, cyanosis, clubbing or  ecchymosis.  Neuro: Cranial nerves intact, reflexes equal bilaterally. Normal muscle tone, no cerebellar symptoms. Sensation intact.  Pysch: Alert and oriented x 3 with normal affect, insight and judgment appropriate.   Assessment and Plan   1. Labile  hypertension  - EKG 12-Lead - Urinalysis, Routine w reflex microscopic - Microalbumin / creatinine urine ratio - CBC with Differential/Platelet - COMPLETE METABOLIC PANEL WITH GFR - Magnesium - TSH  2. Hyperlipidemia, mixed  - EKG 12-Lead - Lipid panel - TSH  3. Abnormal glucose  - EKG 12-Lead - Hemoglobin A1c - Insulin, random  4. Vitamin D deficiency  - VITAMIN D 25 Hydroxy (  5. Vascular dementia without behavioral disturbance (HCC)  - Lipid panel  6. Acute deep vein thrombosis (DVT) of iliac vein of right lower extremity (HCC)  - now completed 6 + months on Xarelto - discussed taper off over the next month.   7. History of prostate cancer  - PSA  8. BPH with obstruction/lower urinary tract symptoms  - PSA  9. Screening for colorectal cancer  - POC Hemoccult Bld/Stl   10. Screening for ischemic heart disease  - EKG 12-Lead  11. FHx: heart disease  - EKG 12-Lead  12. Former smoker  - EKG 12-Lead  13. Screening for AAA (aortic abdominal aneurysm)   14. Medication management  - Urinalysis, Routine w reflex microscopic - Microalbumin / creatinine urine ratio - CBC with Differential/Platelet - COMPLETE METABOLIC PANEL WITH GFR - Magnesium - Lipid panel - TSH - Hemoglobin A1c - Insulin, random - VITAMIN D 25 Hydroxy          Patient was counseled in prudent diet, weight control to achieve/maintain BMI less than 25, BP monitoring, regular exercise and medications as discussed.  Discussed med effects and SE's. Routine screening labs and tests as requested with regular follow-up as recommended. Over 40 minutes of exam, counseling, chart review and high complex critical decision making was performed   Kirtland Bouchard, MD

## 2021-12-08 ENCOUNTER — Encounter: Payer: Self-pay | Admitting: Internal Medicine

## 2021-12-08 ENCOUNTER — Other Ambulatory Visit: Payer: Self-pay

## 2021-12-08 ENCOUNTER — Ambulatory Visit: Payer: Medicare Other | Admitting: Internal Medicine

## 2021-12-08 VITALS — BP 119/66 | HR 61 | Temp 97.9°F | Resp 16 | Ht 70.0 in | Wt 152.0 lb

## 2021-12-08 DIAGNOSIS — Z8249 Family history of ischemic heart disease and other diseases of the circulatory system: Secondary | ICD-10-CM

## 2021-12-08 DIAGNOSIS — F0150 Vascular dementia without behavioral disturbance: Secondary | ICD-10-CM

## 2021-12-08 DIAGNOSIS — E782 Mixed hyperlipidemia: Secondary | ICD-10-CM | POA: Diagnosis not present

## 2021-12-08 DIAGNOSIS — Z79899 Other long term (current) drug therapy: Secondary | ICD-10-CM

## 2021-12-08 DIAGNOSIS — I82421 Acute embolism and thrombosis of right iliac vein: Secondary | ICD-10-CM

## 2021-12-08 DIAGNOSIS — Z136 Encounter for screening for cardiovascular disorders: Secondary | ICD-10-CM

## 2021-12-08 DIAGNOSIS — R0989 Other specified symptoms and signs involving the circulatory and respiratory systems: Secondary | ICD-10-CM | POA: Diagnosis not present

## 2021-12-08 DIAGNOSIS — Z1211 Encounter for screening for malignant neoplasm of colon: Secondary | ICD-10-CM

## 2021-12-08 DIAGNOSIS — R7309 Other abnormal glucose: Secondary | ICD-10-CM | POA: Diagnosis not present

## 2021-12-08 DIAGNOSIS — E559 Vitamin D deficiency, unspecified: Secondary | ICD-10-CM

## 2021-12-08 DIAGNOSIS — I10 Essential (primary) hypertension: Secondary | ICD-10-CM

## 2021-12-08 DIAGNOSIS — N138 Other obstructive and reflux uropathy: Secondary | ICD-10-CM | POA: Diagnosis not present

## 2021-12-08 DIAGNOSIS — N401 Enlarged prostate with lower urinary tract symptoms: Secondary | ICD-10-CM | POA: Diagnosis not present

## 2021-12-08 DIAGNOSIS — Z8546 Personal history of malignant neoplasm of prostate: Secondary | ICD-10-CM | POA: Diagnosis not present

## 2021-12-08 DIAGNOSIS — Z87891 Personal history of nicotine dependence: Secondary | ICD-10-CM

## 2021-12-09 LAB — COMPLETE METABOLIC PANEL WITH GFR
AG Ratio: 1.6 (calc) (ref 1.0–2.5)
ALT: 22 U/L (ref 9–46)
AST: 26 U/L (ref 10–35)
Albumin: 3.9 g/dL (ref 3.6–5.1)
Alkaline phosphatase (APISO): 76 U/L (ref 35–144)
BUN: 19 mg/dL (ref 7–25)
CO2: 28 mmol/L (ref 20–32)
Calcium: 9.3 mg/dL (ref 8.6–10.3)
Chloride: 106 mmol/L (ref 98–110)
Creat: 1.08 mg/dL (ref 0.70–1.22)
Globulin: 2.4 g/dL (calc) (ref 1.9–3.7)
Glucose, Bld: 120 mg/dL — ABNORMAL HIGH (ref 65–99)
Potassium: 4.4 mmol/L (ref 3.5–5.3)
Sodium: 142 mmol/L (ref 135–146)
Total Bilirubin: 0.6 mg/dL (ref 0.2–1.2)
Total Protein: 6.3 g/dL (ref 6.1–8.1)
eGFR: 68 mL/min/{1.73_m2} (ref >=60)

## 2021-12-09 LAB — MICROSCOPIC MESSAGE

## 2021-12-09 LAB — LIPID PANEL
Cholesterol: 130 mg/dL (ref <200)
HDL: 43 mg/dL (ref >=40)
LDL Cholesterol (Calc): 67 mg/dL (calc)
Non-HDL Cholesterol (Calc): 87 mg/dL (calc) (ref <130)
Total CHOL/HDL Ratio: 3 (calc) (ref <5.0)
Triglycerides: 113 mg/dL (ref <150)

## 2021-12-09 LAB — MICROALBUMIN / CREATININE URINE RATIO
Creatinine, Urine: 111 mg/dL (ref 20–320)
Microalb Creat Ratio: 41 mcg/mg creat — ABNORMAL HIGH (ref <30)
Microalb, Ur: 4.6 mg/dL

## 2021-12-09 LAB — INSULIN, RANDOM: Insulin: 115.3 u[IU]/mL — ABNORMAL HIGH

## 2021-12-09 LAB — HEMOGLOBIN A1C
Hgb A1c MFr Bld: 5.2 % of total Hgb (ref <5.7)
Mean Plasma Glucose: 103 mg/dL
eAG (mmol/L): 5.7 mmol/L

## 2021-12-09 LAB — CBC WITH DIFFERENTIAL/PLATELET
Absolute Monocytes: 576 cells/uL (ref 200–950)
Basophils Absolute: 67 cells/uL (ref 0–200)
Basophils Relative: 0.7 %
Eosinophils Absolute: 221 cells/uL (ref 15–500)
Eosinophils Relative: 2.3 %
HCT: 46.8 % (ref 38.5–50.0)
Hemoglobin: 15.3 g/dL (ref 13.2–17.1)
Lymphs Abs: 1459 cells/uL (ref 850–3900)
MCH: 30.6 pg (ref 27.0–33.0)
MCHC: 32.7 g/dL (ref 32.0–36.0)
MCV: 93.6 fL (ref 80.0–100.0)
MPV: 11.3 fL (ref 7.5–12.5)
Monocytes Relative: 6 %
Neutro Abs: 7277 cells/uL (ref 1500–7800)
Neutrophils Relative %: 75.8 %
Platelets: 256 10*3/uL (ref 140–400)
RBC: 5 10*6/uL (ref 4.20–5.80)
RDW: 11.8 % (ref 11.0–15.0)
Total Lymphocyte: 15.2 %
WBC: 9.6 10*3/uL (ref 3.8–10.8)

## 2021-12-09 LAB — PSA: PSA: 0.04 ng/mL (ref <=4.00)

## 2021-12-09 LAB — URINALYSIS, ROUTINE W REFLEX MICROSCOPIC
Bacteria, UA: NONE SEEN /HPF
Bilirubin Urine: NEGATIVE
Glucose, UA: NEGATIVE
Hgb urine dipstick: NEGATIVE
Hyaline Cast: NONE SEEN /LPF
Ketones, ur: NEGATIVE
Leukocytes,Ua: NEGATIVE
Nitrite: NEGATIVE
RBC / HPF: NONE SEEN /HPF (ref 0–2)
Specific Gravity, Urine: 1.017 (ref 1.001–1.035)
Squamous Epithelial / HPF: NONE SEEN /HPF (ref <=5)
WBC, UA: NONE SEEN /HPF (ref 0–5)
pH: 5.5 (ref 5.0–8.0)

## 2021-12-09 LAB — MAGNESIUM: Magnesium: 2.2 mg/dL (ref 1.5–2.5)

## 2021-12-09 LAB — VITAMIN D 25 HYDROXY (VIT D DEFICIENCY, FRACTURES): Vit D, 25-Hydroxy: 70 ng/mL (ref 30–100)

## 2021-12-09 LAB — TSH: TSH: 3.13 mIU/L (ref 0.40–4.50)

## 2021-12-09 NOTE — Progress Notes (Signed)
============================================================ °-   Test results slightly outside the reference range are not unusual. If there is anything important, I will review this with you,  otherwise it is considered normal test values.  If you have further questions,  please do not hesitate to contact me at the office or via My Chart.  ============================================================ ============================================================  -  Total Chol  130        &       LDL Chol = 67        -       Both  Excellent   - Very low risk for Heart Attack  / Stroke ============================================================ ============================================================  -  A1c - Normal - No Diabetes  - Great   ! ============================================================ ============================================================  -  Vitamin D = 70 -- Excellent   ! ============================================================ ============================================================  -  PSA - very Low - essentially undetectable  -  Great  ! ============================================================ ============================================================  -  All Else - CBC - Kidneys - Electrolytes - Liver - Magnesium & Thyroid    - all  Normal / OK ============================================================ ============================================================  -  Keep up the Work  !  ============================================================ ============================================================

## 2022-01-04 ENCOUNTER — Other Ambulatory Visit: Payer: Self-pay

## 2022-01-04 ENCOUNTER — Telehealth: Payer: Self-pay | Admitting: Internal Medicine

## 2022-01-04 DIAGNOSIS — Z1211 Encounter for screening for malignant neoplasm of colon: Secondary | ICD-10-CM

## 2022-01-04 DIAGNOSIS — Z1212 Encounter for screening for malignant neoplasm of rectum: Secondary | ICD-10-CM | POA: Diagnosis not present

## 2022-01-04 LAB — POC HEMOCCULT BLD/STL (HOME/3-CARD/SCREEN)
Card #2 Fecal Occult Blod, POC: NEGATIVE
Card #3 Fecal Occult Blood, POC: NEGATIVE
Fecal Occult Blood, POC: NEGATIVE

## 2022-01-04 NOTE — Progress Notes (Signed)
°  Chronic Care Management   Note  01/04/2022 Name: NORRIS BRUMBACH MRN: [id number] DOB: Sep 23, 1936  LUCCIANO VITALI is a 86 y.o. year old male who is a primary care patient of Unk Pinto, MD. I reached out to Marsh Dolly by phone today in response to a referral sent by Mr. Governor Rooks PCP, Unk Pinto, MD.   Mr. Buys was given information about Chronic Care Management services today including:  CCM service includes personalized support from designated clinical staff supervised by his physician, including individualized plan of care and coordination with other care providers 24/7 contact phone numbers for assistance for urgent and routine care needs. Service will only be billed when office clinical staff spend 20 minutes or more in a month to coordinate care. Only one practitioner may furnish and bill the service in a calendar month. The patient may stop CCM services at any time (effective at the end of the month) by phone call to the office staff.   LINDSA Hyneman/SPOUSE verbally agreed to assistance and services provided by embedded care coordination/care management team today.  Follow up plan:   Tatjana Secretary/administrator

## 2022-01-22 ENCOUNTER — Telehealth: Payer: Self-pay

## 2022-01-22 NOTE — Telephone Encounter (Signed)
Called patient to confirm visit on 1/30 at 9:00am w/ CPP. Unable to reach patient. [place].  Total time spent: 2 minutes Vanetta Shawl, [facility]

## 2022-01-25 ENCOUNTER — Other Ambulatory Visit: Payer: Self-pay

## 2022-01-25 ENCOUNTER — Other Ambulatory Visit: Payer: Self-pay | Admitting: Nurse Practitioner

## 2022-01-25 ENCOUNTER — Ambulatory Visit: Payer: Medicare Other | Admitting: Pharmacist

## 2022-01-25 VITALS — BP 131/72 | HR 69

## 2022-01-25 DIAGNOSIS — E559 Vitamin D deficiency, unspecified: Secondary | ICD-10-CM | POA: Diagnosis not present

## 2022-01-25 DIAGNOSIS — E782 Mixed hyperlipidemia: Secondary | ICD-10-CM | POA: Diagnosis not present

## 2022-01-25 DIAGNOSIS — I10 Essential (primary) hypertension: Secondary | ICD-10-CM | POA: Diagnosis not present

## 2022-01-25 DIAGNOSIS — Z79899 Other long term (current) drug therapy: Secondary | ICD-10-CM

## 2022-01-25 DIAGNOSIS — R7309 Other abnormal glucose: Secondary | ICD-10-CM

## 2022-01-25 DIAGNOSIS — K219 Gastro-esophageal reflux disease without esophagitis: Secondary | ICD-10-CM

## 2022-01-25 DIAGNOSIS — J449 Chronic obstructive pulmonary disease, unspecified: Secondary | ICD-10-CM

## 2022-01-25 MED ORDER — ATORVASTATIN CALCIUM 40 MG PO TABS: ORAL_TABLET | ORAL | 0 refills | Status: DC

## 2022-01-26 NOTE — Progress Notes (Signed)
Pharmacist Visit  RAMBO, SARAFIAN  [id number] 86 years, Male  DOB: 07/10/1936  M: [phone] Care Team: Rhys Martini, Heston Widener  __________________________________________________ Clinical Summary Dustyn Dansereau is a friendly 86 year old male who presents for CCM initial visit. Pt is accompanied with wife, Tavaras Goody. He has no concerns at this time. He has successfully discontinued Xarelto for treatment of a DVT Background:: Noah Lembke has a past medical history of HTN, COPD, GERD, HLD, Dementia, Vitamin D deficiency, Pre-diabetes, and History of basal cell carcinoma and prostate cancer. Patient loves to read and walk for 1 hour daily Assessment:: Patient chronic conditions are currently well controlled. Pt is seen by neurologist for dementia and is well controlled on current medications Recommendations:: Encouraged patient to continue to practice healthy lifestyle choices to maintain control of blood sugar, blood pressure and cholesterol. Encouraged patient to obtain Shingrix and Pneumonia vaccination. Pt obtained flu shot an COVID booster at [facility]. [facility] will fax updated vaccination records to office. Will reach out to provider to update Atorvastatin Rx to reflect current dosing regimen: Atorvastatin 40mg  every other day for cholesterol Recommend referral for Spirometry testing to confirm diagnosis of COPD Attestation Statement:: CCM Services:  This encounter meets complex CCM services and moderate to high medical decision making.  Prior to outreach and patient consent for Chronic Care Management, I referred this patient for services after reviewing the nominated patient list or from a personal encounter with the patient.  I have personally reviewed this encounter including the documentation in this note and have collaborated with the care management provider regarding care management and care coordination activities to include development and update of the comprehensive  care plan I am certifying that I agree with the content of this note and encounter as supervising physician.  Chronic Conditions Patient's Chronic Conditions: Hypertension (HTN), Chronic Obstructive Pulmonary Disease (COPD), Gastroesophageal Reflux Disease (GERD), Hyperlipidemia/Dyslipidemia (HLD), Dementia, Diabetes (DM), Vitamin D deficiency, Pre-diabetes, History of basal cell carcinoma and prostate cancer  Disease Assessments  Subjective Information Current BP: 119/66 Current HR: 61 taken on: 12/08/2021 Previous BP: 114/67 Previous HR: 57 taken on: 09/14/2021 Weight: 152 BMI: 21.81 Last GFR: 68 taken on: 12/08/2021 Why did the patient present?: CCM Initial Visit Marital status?: Married Details: Sherri Levenhagen 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 Who does the patient spend their time with and what do they do?: Domingo Mend, wife Lifestyle habits such as diet and exercise?: Walk an hour every day if not raining Diet: cereal, sandwich soup, whatever wife fixes Water: not a great amount Coffee: 2 cups Alcohol, tobacco, and illicit drug usage?: None What is the patient's sleep pattern?: No sleep issues How many hours per night does patient typically sleep?: 9 hours Patient pleased with health care they are receiving?: Yes Family, occupational, and living circumstances relevant to overall health?: History of bowel infarction in mother and history of COPD and Heart disease in Father Name and location of [facility] Current Rx insurance plan: Other Details: [employer] Are meds synced by current pharmacy?: No Are meds delivered by current pharmacy?: No - delivery available but patient prefers to not use Would patient benefit from direct intervention of clinical lead in dispensing process to optimize clinical outcomes?: Yes Are [facility] pharmacy services available where patient lives?: Yes Is  patient disadvantaged to use [facility]?: No [facility] reviewed with patient and patient wishes to change pharmacy?: No Select  reason patient declined to change pharmacies: Other Details: Wife loves to walk to pharmacy and pick up meds. [facility]. Does patient experience delays in picking up medications due to transportation concerns (getting to pharmacy)?: No Any additional demeanor/mood notes?: Jacobo Moncrief is a pleasant 86 year old male who presents for initial CCM visit. No chief complaints today. He has a little difficulty hearing and uses a hearing aid during the visit along with his wife's assistance to relay questions  Hypertension (HTN) Assess this condition today?: Yes Is patient able to obtain BP reading today?: Yes BP today is: 131/72 Heart Rate is: 69 Goal: <130/80 mmHG Hypertension Stage: Stage 1 (SBP: 130-139 or DBP: 80-89) Is Patient checking BP at home?: Yes Pt. home BP readings are ranging: Pt does not check regularly How often does patient miss taking their blood pressure medications?: No BP meds Has patient experienced hypotension, dizziness, falls or bradycardia?: No BP RPM device: Does patient qualify?: No We discussed: Targeting 150 minutes of aerobic activity per week, Reducing the amount of salt intake to 1500mg /per day., DASH diet:  following a diet emphasizing fruits and vegetables and low-fat dairy products along with whole grains, fish, poultry, and nuts. Reducing red meats and sugars. Assessment:: Controlled Drug: None HC Follow up: N/A Pharmacist Follow up: Congratulated patient on blood pressure control. Continue to monitor periodically and if symptomatic.  Hyperlipidemia/Dyslipidemia (HLD) Last Lipid panel on: 12/08/2021 TC (Goal<200): 130 LDL: 67 HDL (Goal>40): 43 TG (Goal<150): 113 ASCVD 10-year risk?is:: N/A due to Age > 84 Assess this condition today?: Yes LDL Goal: <70 Has patient tried  and failed any HLD Medications?: No We discussed: How to reduce cholesterol through diet/weight management and physical activity., How a diet high in plant sterols (fruits/vegetables/nuts/whole grains/legumes) may reduce your cholesterol., Encouraged increasing fiber to a daily intake of 10-25g/day, Other (provide details below) Details: Discussed adherence with patient. Will ensure patient has updated prescription to reflect how patient is taking atorvastatin Assessment:: Controlled Drug: Atorvastatin 40mg  QOD Assessment: Appropriate, Effective, Safe, Accessible HC Follow up: N/A Pharmacist Follow up: Assess lipid panel, LFTs, s/s rhabdo  Diabetes (DM) Current A1C: 5.2 taken on: 12/08/2021 Previous A1C: 5.3 taken on: 05/31/2021 Type: Pre-Diabetes/Impaired Fasting Glucose Assess this condition today?: Yes Goal A1C: Other Details: 5.7% Type: Pre-Diabetes/Impaired Fasting Glucose We discussed: Modifying lifestyle, including to participate in moderate physical activity (e.g., walking) at least 150 minutes per week., Diet and exercise extensively Assessment:: Controlled Drug: None HC Follow up: N/A Pharmacist Follow up: Assess A1c and FBG  Chronic Obstructive Pulmonary Disease (COPD) Current Eosinophils: 221 taken on: 12/08/2021 CAT Score: Unknown Assess this condition today?: Yes Gold group: A (low sx, < 2 exacerbations / yr) Exacerbations in past year without hospitalization: No Is patient currently Smoking or Vaping?: No Did patient smoke/vape before?: Yes Details on prior smoking/vaping history: 40 pack year hx of smoking Home oxygen therapy: No Frequency of SABA/SAMA use: Never Influenza vaccine: Yes Prevnar vaccine: Yes Pneumovax vaccine: No We counseled the patient on:: Other Details: Discussed need for pneumonia vaccine and importance in decreasing risk of exacerbations Assessment:: Controlled Drug: None HC Follow up: N/A Pharmacist Follow up: Recommend a referral for  spirometry testing to confirm diagnosis of COPD in patient. Recommend patient receive PCV 20 or PPSV23  GERD Assess this condition today?: Yes How frequently do you have symptoms of GERD or reflux?: never Is the patient a smoker or exposed to second-hand smoke?: No Assessment:: Controlled Drug: None HC Follow  up: N/A Pharmacist Follow up: Assess GERD symptoms at next visit. Pt well controlled on no meds at this time  Vitamin D Deficiency  Assess this condition today?: Yes What condition are we assessing today?: Vitamin D Deficiency  Pertinent Labs: Vit D 25OH (12/08/2022): 70 Assessment:: Controlled Drug: Vitamin D 1000 IU, take 2 tablets daily Assessment: Appropriate, Effective, Safe, Accessible Additional Info: Congratulated patient on vitamin D level, continue with cupplementation HC Follow up: N/A Pharmacist Follow up: Assess vitamin d level  Exercise, Diet and Non-Drug Coordination Needs Additional exercise counseling points. We discussed: targeting at least 150 minutes per week of moderate-intensity aerobic exercise., incorporating flexibility, balance, and strength training exercises Additional diet counseling points. We discussed: key components of the DASH diet, aiming to consume at least 8 cups of water day Discussed Non-Drug Care Coordination Needs: Yes Does Patient have Medication financial barriers?: No  Accountable Health Communities Health-Related Social Needs Screening Tool -  SDOH  ([email or web address]) What is your living situation today? (ref #1): I have a steady place to live Think about the place you live. Do you have problems with any of the following? (ref #2): None of the above Within the past 12 months, you worried that your food would run out before you got money to buy more (ref #3): Never true Within the past 12 months, the food you bought just didn't last and you didn't have money to get more (ref #4):  Never true In the past 12 months, has lack of reliable transportation kept you from medical appointments, meetings, work or from getting things needed for daily living? (ref #5): No In the past 12 months, has the electric, gas, oil, or water company threatened to shut off services in your home? (ref #6): No How often does anyone, including family and friends, physically hurt you? (ref #7): Never (1) How often does anyone, including family and friends, insult or talk down to you? (ref #8): Never (1) How often does anyone, including friends and family, threaten you with harm? (ref #9): Never (1) How often does anyone, including family and friends, scream or curse at you? (ref #10): Never (1)  Engagement Notes Newton Pigg on 01/25/2022 12:33 PM CPP Chart Review: 30 min CPP Office Visit: 33 min CPP Office Visit Documentation: 40 min CPP Coordination of Care: 5 min [facility] Care Plan Completion: CPP Care Plan Review: 22 min   Engagement Notes Newton Pigg on 01/25/2022 01:02 PM CPP F/u: 03/16/22: [place] w [place] 06/22/22: OV w [place] 07/13/22: 11:15am CCM phone call follow-up: Assess BP, LDL, BG  Care Gaps: Zoster Vaccine - Pt will complete at [place] completed will bring card at next visit Flu Vaccine - Pt completed, [facility] will [facility] to office Pneumonia Vaccine - Pt will complete at [place]. Jeannett Senior, PharmD  Clinical Pharmacist  [email or web address]  [phone]

## 2022-02-22 DIAGNOSIS — C44629 Squamous cell carcinoma of skin of left upper limb, including shoulder: Secondary | ICD-10-CM | POA: Diagnosis not present

## 2022-02-22 DIAGNOSIS — C44229 Squamous cell carcinoma of skin of left ear and external auricular canal: Secondary | ICD-10-CM | POA: Diagnosis not present

## 2022-02-22 DIAGNOSIS — D225 Melanocytic nevi of trunk: Secondary | ICD-10-CM | POA: Diagnosis not present

## 2022-02-22 DIAGNOSIS — L570 Actinic keratosis: Secondary | ICD-10-CM | POA: Diagnosis not present

## 2022-02-22 DIAGNOSIS — L814 Other melanin hyperpigmentation: Secondary | ICD-10-CM | POA: Diagnosis not present

## 2022-02-22 DIAGNOSIS — Z08 Encounter for follow-up examination after completed treatment for malignant neoplasm: Secondary | ICD-10-CM | POA: Diagnosis not present

## 2022-02-22 DIAGNOSIS — D492 Neoplasm of unspecified behavior of bone, soft tissue, and skin: Secondary | ICD-10-CM | POA: Diagnosis not present

## 2022-02-22 DIAGNOSIS — L821 Other seborrheic keratosis: Secondary | ICD-10-CM | POA: Diagnosis not present

## 2022-02-22 DIAGNOSIS — Z85828 Personal history of other malignant neoplasm of skin: Secondary | ICD-10-CM | POA: Diagnosis not present

## 2022-02-25 ENCOUNTER — Ambulatory Visit: Payer: Medicare Other | Admitting: Nurse Practitioner

## 2022-03-15 NOTE — Progress Notes (Signed)
MEDICARE ANNUAL WELLNESS VISIT AND FOLLOW UP ? ?Assessment:  ? ?Diagnoses and all orders for this visit: ? ?Encounter for Medicare annual wellness exam ?Yearly ? ?Essential hypertension ?At goal off of medications ?Monitor blood pressure at home; call if consistently over 130/80 ?Continue DASH diet.   ?Reminder to go to the ER if any CP, SOB, nausea, dizziness, severe HA, changes vision/speech, left arm numbness and tingling and jaw pain. ?- CBC ? ?Gastroesophageal reflux disease, esophagitis presence not specified ?Currently well controlled with lifestyle changes - avoiding triggers, excessively large meals, lying down after eating ?OTC agents for rare symptoms; contact office if frequency increases ? ?History of prostate cancer ?S/p prostatectomy 1999; no issues or suspected recurrence.  ?Wears pad for incontinence and doing well Continue to monitor.  ? ?Mixed hyperlipidemia ?Continue medications and supplements for LDL goal <100 ?Continue low cholesterol diet and exercise.  ?Check lipid panel.  ?- CMP ? ?Abnormal glucose ?Previously prediabetic currently well controlled with lifestyle changes ?Encouraged to continue with lifestyle ?Monitor A1C ? ?Vitamin D deficiency ?Continue supplementation and routine monitoring ?At goal at recent check; continue to recommend supplementation for goal of 60-100 ?Defer vitamin D level ? ?History of DVT ?Currently on Xarelto ? ?History of basal cell carcinoma ?Continue follow up with derm q6 months or as recommended; wear sunscreen when outdoors and reapply regularly. ? ?Body mass index (BMI) of 22.0-22.9 in adult ?Rec. Maintain weight with balanced diet and exercise ? ?Former smoker/40+ pack year hx ?No sx, last cxr 2019, no sx, declines imaging at this time ? ?Memory changes ?Had normal basic labs, smoker 40+ pack year, had MRI suggestive of microvascular changes ?Continue on Aricept and follow with [facility]ology at [facility] ?Continue ASA, statin   ? ?Pneumococcal vaccine  need ?-Pneumococcal 23 given IM ? ?Hard of hearing ?Continue hearing aids ? ? ?Will call with lab results: patient / wife ? ?Over 30 minutes of face to face interview, exam, counseling, chart review, and critical decision making was performed ? ?Future Appointments  ?Date Time Provider [place]  ?06/22/2022 10:30 AM Unk Pinto, MD [facility] None  ?07/13/2022 11:15 AM Newton Pigg, RPH [facility] None  ?12/15/2022 11:00 AM Unk Pinto, MD [facility] None  ?03/17/2023 11:00 AM Magda Bernheim, NP [facility] None  ? ? ? ?Plan:  ? ?During the course of the visit the patient was educated and counseled about appropriate screening and preventive services including:  ? ?Pneumococcal vaccine  ?Influenza vaccine ?Prevnar 13 ?Td vaccine ?Screening electrocardiogram ?Colorectal cancer screening ?Diabetes screening ?Glaucoma screening ?Nutrition counseling  ? ? ?Subjective:  ?Brian Proctor is a 86 y.o. male who presents for Medicare Annual Wellness Visit and 3 month follow up for HTN, HLD, history prediabetes, and vitamin D Def.  ? ?S/p prostatectomy for CA in 1999 - has incontinence for which he wears a pad.  ? ?He has hx of basal cell skin CA for which he sees dermatology q5mfor monitoring.  ? ?He had right lower leg DVT 05/05/21. He believes he is still on Xarelto. His wife lays out his medicine and is uncertain of his particular medications ? ?He is followed by Dr. YKrista Bluefor mild cognitive impairment and is on Donepezil 10 mg daily. Deferred MMSE as he is also followed by gerontology Dr. ALeamon Arntand was evaluated 08/2021 ? ?GERD well controlled by lifestyle modification; uses alka-seltzer for rare symptoms. He does endorse increased belching but denies other symptoms. He feels well controlled with current regimen.  ? ?He  has reported ongoing gradual short term memory loss, had MRI in 06/2019 for these reports which showed microvascular changes. Declined namenda/aricept at that time, had basic labs  at that time including b12 which were normal, today repots interest in seeing a neurologist to discuss further.  ? ?BMI is Body mass index is 21.55 kg/m?., he has been working on diet and exercise. His various medical conditions are very well controlled - most managed by lifestyle modification. He continues to walk 60 min 3-4 times a week without issues, golfs and does regular yard work.  ?Wt Readings from Last 3 Encounters:  ?03/16/22 150 lb 3.2 oz (68.1 kg)  ?12/08/21 152 lb (68.9 kg)  ?09/08/21 141 lb 6.4 oz (64.1 kg)  ? ?His blood pressure has been controlled at home, today their BP is BP: 108/62  ?BP Readings from Last 3 Encounters:  ?03/16/22 108/62  ?01/25/22 131/72  ?12/08/21 119/66  ? ? ?He does workout. He denies chest pain, shortness of breath, dizziness.  ? ?He is on cholesterol medication (atorvastatatin 40 mg daily), on omega 3 supplement and lifestyle changes. His cholesterol is at goal. The cholesterol last visit was:  ?Lab Results  ?Component Value Date  ? CHOL 130 12/08/2021  ? HDL 43 12/08/2021  ? Alva 67 12/08/2021  ? TRIG 113 12/08/2021  ? CHOLHDL 3.0 12/08/2021  ? ?He has been working on diet and exercise for history of prediabetes, and denies increased appetite, nausea, paresthesia of the feet, polydipsia, polyuria and visual disturbances. Last A1C in the office was:  ?Lab Results  ?Component Value Date  ? HGBA1C 5.2 12/08/2021  ? ?Last GFR ?Lab Results  ?Component Value Date  ? GFRNONAA 58 (L) 06/01/2021  ? ? Patient is on Vitamin D supplement.   ?Lab Results  ?Component Value Date  ? VD25OH 70 12/08/2021  ?   ? ?Medication Review: ?Current Outpatient Medications on File Prior to Visit  ?Medication Sig Dispense Refill  ? aspirin 81 MG tablet Take 81 mg by mouth daily.    ? atorvastatin (LIPITOR) 40 MG tablet TAKE 1 TABLET BY MOUTH EVERY OTHER DAY FOR CHOLESTEROL 90 tablet 0  ? cholecalciferol (VITAMIN D3) 25 MCG (1000 UNIT) tablet Take 1,000 Units by mouth in the morning and at bedtime.     ? CINNAMON PO Take 2,000 mg by mouth daily.    ? Coenzyme Q10 200 MG TABS Take 200 mg by mouth daily.    ? donepezil (ARICEPT) 10 MG tablet Take 10 mg by mouth at bedtime.    ? fish oil-omega-3 fatty acids 1000 MG capsule Take 1,200 mg by mouth daily.    ? Magnesium 250 MG TABS Take 500 mg by mouth daily.    ? Multiple Vitamins-Minerals (MULTIVITAMIN PO) Take by mouth daily.    ? rivaroxaban (XARELTO) 20 MG TABS tablet Take  1 tablet  Daily  to Prevent Blood Clots                                        /                              TAKE 1 TABLET(20 MG) BY MOUTH 90 tablet 3  ? triamcinolone ointment (KENALOG) 0.1 % Apply 1 application topically 2 (two) times daily. Apply to Rash 2 to 3 x /day 80  g 3  ? vitamin C (ASCORBIC ACID) 500 MG tablet Take 1,000 mg by mouth daily.    ? zinc gluconate 50 MG tablet Take 50 mg by mouth daily.    ? pseudoephedrine (SUDAFED) 30 MG tablet Take 30 mg by mouth daily. (Patient not taking: Reported on 03/16/2022)    ? ?No current facility-administered medications on file prior to visit.  ? ? ?Allergies: ?No Known Allergies ? ?Current Problems (verified) ?has Hyperlipidemia, mixed; Essential hypertension; GERD (gastroesophageal reflux disease); Vitamin D deficiency; Medication management; History of prostate cancer; Encounter for Medicare annual wellness exam; BMI 22.0-22.9, adult; History of basal cell carcinoma; Former smoker; FH: heart disease; COPD (chronic obstructive pulmonary disease) ([place]); Abnormal glucose; and Vascular dementia without behavioral disturbance ([place]) on their problem list. ? ?Screening Tests ?Immunization History  ?Administered Date(s) Administered  ? DT (Pediatric) 09/03/2015  ? Fluad Quad(high Dose 65+) 10/13/2019  ? Influenza Inj Mdck Quad Pf 11/10/2018  ? Influenza Split 10/28/2016  ? Influenza, High Dose Seasonal PF 09/27/2014, 09/03/2015, 10/05/2017, 10/07/2020, 10/21/2021  ? Influenza,inj,Quad PF,6+ Mos 10/11/2018  ? PFIZER Comirnaty(Gray Top)Covid-19  Tri-Sucrose Vaccine 05/22/2021  ? PFIZER(Purple Top)SARS-COV-2 Vaccination 01/17/2020, 02/07/2020, 09/23/2020  ? Pension scheme manager 64yr & up 10/21/2021  ? Pneumococcal Conjugate-13 10/21/2

## 2022-03-16 ENCOUNTER — Encounter: Payer: Self-pay | Admitting: Nurse Practitioner

## 2022-03-16 ENCOUNTER — Ambulatory Visit (INDEPENDENT_AMBULATORY_CARE_PROVIDER_SITE_OTHER): Payer: Medicare Other | Admitting: Nurse Practitioner

## 2022-03-16 ENCOUNTER — Other Ambulatory Visit: Payer: Self-pay

## 2022-03-16 VITALS — BP 108/62 | HR 71 | Temp 97.7°F | Wt 150.2 lb

## 2022-03-16 DIAGNOSIS — Z23 Encounter for immunization: Secondary | ICD-10-CM | POA: Diagnosis not present

## 2022-03-16 DIAGNOSIS — E782 Mixed hyperlipidemia: Secondary | ICD-10-CM | POA: Diagnosis not present

## 2022-03-16 DIAGNOSIS — Z8546 Personal history of malignant neoplasm of prostate: Secondary | ICD-10-CM

## 2022-03-16 DIAGNOSIS — H9190 Unspecified hearing loss, unspecified ear: Secondary | ICD-10-CM

## 2022-03-16 DIAGNOSIS — Z79899 Other long term (current) drug therapy: Secondary | ICD-10-CM

## 2022-03-16 DIAGNOSIS — Z6822 Body mass index (BMI) 22.0-22.9, adult: Secondary | ICD-10-CM

## 2022-03-16 DIAGNOSIS — R6889 Other general symptoms and signs: Secondary | ICD-10-CM | POA: Diagnosis not present

## 2022-03-16 DIAGNOSIS — K219 Gastro-esophageal reflux disease without esophagitis: Secondary | ICD-10-CM

## 2022-03-16 DIAGNOSIS — Z86718 Personal history of other venous thrombosis and embolism: Secondary | ICD-10-CM | POA: Diagnosis not present

## 2022-03-16 DIAGNOSIS — R413 Other amnesia: Secondary | ICD-10-CM | POA: Diagnosis not present

## 2022-03-16 DIAGNOSIS — R7309 Other abnormal glucose: Secondary | ICD-10-CM | POA: Diagnosis not present

## 2022-03-16 DIAGNOSIS — Z85828 Personal history of other malignant neoplasm of skin: Secondary | ICD-10-CM

## 2022-03-16 DIAGNOSIS — I10 Essential (primary) hypertension: Secondary | ICD-10-CM

## 2022-03-16 DIAGNOSIS — F0280 Dementia in other diseases classified elsewhere without behavioral disturbance: Secondary | ICD-10-CM | POA: Diagnosis not present

## 2022-03-16 DIAGNOSIS — Z87891 Personal history of nicotine dependence: Secondary | ICD-10-CM

## 2022-03-16 DIAGNOSIS — Z0001 Encounter for general adult medical examination with abnormal findings: Secondary | ICD-10-CM

## 2022-03-16 DIAGNOSIS — E559 Vitamin D deficiency, unspecified: Secondary | ICD-10-CM | POA: Diagnosis not present

## 2022-03-16 DIAGNOSIS — G309 Alzheimer's disease, unspecified: Secondary | ICD-10-CM | POA: Diagnosis not present

## 2022-03-16 DIAGNOSIS — F0150 Vascular dementia without behavioral disturbance: Secondary | ICD-10-CM | POA: Diagnosis not present

## 2022-03-16 DIAGNOSIS — Z0000 Encounter for general adult medical examination without abnormal findings: Secondary | ICD-10-CM

## 2022-03-17 LAB — COMPLETE METABOLIC PANEL WITH GFR
AG Ratio: 1.5 (calc) (ref 1.0–2.5)
ALT: 23 U/L (ref 9–46)
AST: 22 U/L (ref 10–35)
Albumin: 4 g/dL (ref 3.6–5.1)
Alkaline phosphatase (APISO): 73 U/L (ref 35–144)
BUN: 22 mg/dL (ref 7–25)
CO2: 30 mmol/L (ref 20–32)
Calcium: 9.4 mg/dL (ref 8.6–10.3)
Chloride: 106 mmol/L (ref 98–110)
Creat: 1.19 mg/dL (ref 0.70–1.22)
Globulin: 2.7 g/dL (calc) (ref 1.9–3.7)
Glucose, Bld: 108 mg/dL — ABNORMAL HIGH (ref 65–99)
Potassium: 4.7 mmol/L (ref 3.5–5.3)
Sodium: 143 mmol/L (ref 135–146)
Total Bilirubin: 0.7 mg/dL (ref 0.2–1.2)
Total Protein: 6.7 g/dL (ref 6.1–8.1)
eGFR: 60 mL/min/{1.73_m2} (ref >=60)

## 2022-03-17 LAB — LIPID PANEL
Cholesterol: 134 mg/dL (ref <200)
HDL: 47 mg/dL (ref >=40)
LDL Cholesterol (Calc): 69 mg/dL (calc)
Non-HDL Cholesterol (Calc): 87 mg/dL (calc) (ref <130)
Total CHOL/HDL Ratio: 2.9 (calc) (ref <5.0)
Triglycerides: 94 mg/dL (ref <150)

## 2022-03-17 LAB — CBC WITH DIFFERENTIAL/PLATELET
Absolute Monocytes: 668 cells/uL (ref 200–950)
Basophils Absolute: 62 cells/uL (ref 0–200)
Basophils Relative: 0.7 %
Eosinophils Absolute: 223 cells/uL (ref 15–500)
Eosinophils Relative: 2.5 %
HCT: 45.2 % (ref 38.5–50.0)
Hemoglobin: 14.7 g/dL (ref 13.2–17.1)
Lymphs Abs: 1575 cells/uL (ref 850–3900)
MCH: 29.3 pg (ref 27.0–33.0)
MCHC: 32.5 g/dL (ref 32.0–36.0)
MCV: 90.2 fL (ref 80.0–100.0)
MPV: 10.9 fL (ref 7.5–12.5)
Monocytes Relative: 7.5 %
Neutro Abs: 6372 cells/uL (ref 1500–7800)
Neutrophils Relative %: 71.6 %
Platelets: 267 10*3/uL (ref 140–400)
RBC: 5.01 10*6/uL (ref 4.20–5.80)
RDW: 13 % (ref 11.0–15.0)
Total Lymphocyte: 17.7 %
WBC: 8.9 10*3/uL (ref 3.8–10.8)

## 2022-03-17 LAB — MAGNESIUM: Magnesium: 2.3 mg/dL (ref 1.5–2.5)

## 2022-03-30 DIAGNOSIS — D0422 Carcinoma in situ of skin of left ear and external auricular canal: Secondary | ICD-10-CM | POA: Diagnosis not present

## 2022-03-30 DIAGNOSIS — Z481 Encounter for planned postprocedural wound closure: Secondary | ICD-10-CM | POA: Diagnosis not present

## 2022-03-30 DIAGNOSIS — C44229 Squamous cell carcinoma of skin of left ear and external auricular canal: Secondary | ICD-10-CM | POA: Diagnosis not present

## 2022-03-30 DIAGNOSIS — D485 Neoplasm of uncertain behavior of skin: Secondary | ICD-10-CM | POA: Diagnosis not present

## 2022-03-30 DIAGNOSIS — D049 Carcinoma in situ of skin, unspecified: Secondary | ICD-10-CM | POA: Diagnosis not present

## 2022-03-30 DIAGNOSIS — C4442 Squamous cell carcinoma of skin of scalp and neck: Secondary | ICD-10-CM | POA: Diagnosis not present

## 2022-04-20 DIAGNOSIS — C44629 Squamous cell carcinoma of skin of left upper limb, including shoulder: Secondary | ICD-10-CM | POA: Diagnosis not present

## 2022-04-30 ENCOUNTER — Other Ambulatory Visit: Payer: Self-pay

## 2022-04-30 ENCOUNTER — Emergency Department (HOSPITAL_BASED_OUTPATIENT_CLINIC_OR_DEPARTMENT_OTHER): Payer: Medicare Other

## 2022-04-30 ENCOUNTER — Emergency Department (HOSPITAL_BASED_OUTPATIENT_CLINIC_OR_DEPARTMENT_OTHER)
Admission: EM | Admit: 2022-04-30 | Discharge: 2022-04-30 | Disposition: A | Discharge status: Home / Self Care | Payer: Medicare Other | Attending: Emergency Medicine | Admitting: Emergency Medicine

## 2022-04-30 ENCOUNTER — Encounter (HOSPITAL_BASED_OUTPATIENT_CLINIC_OR_DEPARTMENT_OTHER): Payer: Self-pay

## 2022-04-30 DIAGNOSIS — Z7982 Long term (current) use of aspirin: Secondary | ICD-10-CM | POA: Diagnosis not present

## 2022-04-30 DIAGNOSIS — Z7901 Long term (current) use of anticoagulants: Secondary | ICD-10-CM | POA: Insufficient documentation

## 2022-04-30 DIAGNOSIS — R42 Dizziness and giddiness: Secondary | ICD-10-CM | POA: Diagnosis not present

## 2022-04-30 LAB — URINALYSIS, ROUTINE W REFLEX MICROSCOPIC
Bilirubin Urine: NEGATIVE
Glucose, UA: NEGATIVE mg/dL
Hgb urine dipstick: NEGATIVE
Ketones, ur: NEGATIVE mg/dL
Leukocytes,Ua: NEGATIVE
Nitrite: NEGATIVE
Specific Gravity, Urine: 1.025 (ref 1.005–1.030)
pH: 5.5 (ref 5.0–8.0)

## 2022-04-30 LAB — BASIC METABOLIC PANEL
Anion gap: 7 (ref 5–15)
BUN: 19 mg/dL (ref 8–23)
CO2: 28 mmol/L (ref 22–32)
Calcium: 9.9 mg/dL (ref 8.9–10.3)
Chloride: 105 mmol/L (ref 98–111)
Creatinine, Ser: 1.13 mg/dL (ref 0.61–1.24)
GFR, Estimated: >60 mL/min (ref >60)
Glucose, Bld: 105 mg/dL — ABNORMAL HIGH (ref 70–99)
Potassium: 4 mmol/L (ref 3.5–5.1)
Sodium: 140 mmol/L (ref 135–145)

## 2022-04-30 LAB — CBC
HCT: 46 % (ref 39.0–52.0)
Hemoglobin: 14.8 g/dL (ref 13.0–17.0)
MCH: 29.8 pg (ref 26.0–34.0)
MCHC: 32.2 g/dL (ref 30.0–36.0)
MCV: 92.6 fL (ref 80.0–100.0)
Platelets: 279 10*3/uL (ref 150–400)
RBC: 4.97 MIL/uL (ref 4.22–5.81)
RDW: 13.8 % (ref 11.5–15.5)
WBC: 8.9 10*3/uL (ref 4.0–10.5)
nRBC: 0 % (ref 0.0–0.2)

## 2022-04-30 LAB — CBG MONITORING, ED: Glucose-Capillary: 116 mg/dL — ABNORMAL HIGH (ref 70–99)

## 2022-04-30 NOTE — ED Notes (Signed)
Patient verbalizes understanding of discharge instructions. Opportunity for questioning and answers were provided. Patient discharged from ED.  °

## 2022-04-30 NOTE — Discharge Instructions (Signed)
Call your doctor today to schedule an appointment to be seen next week ?

## 2022-04-30 NOTE — ED Provider Notes (Signed)
?Brian Proctor ?Provider Note ? ? ?CSN: [id number] ?Arrival date & time: 04/30/22  1127 ? ?  ? ?History ? ?Chief Complaint  ?Patient presents with  ? Dizziness  ? ? ?Brian Proctor is a 86 y.o. male. ? ?86 year old male presents due to yesterday having a period of feeling off balance and dizzy when walking.  Was on a trail and all of a sudden felt like he wanted to lean towards his right side.  States he can tell this was occurring and he shifted his weight to the left side and had assistance from a friend.  Symptoms lasted for about 30 to 45 minutes.  They resolve spontaneously and he was able to drive his car home.  Since that time he has been at his baseline.  During the event, he did not have any headache.  Did not have any visual changes.  Did not have any weakness in his arms or legs.  No vertiginous symptoms.  Had this happen 1 time before in the past.  His called his doctor and told to come here for further evaluation ? ? ?  ? ?Home Medications ?Prior to Admission medications   ?Medication Sig Start Date End Date Taking? Authorizing Provider  ?aspirin 81 MG tablet Take 81 mg by mouth daily.    [provider]  ?atorvastatin (LIPITOR) 40 MG tablet TAKE 1 TABLET BY MOUTH EVERY OTHER DAY FOR CHOLESTEROL 01/25/22   Magda Bernheim, NP  ?cholecalciferol (VITAMIN D3) 25 MCG (1000 UNIT) tablet Take 1,000 Units by mouth in the morning and at bedtime.    [provider]  ?CINNAMON PO Take 2,000 mg by mouth daily.    [provider]  ?Coenzyme Q10 200 MG TABS Take 200 mg by mouth daily.    [provider]  ?donepezil (ARICEPT) 10 MG tablet Take 10 mg by mouth at bedtime.    [provider]  ?fish oil-omega-3 fatty acids 1000 MG capsule Take 1,200 mg by mouth daily.    [provider]  ?Magnesium 250 MG TABS Take 500 mg by mouth daily.    [provider]  ?Multiple Vitamins-Minerals (MULTIVITAMIN PO) Take by mouth daily.    [provider]  ?rivaroxaban (XARELTO) 20 MG TABS tablet Take  1 tablet  Daily  to Prevent Blood Clots                                        /                              TAKE 1 TABLET(20 MG) BY MOUTH 11/28/21   Unk Pinto, MD  ?triamcinolone ointment (KENALOG) 0.1 % Apply 1 application topically 2 (two) times daily. Apply to Rash 2 to 3 x /day 06/01/21   Unk Pinto, MD  ?vitamin C (ASCORBIC ACID) 500 MG tablet Take 1,000 mg by mouth daily.    [provider]  ?zinc gluconate 50 MG tablet Take 50 mg by mouth daily.    [provider]  ?   ? ?Allergies    ?Patient has no known allergies.   ? ?Review of Systems   ?Review of Systems  ?All other systems reviewed and are negative. ? ?Physical Exam ?Updated Vital Signs ?BP 138/77   Pulse 62   Temp 97.6 ?F (36.4 ?  C)   Resp 16   Ht 1.778 m ('5\' 10"'$ )   Wt 68.1 kg   SpO2 100%   BMI 21.54 kg/m?  ?Physical Exam ?Vitals and nursing note reviewed.  ?Constitutional:   ?   General: He is not in acute distress. ?   Appearance: Normal appearance. He is well-developed. He is not toxic-appearing.  ?HENT:  ?   Head: Normocephalic and atraumatic.  ?Eyes:  ?   General: Lids are normal.  ?   Conjunctiva/sclera: Conjunctivae normal.  ?   Pupils: Pupils are equal, round, and reactive to light.  ?Neck:  ?   Thyroid: No thyroid mass.  ?   Trachea: No tracheal deviation.  ?Cardiovascular:  ?   Rate and Rhythm: Normal rate and regular rhythm.  ?   Heart sounds: Normal heart sounds. No murmur heard. ?  No gallop.  ?Pulmonary:  ?   Effort: Pulmonary effort is normal. No respiratory distress.  ?   Breath sounds: Normal breath sounds. No stridor. No decreased breath sounds, wheezing, rhonchi or rales.  ?Abdominal:  ?   General: There is no distension.  ?   Palpations: Abdomen is soft.  ?   Tenderness: There is no abdominal tenderness. There is no rebound.  ?Musculoskeletal:     ?   General: No tenderness. Normal range of motion.  ?   Cervical back: Normal range of  motion and neck supple.  ?Skin: ?   General: Skin is warm and dry.  ?   Findings: No abrasion or rash.  ?Neurological:  ?   General: No focal deficit present.  ?   Mental Status: He is alert and oriented to person, place, and time. Mental status is at baseline.  ?   GCS: GCS eye subscore is 4. GCS verbal subscore is 5. GCS motor subscore is 6.  ?   Cranial Nerves: No cranial nerve deficit.  ?   Sensory: No sensory deficit.  ?   Motor: Motor function is intact.  ?   Gait: Gait is intact.  ?Psychiatric:     ?   Attention and Perception: Attention normal.     ?   Speech: Speech normal.     ?   Behavior: Behavior normal.  ? ? ?ED Results / Procedures / Treatments   ?Labs ?(all labs ordered are listed, but only abnormal results are displayed) ?Labs Reviewed  ?BASIC METABOLIC PANEL - Abnormal; Notable for the following components:  ?    Result Value  ? Glucose, Bld 105 (*)   ? All other components within normal limits  ?URINALYSIS, ROUTINE W REFLEX MICROSCOPIC - Abnormal; Notable for the following components:  ? Protein, ur TRACE (*)   ? All other components within normal limits  ?CBG MONITORING, ED - Abnormal; Notable for the following components:  ? Glucose-Capillary 116 (*)   ? All other components within normal limits  ?CBC  ? ? ?EKG ?EKG Interpretation ? ?Date/Time:  Friday Apr 30 2022 12:02:56 EDT ?Ventricular Rate:  66 ?PR Interval:  202 ?QRS Duration: 84 ?QT Interval:  406 ?QTC Calculation: 425 ?R Axis:   -82 ?Text Interpretation: Normal sinus rhythm Left anterior fascicular block Nonspecific ST abnormality Abnormal ECG When compared with ECG of 10-Jan-2013 13:50, T wave inversion now evident in Inferior leads Confirmed by Lacretia Leigh ([id number]) on 04/30/2022 12:55:30 PM ? ?Radiology ?No results found. ? ?Procedures ?Procedures  ? ? ?Medications Ordered in ED ?Medications - No data to display ? ?ED Course/ Medical  Decision Making/ A&P ?  ?                        ?Medical Decision Making ?Amount and/or Complexity of  Data Reviewed ?Labs: ordered. ?Radiology: ordered. ? ? ?Patient with normal neurological function here.  EKG per my interpretation shows no signs of acute coronary ischemia.  Urinalysis negative.  Head CT per my review interpretation showed no acute findings.  Low suspicion that this represents TIA.  No signs of acute CVA.  Patient will need follow-up with his primary care doctor.  Relative in room is comfortable with this.  Patient to be discharged ? ? ? ? ? ? ? ?Final Clinical Impression(s) / ED Diagnoses ?Final diagnoses:  ?None  ? ? ?Rx / DC Orders ?ED Discharge Orders   ? ? None  ? ?  ? ? ?  ?Lacretia Leigh, MD ?04/30/22 1354 ? ?

## 2022-04-30 NOTE — ED Notes (Signed)
Pt ambulated to the room and to the bathroom well. ?

## 2022-04-30 NOTE — ED Triage Notes (Signed)
Patient here POV from Home. ? ?Endorses walking on a Trail yesterday when he nearly fell. Patient is unsure why but endorses he was "Leaning to the Right". No Fall. Was assisted to Vehicle by [employer]. Occurred at 1600 yesterday. ? ?No Pain. No Current Complaints.  ? ?NAD Noted during Triage. A&Ox4. GCS 15. Ambulatory. ?

## 2022-05-02 NOTE — Progress Notes (Signed)
? ? ?Future Appointments  ?Date Time Provider Department  ?05/03/2022 10:30 AM Unk Pinto, MD [facility]  ?06/22/2022               6 mon OV 10:30 AM Unk Pinto, MD [facility]  ?07/13/2022 11:15 AM Carleene Mains, RPH [facility]  ?12/15/2022             CPE 11:00 AM Unk Pinto, MD [facility]  ?03/17/2023                  Wellness 11:00 AM Magda Bernheim, NP [facility]  ? ? ?History of Present Illness: ? ?          This very nice 86 y.o. [name] with HTN, HLD, Prediabetes,  mild vascular Dementia /"cognitive impairment" and Vitamin D Deficiency was seen in the ER  3 days prior on May 5 for a transient  30-45 minute of  sensation of gait imbalance . W/U included negative labs, Head CT  & EKF & IA was felt unlikely. Patient was recommended PT evaluate for Balance / Gait therapy.  ? ?     Today, relates 2 to 3 brief episodes of becoming lightheaded and feeling unsteady especially when walking the trails around [facility] in the early afternoon after lunch. Denies actual syncope or LOC or focal neuro signs.  ?         ? ? ?Medications ? ?  atorvastatin (40 MG tablet, TAKE 1 TABLET  EVERY OTHER DAY  ?  aspirin 81 MG tablet, Take daily. ?  rivaroxaban 20 MG TABS tablet, Take  1 tablet  Daily  t ?Current Outpatient Medications (Other):  ?  VITAMIN D 1000 u, Take 1,000 Units in the morning and at bedtime. ?  CINNAMON 2,000 mg, Take daily. ?  Coenzyme Q10 200 MG TABS, Take daily. ?  donepezil (ARICEPT) 10 MG , Take 1 bedtime. ?  fish oil-omega-3   1,200 mg cap  , Take daily. ?  Magnesium 250 MG TABS, Take 500 mg daily. ?  Multiple Vitamins-Minerals , Take  daily. ?  triamcinolone oint 0.1 %, Apply to Rash 2 to 3 x /day ?  vitamin C 500 MG tablet, Take 1,000 mg  daily. ?  zinc 50 MG tablet, Take daily. ? ?Problem list ?He has Hyperlipidemia, mixed; Essential hypertension; GERD (gastroesophageal reflux disease); Vitamin D deficiency; Medication management; History of prostate cancer; Encounter for Medicare annual  wellness exam; BMI 22.0-22.9, adult; History of basal cell carcinoma; Former smoker; FH: heart disease; COPD (chronic obstructive pulmonary disease) ([place]); Abnormal glucose; and Vascular dementia without behavioral disturbance ([place]) on their problem list. ?  ?Observations/Objective: ? ?BP      110/60 - laying down,     100/60 - Sitting,     80/60 - Standing   / ? ?P   73   T 97.9 ?F  R 17   Ht '5\' 10"'$    Wt 148 lb 6.4 oz  SpO2 97%   BMI 21.29  ? ?HEENT - WNL. ?Neck - supple.  ?Chest - Clear equal BS. ?Cor - Nl HS. RRR w/o sig MGR. PP 1(+). No edema. ?MS- FROM w/o deformities.  Gait Nl. ?Neuro -  Nl w/o focal abnormalities. ? ? ?Assessment and Plan: ? ?1. Postural hypotension ? ?- fludrocortisone (FLORINEF) 0.1 MG tablet;  ?Take  1 tablet  2 to 3 x /day  to Raise BP  Dispense: 270 tablet; Refill: 3 ? ?( Hold if Sys BP > 150) .  ? ? ?  2. Unstable gait ? ? ?3. At high risk for falls ? ?- Patient declined PT referral for balance.  ? ?4. Cognitive impairment ? ? ? ?- Wife to monitor postural Sitting / Standing BP  & Pulses 2 x /day &  ? especially before he goes out walking the trails.  ?Encouraged not to walk alone.  ? ?Follow Up Instructions: ?  ?    I discussed the assessment and treatment plan with the patient. The patient was provided an opportunity to ask questions and all were answered. The patient agreed with the plan and demonstrated an understanding of the instructions. ?  ?    The patient was advised to call back or seek an in-person evaluation if the symptoms worsen or if the condition fails to improve as anticipated. ? ? ? ?Kirtland Bouchard, MD ? ?

## 2022-05-03 ENCOUNTER — Ambulatory Visit (INDEPENDENT_AMBULATORY_CARE_PROVIDER_SITE_OTHER): Payer: Medicare Other | Admitting: Internal Medicine

## 2022-05-03 ENCOUNTER — Encounter: Payer: Self-pay | Admitting: Internal Medicine

## 2022-05-03 VITALS — BP 100/60 | HR 73 | Temp 97.9°F | Resp 17 | Ht 70.0 in | Wt 148.4 lb

## 2022-05-03 DIAGNOSIS — R0989 Other specified symptoms and signs involving the circulatory and respiratory systems: Secondary | ICD-10-CM

## 2022-05-03 DIAGNOSIS — Z9181 History of falling: Secondary | ICD-10-CM | POA: Diagnosis not present

## 2022-05-03 DIAGNOSIS — I951 Orthostatic hypotension: Secondary | ICD-10-CM

## 2022-05-03 DIAGNOSIS — R4189 Other symptoms and signs involving cognitive functions and awareness: Secondary | ICD-10-CM | POA: Diagnosis not present

## 2022-05-03 DIAGNOSIS — R2681 Unsteadiness on feet: Secondary | ICD-10-CM

## 2022-05-03 MED ORDER — FLUDROCORTISONE ACETATE 0.1 MG PO TABS: ORAL_TABLET | ORAL | 3 refills | Status: DC

## 2022-05-03 NOTE — Patient Instructions (Addendum)
Orthostatic Hypotension ?Blood pressure is a measurement of how strongly, or weakly, your circulating blood is pressing against the walls of your arteries. Orthostatic hypotension is a drop in blood pressure that can happen when you change positions, such as when you go from lying down to standing. ?Arteries are blood vessels that carry blood from your heart throughout your body. When blood pressure is too low, you may not get enough blood to your brain or to the rest of your organs. Orthostatic hypotension can cause light-headedness, sweating, rapid heartbeat, blurred vision, and fainting. These symptoms require further investigation into the cause. ?What are the causes? ?Orthostatic hypotension can be caused by many things, including: ?Sudden changes in posture, such as standing up quickly after you have been sitting or lying down. ?Loss of blood (anemia) or loss of body fluids (dehydration). ?Heart problems, neurologic problems, or hormone problems. ?Pregnancy. ?Aging. The risk for this condition increases as you get older. ?Severe infection (sepsis). ?Certain medicines, such as medicines for high blood pressure or medicines that make the body lose excess fluids (diuretics). ?What are the signs or symptoms? ?Symptoms of this condition may include: ?Weakness, light-headedness, or dizziness. ?Sweating. ?Blurred vision. ?Tiredness (fatigue). ?Rapid heartbeat. ?Fainting, in severe cases. ?How is this diagnosed? ?This condition is diagnosed based on: ?Your symptoms and medical history. ?Your blood pressure measurements. Your health care provider will check your blood pressure when you are: ?Lying down. ?Sitting. ?Standing. ?A blood pressure reading is recorded as two numbers, such as "120 over 80" (or 120/80). The first ("top") number is called the systolic pressure. It is a measure of the pressure in your arteries as your heart beats. The second ("bottom") number is called the diastolic pressure. It is a measure of  the pressure in your arteries when your heart relaxes between beats. Blood pressure is measured in a unit called mmHg. Healthy blood pressure for most adults is 120/80 mmHg. Orthostatic hypotension is defined as a 20 mmHg drop in systolic pressure or a 10 mmHg drop in diastolic pressure within 3 minutes of standing. ?Other information or tests that may be used to diagnose orthostatic hypotension include: ?Your other vital signs, such as your heart rate and temperature. ?Blood tests. ?An electrocardiogram (ECG) or echocardiogram. ?A Holter monitor. This is a device you wear that records your heart rhythm continuously, usually for 24-48 hours. ?Tilt table test. For this test, you will be safely secured to a table that moves you from a lying position to an upright position. Your heart rhythm and blood pressure will be monitored during the test. ?How is this treated? ?This condition may be treated by: ?Changing your diet. This may involve eating more salt (sodium) or drinking more water. ?Changing the dosage of certain medicines you are taking that might be lowering your blood pressure. ?Correcting the underlying reason for the orthostatic hypotension. ?Wearing compression stockings. ?Taking medicines to raise your blood pressure. ?Avoiding actions that trigger symptoms. ?Follow these instructions at home: ?Medicines ?Take over-the-counter and prescription medicines only as told by your health care provider. ?Follow instructions from your health care provider about changing the dosage of your current medicines, if this applies. ?Do not stop or adjust any of your medicines on your own. ?Eating and drinking ? ?Drink enough fluid to keep your urine pale yellow. ?Eat extra salt only as directed. Do not add extra salt to your diet unless advised by your health care provider. ?Eat frequent, small meals. ?Avoid standing up suddenly after eating. ?General instructions ? ?  Get up slowly from lying down or sitting positions. This  gives your blood pressure a chance to adjust. ?Avoid hot showers and excessive heat as directed by your health care provider. ?Engage in regular physical activity as directed by your health care provider. ?If you have compression stockings, wear them as told. ?Keep all follow-up visits. This is important. ?Contact a health care provider if: ?You have a fever for more than 2-3 days. ?You feel more thirsty than usual. ?You feel dizzy or weak. ?Get help right away if: ?You have chest pain. ?You have a fast or irregular heartbeat. ?You become sweaty or feel light-headed. ?You feel short of breath. ?You faint. ?You have any symptoms of a stroke. "BE FAST" is an easy way to remember the main warning signs of a stroke: ?B - Balance. Signs are dizziness, sudden trouble walking, or loss of balance. ?E - Eyes. Signs are trouble seeing or a sudden change in vision. ?F - Face. Signs are sudden weakness or numbness of the face, or the face or eyelid drooping on one side. ?A - Arms. Signs are weakness or numbness in an arm. This happens suddenly and usually on one side of the body. ?S - Speech. Signs are sudden trouble speaking, slurred speech, or trouble understanding what people say. ?T - Time. Time to call emergency services. Write down what time symptoms started. ?You have other signs of a stroke, such as: ?A sudden, severe headache with no known cause. ?Nausea or vomiting. ?Seizure. ?These symptoms may represent a serious problem that is an emergency. Do not wait to see if the symptoms will go away. Get medical help right away. Call your local emergency services (911 in the [place] Do not drive yourself to the hospital. ?Summary ?Orthostatic hypotension is a sudden drop in blood pressure. ?It can cause light-headedness, sweating, rapid heartbeat, blurred vision, and fainting. ?Orthostatic hypotension can be diagnosed by having your blood pressure taken while lying down, sitting, and then standing. ?Treatment may involve  changing your diet, wearing compression stockings, sitting up slowly, adjusting your medicines, or correcting the underlying reason for the orthostatic hypotension. ?Get help right away if you have chest pain, a fast or irregular heartbeat, or symptoms of a stroke. ?-------------------------------- ?Understanding Your Risk for Falls ?Each year, millions of people have serious injuries from falls. It is important to understand your risk for falling. Talk with your health care provider about your risk and what you can do to lower it. There are actions you can take at home to lower your risk and prevent falls. ?If you do have a serious fall, make sure to tell your health care provider. Falling once raises your risk of falling again. ?How can falls affect me? ?Serious injuries from falls are common. These include: ?Broken bones, such as hip fractures. ?Head injuries, such as traumatic brain injuries (TBI) or concussion. ?A fear of falling can cause you to avoid activities and stay at home. This can make your muscles weaker and actually raise your risk for a fall. ?What can increase my risk? ?There are a number of risk factors that increase your risk for falling. The more risk factors you have, the higher your risk of falling. Serious injuries from a fall happen most often to people older than age 25. Children and young adults ages 29-29 are also at higher risk. ?Common risk factors include: ?Weakness in the lower body. ?Lack (deficiency) of vitamin D. ?Being generally weak or confused due to long-term (chronic) illness. ?Dizziness  or balance problems. ?Poor vision. ?Medicines that cause dizziness or drowsiness. These can include medicines for your blood pressure, heart, anxiety, insomnia, or edema, as well as pain medicines and muscle relaxants. ?Other risk factors include: ?Drinking alcohol. ?Having had a fall in the past. ?Having depression. ?Having foot pain or wearing improper footwear. ?Working at a dangerous  job. ?Having any of the following in your home: ?Tripping hazards, such as floor clutter or loose rugs. ?Poor lighting. ?Pets. ?Having dementia or memory loss. ?What actions can I take to lower my risk of fallin

## 2022-05-11 DIAGNOSIS — C4442 Squamous cell carcinoma of skin of scalp and neck: Secondary | ICD-10-CM | POA: Diagnosis not present

## 2022-05-11 DIAGNOSIS — D485 Neoplasm of uncertain behavior of skin: Secondary | ICD-10-CM | POA: Diagnosis not present

## 2022-05-19 DIAGNOSIS — L089 Local infection of the skin and subcutaneous tissue, unspecified: Secondary | ICD-10-CM | POA: Diagnosis not present

## 2022-06-03 DIAGNOSIS — T8140XA Infection following a procedure, unspecified, initial encounter: Secondary | ICD-10-CM | POA: Diagnosis not present

## 2022-06-22 ENCOUNTER — Ambulatory Visit (INDEPENDENT_AMBULATORY_CARE_PROVIDER_SITE_OTHER): Payer: Medicare Other | Admitting: Internal Medicine

## 2022-06-22 ENCOUNTER — Encounter: Payer: Self-pay | Admitting: Internal Medicine

## 2022-06-22 VITALS — BP 124/68 | HR 66 | Temp 97.9°F | Resp 17 | Ht 70.0 in | Wt 145.6 lb

## 2022-06-22 DIAGNOSIS — E559 Vitamin D deficiency, unspecified: Secondary | ICD-10-CM

## 2022-06-22 DIAGNOSIS — R0989 Other specified symptoms and signs involving the circulatory and respiratory systems: Secondary | ICD-10-CM | POA: Diagnosis not present

## 2022-06-22 DIAGNOSIS — Z79899 Other long term (current) drug therapy: Secondary | ICD-10-CM | POA: Diagnosis not present

## 2022-06-22 DIAGNOSIS — I951 Orthostatic hypotension: Secondary | ICD-10-CM

## 2022-06-22 DIAGNOSIS — E782 Mixed hyperlipidemia: Secondary | ICD-10-CM | POA: Diagnosis not present

## 2022-06-22 DIAGNOSIS — F0150 Vascular dementia without behavioral disturbance: Secondary | ICD-10-CM

## 2022-06-22 DIAGNOSIS — R7309 Other abnormal glucose: Secondary | ICD-10-CM

## 2022-06-23 LAB — CBC WITH DIFFERENTIAL/PLATELET
Absolute Monocytes: 486 cells/uL (ref 200–950)
Basophils Absolute: 81 cells/uL (ref 0–200)
Basophils Relative: 0.9 %
Eosinophils Absolute: 261 cells/uL (ref 15–500)
Eosinophils Relative: 2.9 %
HCT: 43.6 % (ref 38.5–50.0)
Hemoglobin: 14.7 g/dL (ref 13.2–17.1)
Lymphs Abs: 1620 cells/uL (ref 850–3900)
MCH: 30.9 pg (ref 27.0–33.0)
MCHC: 33.7 g/dL (ref 32.0–36.0)
MCV: 91.6 fL (ref 80.0–100.0)
MPV: 10.9 fL (ref 7.5–12.5)
Monocytes Relative: 5.4 %
Neutro Abs: 6552 cells/uL (ref 1500–7800)
Neutrophils Relative %: 72.8 %
Platelets: 236 10*3/uL (ref 140–400)
RBC: 4.76 10*6/uL (ref 4.20–5.80)
RDW: 12.7 % (ref 11.0–15.0)
Total Lymphocyte: 18 %
WBC: 9 10*3/uL (ref 3.8–10.8)

## 2022-06-23 LAB — COMPLETE METABOLIC PANEL WITH GFR
AG Ratio: 1.5 (calc) (ref 1.0–2.5)
ALT: 23 U/L (ref 9–46)
AST: 24 U/L (ref 10–35)
Albumin: 4.2 g/dL (ref 3.6–5.1)
Alkaline phosphatase (APISO): 73 U/L (ref 35–144)
BUN: 24 mg/dL (ref 7–25)
CO2: 29 mmol/L (ref 20–32)
Calcium: 9.3 mg/dL (ref 8.6–10.3)
Chloride: 108 mmol/L (ref 98–110)
Creat: 1.14 mg/dL (ref 0.70–1.22)
Globulin: 2.8 g/dL (calc) (ref 1.9–3.7)
Glucose, Bld: 135 mg/dL — ABNORMAL HIGH (ref 65–99)
Potassium: 4.6 mmol/L (ref 3.5–5.3)
Sodium: 145 mmol/L (ref 135–146)
Total Bilirubin: 0.7 mg/dL (ref 0.2–1.2)
Total Protein: 7 g/dL (ref 6.1–8.1)
eGFR: 63 mL/min/{1.73_m2} (ref >=60)

## 2022-06-23 LAB — HEMOGLOBIN A1C
Hgb A1c MFr Bld: 5.3 % of total Hgb (ref <5.7)
Mean Plasma Glucose: 105 mg/dL
eAG (mmol/L): 5.8 mmol/L

## 2022-06-23 LAB — LIPID PANEL
Cholesterol: 129 mg/dL (ref <200)
HDL: 48 mg/dL (ref >=40)
LDL Cholesterol (Calc): 63 mg/dL (calc)
Non-HDL Cholesterol (Calc): 81 mg/dL (calc) (ref <130)
Total CHOL/HDL Ratio: 2.7 (calc) (ref <5.0)
Triglycerides: 95 mg/dL (ref <150)

## 2022-06-23 LAB — INSULIN, RANDOM: Insulin: 64.9 u[IU]/mL — ABNORMAL HIGH

## 2022-06-23 LAB — MAGNESIUM: Magnesium: 2.3 mg/dL (ref 1.5–2.5)

## 2022-06-23 LAB — TSH: TSH: 2.9 mIU/L (ref 0.40–4.50)

## 2022-06-23 LAB — VITAMIN D 25 HYDROXY (VIT D DEFICIENCY, FRACTURES): Vit D, 25-Hydroxy: 84 ng/mL (ref 30–100)

## 2022-06-23 NOTE — Progress Notes (Signed)
<><><><><><><><><><><><><><><><><><><><><><><><><><><><><><><><><> <><><><><><><><><><><><><><><><><><><><><><><><><><><><><><><><><> -   Test results slightly outside the reference range are not unusual. If there is anything important, I will review this with you,  otherwise it is considered normal test values.  If you have further questions,  please do not hesitate to contact me at the office or via My Chart.  <><><><><><><><><><><><><><><><><><><><><><><><><><><><><><><><><> <><><><><><><><><><><><><><><><><><><><><><><><><><><><><><><><><>  -  Total Chol = 129    &   LDL Chol = 63 - Both . Excellent   - Very low risk for Heart Attack  / Stroke <><><><><><><><><><><><><><><><><><><><><><><><><><><><><><><><><>  -  Vitamin D = 84 -  Excellent   - Very low risk for Heart Attack  / Stroke <><><><><><><><><><><><><><><><><><><><><><><><><><><><><><><><><>  -  A1c - Normal - No Diabetes  - Great  !  <><><><><><><><><><><><><><><><><><><><><><><><><><><><><><><><><>  -  All Else - CBC - Kidneys - Electrolytes - Liver - Magnesium & Thyroid    - all  Normal / OK <><><><><><><><><><><><><><><><><><><><><><><><><><><><><><><><><>  -  Keep up the [place] Work   !  <><><><><><><><><><><><><><><><><><><><><><><><><><><><><><><><><>

## 2022-07-08 ENCOUNTER — Telehealth: Payer: Self-pay

## 2022-07-08 NOTE — Telephone Encounter (Signed)
LM-07/08/22-Called pt. Wife And confirmed CP focus outreach for 7/18. Pt. Aware time for appointment will be between 3-5pm.   Total time spent: 15 min.

## 2022-07-09 DIAGNOSIS — C44329 Squamous cell carcinoma of skin of other parts of face: Secondary | ICD-10-CM | POA: Diagnosis not present

## 2022-07-13 ENCOUNTER — Ambulatory Visit: Payer: Medicare Other | Admitting: Pharmacy Technician

## 2022-07-13 DIAGNOSIS — F0150 Vascular dementia without behavioral disturbance: Secondary | ICD-10-CM

## 2022-07-13 DIAGNOSIS — I10 Essential (primary) hypertension: Secondary | ICD-10-CM

## 2022-07-13 DIAGNOSIS — Z79899 Other long term (current) drug therapy: Secondary | ICD-10-CM

## 2022-07-13 NOTE — Progress Notes (Addendum)
Spoke to wife today for focused pharmacist outreach. She states patient has been doing okay. States his memory is still not doing well. She asked if a neurology referral would be appropriate. Will discuss with PCP need for referral or addition of Namenda. She has been checking his BP every so often but cant tell a difference. When asked about fluid and food intake wife says he forgets both. I counseled to increase water and food intake and see if that helps with dizziness and falls.   Marda Stalker, PharmD Clinical Pharmacist [email or web address] [phone]  CPP Prep: 35mn CPP Televisit: 71m

## 2022-07-15 DIAGNOSIS — L089 Local infection of the skin and subcutaneous tissue, unspecified: Secondary | ICD-10-CM | POA: Diagnosis not present

## 2022-07-19 NOTE — Telephone Encounter (Signed)
Addendum 07/02/22-Chart Prep started, Reviewing OV, Consults, Hospital visits, Labs and medication changes.  Chart prep completed.  Total time spent: 40 min.

## 2022-07-22 DIAGNOSIS — Z4801 Encounter for change or removal of surgical wound dressing: Secondary | ICD-10-CM | POA: Diagnosis not present

## 2022-07-22 DIAGNOSIS — Z48817 Encounter for surgical aftercare following surgery on the skin and subcutaneous tissue: Secondary | ICD-10-CM | POA: Diagnosis not present

## 2022-08-13 ENCOUNTER — Telehealth: Payer: Self-pay

## 2022-08-13 NOTE — Telephone Encounter (Signed)
LM-08/13/22-Chart review started. Reviewing OV, Consults, Hospital visits, Labs and medication changes. Chart reviewed. Called pt. To complete General Review Call. Unable to reach pt. [place] X1.  Total time spent: 6 min.

## 2022-08-16 NOTE — Telephone Encounter (Signed)
LM-08/16/22-Called pt. To complete General Review Call. Unable to reach/ [facility] X2.  Total time spent: 2 min.

## 2022-08-17 NOTE — Telephone Encounter (Signed)
LM-08/17/22-Called pt. To complete General review call. Unable to reach.3rd attempt reaching patient. [place] X3.   Total time spent: 2 min.

## 2022-08-23 DIAGNOSIS — Z08 Encounter for follow-up examination after completed treatment for malignant neoplasm: Secondary | ICD-10-CM | POA: Diagnosis not present

## 2022-08-23 DIAGNOSIS — L821 Other seborrheic keratosis: Secondary | ICD-10-CM | POA: Diagnosis not present

## 2022-08-23 DIAGNOSIS — L814 Other melanin hyperpigmentation: Secondary | ICD-10-CM | POA: Diagnosis not present

## 2022-08-23 DIAGNOSIS — L570 Actinic keratosis: Secondary | ICD-10-CM | POA: Diagnosis not present

## 2022-08-23 DIAGNOSIS — L2389 Allergic contact dermatitis due to other agents: Secondary | ICD-10-CM | POA: Diagnosis not present

## 2022-08-23 DIAGNOSIS — L578 Other skin changes due to chronic exposure to nonionizing radiation: Secondary | ICD-10-CM | POA: Diagnosis not present

## 2022-08-23 DIAGNOSIS — Z85828 Personal history of other malignant neoplasm of skin: Secondary | ICD-10-CM | POA: Diagnosis not present

## 2022-08-23 DIAGNOSIS — D225 Melanocytic nevi of trunk: Secondary | ICD-10-CM | POA: Diagnosis not present

## 2022-09-14 DIAGNOSIS — L2389 Allergic contact dermatitis due to other agents: Secondary | ICD-10-CM | POA: Diagnosis not present

## 2022-09-14 DIAGNOSIS — L578 Other skin changes due to chronic exposure to nonionizing radiation: Secondary | ICD-10-CM | POA: Diagnosis not present

## 2022-09-23 ENCOUNTER — Ambulatory Visit: Payer: Medicare Other | Admitting: Nurse Practitioner

## 2022-09-27 ENCOUNTER — Other Ambulatory Visit: Payer: Self-pay | Admitting: Nurse Practitioner

## 2022-09-28 ENCOUNTER — Ambulatory Visit (INDEPENDENT_AMBULATORY_CARE_PROVIDER_SITE_OTHER): Payer: Medicare Other | Admitting: Nurse Practitioner

## 2022-09-28 ENCOUNTER — Encounter: Payer: Self-pay | Admitting: Nurse Practitioner

## 2022-09-28 VITALS — BP 122/64 | HR 56 | Temp 97.7°F | Ht 70.0 in | Wt 145.0 lb

## 2022-09-28 DIAGNOSIS — I10 Essential (primary) hypertension: Secondary | ICD-10-CM

## 2022-09-28 DIAGNOSIS — E782 Mixed hyperlipidemia: Secondary | ICD-10-CM

## 2022-09-28 DIAGNOSIS — Z86718 Personal history of other venous thrombosis and embolism: Secondary | ICD-10-CM | POA: Diagnosis not present

## 2022-09-28 DIAGNOSIS — Z79899 Other long term (current) drug therapy: Secondary | ICD-10-CM | POA: Diagnosis not present

## 2022-09-28 DIAGNOSIS — Z87891 Personal history of nicotine dependence: Secondary | ICD-10-CM

## 2022-09-28 DIAGNOSIS — R7309 Other abnormal glucose: Secondary | ICD-10-CM | POA: Diagnosis not present

## 2022-09-28 DIAGNOSIS — Z6822 Body mass index (BMI) 22.0-22.9, adult: Secondary | ICD-10-CM

## 2022-09-28 DIAGNOSIS — Z8546 Personal history of malignant neoplasm of prostate: Secondary | ICD-10-CM | POA: Diagnosis not present

## 2022-09-28 DIAGNOSIS — Z85828 Personal history of other malignant neoplasm of skin: Secondary | ICD-10-CM

## 2022-09-28 DIAGNOSIS — K219 Gastro-esophageal reflux disease without esophagitis: Secondary | ICD-10-CM

## 2022-09-28 DIAGNOSIS — R413 Other amnesia: Secondary | ICD-10-CM | POA: Diagnosis not present

## 2022-09-28 DIAGNOSIS — E559 Vitamin D deficiency, unspecified: Secondary | ICD-10-CM

## 2022-09-28 NOTE — Patient Instructions (Signed)

## 2022-09-28 NOTE — Progress Notes (Signed)
FOLLOW UP  Assessment:   Diagnoses and all orders for this visit:  Essential hypertension Discussed DASH (Dietary Approaches to Stop Hypertension) DASH diet is lower in sodium than a typical American diet. Cut back on foods that are high in saturated fat, cholesterol, and trans fats. Eat more whole-grain foods, fish, poultry, and nuts Remain active and exercise as tolerated daily.  Monitor BP at home-Call if greater than 130/80.  Check CMP/CBC  Gastroesophageal reflux disease, esophagitis presence not specified No suspected reflux complications ([place]/stricture). Lifestyle modification:  wt loss, avoid meals 2-3h before bedtime. Consider eliminating food triggers:  chocolate, caffeine, EtOH, acid/spicy food.   History of prostate cancer S/p prostatectomy 1999; no issues or suspected recurrence.  Wears pad for incontinence   Mixed hyperlipidemia Discussed lifestyle modifications. Recommended diet heavy in fruits and veggies, omega 3's. Decrease consumption of animal meats, cheeses, and dairy products. Remain active and exercise as tolerated. Continue to monitor. Check lipids/TSH   Abnormal glucose Education: Reviewed 'ABCs' of diabetes management  Discussed goals to be met and/or maintained include A1C (<7) Blood pressure (<130/80) Cholesterol (LDL <70) Continue Eye Exam yearly  Continue Dental Exam Q6 mo Discussed dietary recommendations Discussed Physical Activity recommendations Foot exam UTD Check A1C   Vitamin D deficiency Continue supplementation and routine monitoring At goal at recent check; continue to recommend supplementation for goal of 60-100 Defer vitamin D level  History of DVT 6 mo trial of Xarelto, no longer needed. D/c 12/2021. Continue to monitor   History of basal cell carcinoma Continue follow up with derm q6 months or as recommended; wear sunscreen when outdoors and reapply regularly.  Body mass index (BMI) of 22.0-22.9 in  adult Discussed appropriate BMI Diet modification. Physical activity. Encouraged/praised to build confidence.   Former smoker/40+ pack year hx No sx, last cxr 2019, no sx, declines imaging at this time  Memory changes Continue to follow with research studies. Continue on Aricept and follow with Gerontology at [facility] ASA, statin    Hard of hearing Continue hearing aids  Orders Placed This Encounter  Procedures   CBC with Differential/Platelet   COMPLETE METABOLIC PANEL WITH GFR   Lipid panel   VITAMIN D 25 Hydroxy (Vit-D Deficiency, Fractures)   Hemoglobin A1c     Over 20 minutes of face to face interview, exam, counseling, chart review, and critical decision making was performed  Future Appointments  Date Time Provider Amanda  01/12/2023 11:00 AM Unk Pinto, MD [facility] None  04/21/2023 10:30 AM Alycia Rossetti, NP [facility] None    Subjective:  Brian Proctor is a 86 y.o. male who presents alongside wife for a 3 month follow up for HTN, HLD, history prediabetes, and vitamin D Def.   Overall he reports feeling well today.  He follows dermatology and is concerned for possible rosacea outbreak.  He has hx of basal cell skin CA for which he sees dermatology q14mfor monitoring.   S/p prostatectomy for CA in 1999 - has incontinence for which he wears a pad.   He had right lower leg DVT 05/05/21. Tapered Xarelto 12/2021.   He is followed by Dr. YKrista Bluefor mild cognitive impairment and is on Donepezil 10 mg daily. Deferred MMSE as he is also followed by gerontology Dr. ALeamon Arntand was evaluated 08/2021.  He continues to remain in research studies for different medications to help with memory.  He will follow up with a new study next month.  He has reported ongoing gradual  short term memory loss, had MRI in 06/2019 for these reports which showed microvascular changes. Declined namenda/aricept at that time, had basic labs at that time including  b12 which were normal.  He does not follow with Neurology.  GERD well controlled by lifestyle modification; uses alka-seltzer for rare symptoms. He does endorse increased belching but denies other symptoms. He feels well controlled with current regimen.   BMI is Body mass index is 20.81 kg/m., he has been working on diet and exercise.  Wt Readings from Last 3 Encounters:  09/28/22 145 lb (65.8 kg)  06/22/22 145 lb 9.6 oz (66 kg)  05/03/22 148 lb 6.4 oz (67.3 kg)   His blood pressure has been controlled at home, today their BP is BP: 122/64  BP Readings from Last 3 Encounters:  09/28/22 122/64  06/22/22 124/68  05/03/22 100/60    He does workout. He denies chest pain, shortness of breath, dizziness.   He is on cholesterol medication (atorvastatatin 40 mg daily), on omega 3 supplement and lifestyle changes. His cholesterol is at goal. The cholesterol last visit was:  Lab Results  Component Value Date   CHOL 129 06/22/2022   HDL 48 06/22/2022   LDLCALC 63 06/22/2022   TRIG 95 06/22/2022   CHOLHDL 2.7 06/22/2022   He has been working on diet and exercise for history of prediabetes, and denies increased appetite, nausea, paresthesia of the feet, polydipsia, polyuria and visual disturbances. Last A1C in the office was:  Lab Results  Component Value Date   HGBA1C 5.3 06/22/2022   Last GFR Lab Results  Component Value Date   GFRNONAA >60 04/30/2022    Patient is on Vitamin D supplement.   Lab Results  Component Value Date   VD25OH 84 06/22/2022      Medication Review: Current Outpatient Medications on File Prior to Visit  Medication Sig Dispense Refill   aspirin 81 MG tablet Take 81 mg by mouth daily.     atorvastatin (LIPITOR) 40 MG tablet TAKE 1 TABLET BY MOUTH EVERY OTHER DAY FOR CHOLESTEROL 90 tablet 0   cholecalciferol (VITAMIN D3) 25 MCG (1000 UNIT) tablet Take 1,000 Units by mouth in the morning and at bedtime.     CINNAMON PO Take 2,000 mg by mouth daily.      Coenzyme Q10 200 MG TABS Take 200 mg by mouth daily.     donepezil (ARICEPT) 10 MG tablet Take 10 mg by mouth at bedtime.     fish oil-omega-3 fatty acids 1000 MG capsule Take 1,200 mg by mouth daily.     fludrocortisone (FLORINEF) 0.1 MG tablet Take  1 tablet  2 to 3 x /day  to Raise BP 270 tablet 3   Magnesium 250 MG TABS Take 500 mg by mouth daily.     Multiple Vitamins-Minerals (MULTIVITAMIN PO) Take by mouth daily.     triamcinolone ointment (KENALOG) 0.1 % Apply 1 application topically 2 (two) times daily. Apply to Rash 2 to 3 x /day 80 g 3   vitamin C (ASCORBIC ACID) 500 MG tablet Take 1,000 mg by mouth daily.     zinc gluconate 50 MG tablet Take 50 mg by mouth daily.     No current facility-administered medications on file prior to visit.    Allergies: No Known Allergies  Current Problems (verified) has Hyperlipidemia, mixed; Essential hypertension; GERD (gastroesophageal reflux disease); Vitamin D deficiency; Medication management; History of prostate cancer; BMI 22.0-22.9, adult; History of basal cell carcinoma; Former smoker; FH:  heart disease; COPD (chronic obstructive pulmonary disease) ([place]); Abnormal glucose; and Vascular dementia without behavioral disturbance ([place]) on their problem list.  Screening Tests Immunization History  Administered Date(s) Administered   DT (Pediatric) 09/03/2015   Fluad Quad(high Dose 65+) 10/13/2019   Influenza Inj Mdck Quad Pf 11/10/2018   Influenza Split 10/28/2016   Influenza, High Dose Seasonal PF 09/27/2014, 09/03/2015, 10/05/2017, 10/07/2020, 10/21/2021   Influenza,inj,Quad PF,6+ Mos 10/11/2018   PFIZER Comirnaty(Gray Top)Covid-19 Tri-Sucrose Vaccine 05/22/2021   PFIZER(Purple Top)SARS-COV-2 Vaccination 01/17/2020, 02/07/2020, 09/23/2020   Pfizer Covid-19 Vaccine Bivalent Booster 22yr & up 10/21/2021   Pneumococcal Conjugate-13 10/16/2014   Pneumococcal Polysaccharide-23 03/16/2022   Pneumococcal-Unspecified 02/03/2011   Td  07/26/2005, 08/07/2018   Zoster, Live 12/27/2006   Health Maintenance  Topic Date Due   Zoster Vaccines- Shingrix (1 of 2) Never done   COVID-19 Vaccine (6 - Pfizer risk series) 12/16/2021   INFLUENZA VACCINE  07/27/2022   TETANUS/TDAP  08/07/2028   Pneumonia Vaccine 86 Years old  Completed   HPV VACCINES  Aged Out    Names of Other Physician/Practitioners you currently use: 1. [facility] here for primary care 2. [facility], eye doctor, last visit 2022?, wears glasses, no issues 3. [facility], dentist, last visit 2022 - goes q6 months  Patient Care Team: MUnk Pinto MD as PCP - General (Internal Medicine) ANew[facility] Pigg [facility] (Pharmacist)  Surgical: He  has a past surgical history that includes Tonsillectomy; Prostate surgery (1999 or 2000); Colonoscopy; and Inguinal hernia repair (01/15/2013). Family His family history includes Autism in his son; COPD in his father; Heart disease in his father; Other in his mother. Social history  He reports that he quit smoking about 23 years ago. His smoking use included cigarettes. He has a 40.00 pack-year smoking history. He has never used smokeless tobacco. He reports that he does not drink alcohol and does not use drugs.    Objective:   Today's Vitals   09/28/22 1439  BP: 122/64  Pulse: (!) 56  Temp: 97.7 F (36.5 C)  SpO2: 99%  Weight: 145 lb (65.8 kg)  Height: 5' 10" (1.778 m)    Body mass index is 20.81 kg/m.  General appearance: alert, no distress, WD/WN, male HEENT: normocephalic, sclerae anicteric, TMs pearly, nares patent, no discharge or erythema, pharynx normal Oral cavity: MMM, no lesions Neck: supple, no lymphadenopathy, no thyromegaly, no masses Heart: RRR, normal S1, S2, no murmurs Lungs: CTA bilaterally, no wheezes, rhonchi, or rales Abdomen: +bs, soft, non tender, non distended, no masses, no hepatomegaly, no splenomegaly Musculoskeletal:  nontender, no swelling, no obvious deformity Extremities: no edema, no cyanosis, no clubbing Pulses: 2+ symmetric, upper and lower extremities, normal cap refill Neurological: alert, oriented x 3, CN2-12 intact, strength normal upper extremities and lower extremities, sensation normal throughout, DTRs 2+ throughout, no cerebellar signs, gait normal. Poor short term recall.  Psychiatric: normal affect, behavior normal, pleasant  Skin: warm/dry, intact, redness to bilateral cheeks, scattered dryness over scalp, chin and forearms.   TDarrol Jump NP   09/28/2022

## 2022-09-29 LAB — COMPLETE METABOLIC PANEL WITH GFR
AG Ratio: 1.7 (calc) (ref 1.0–2.5)
ALT: 23 U/L (ref 9–46)
AST: 27 U/L (ref 10–35)
Albumin: 4.2 g/dL (ref 3.6–5.1)
Alkaline phosphatase (APISO): 84 U/L (ref 35–144)
BUN: 18 mg/dL (ref 7–25)
CO2: 33 mmol/L — ABNORMAL HIGH (ref 20–32)
Calcium: 9.4 mg/dL (ref 8.6–10.3)
Chloride: 107 mmol/L (ref 98–110)
Creat: 1.09 mg/dL (ref 0.70–1.22)
Globulin: 2.5 g/dL (calc) (ref 1.9–3.7)
Glucose, Bld: 64 mg/dL — ABNORMAL LOW (ref 65–99)
Potassium: 4.7 mmol/L (ref 3.5–5.3)
Sodium: 146 mmol/L (ref 135–146)
Total Bilirubin: 0.5 mg/dL (ref 0.2–1.2)
Total Protein: 6.7 g/dL (ref 6.1–8.1)
eGFR: 67 mL/min/{1.73_m2} (ref >=60)

## 2022-09-29 LAB — LIPID PANEL
Cholesterol: 132 mg/dL (ref <200)
HDL: 48 mg/dL (ref >=40)
LDL Cholesterol (Calc): 66 mg/dL (calc)
Non-HDL Cholesterol (Calc): 84 mg/dL (calc) (ref <130)
Total CHOL/HDL Ratio: 2.8 (calc) (ref <5.0)
Triglycerides: 96 mg/dL (ref <150)

## 2022-09-29 LAB — HEMOGLOBIN A1C
Hgb A1c MFr Bld: 5.2 % of total Hgb (ref <5.7)
Mean Plasma Glucose: 103 mg/dL
eAG (mmol/L): 5.7 mmol/L

## 2022-09-29 LAB — CBC WITH DIFFERENTIAL/PLATELET
Absolute Monocytes: 655 cells/uL (ref 200–950)
Basophils Absolute: 60 cells/uL (ref 0–200)
Basophils Relative: 0.7 %
Eosinophils Absolute: 323 cells/uL (ref 15–500)
Eosinophils Relative: 3.8 %
HCT: 43.8 % (ref 38.5–50.0)
Hemoglobin: 14.6 g/dL (ref 13.2–17.1)
Lymphs Abs: 1853 cells/uL (ref 850–3900)
MCH: 30.6 pg (ref 27.0–33.0)
MCHC: 33.3 g/dL (ref 32.0–36.0)
MCV: 91.8 fL (ref 80.0–100.0)
MPV: 10.6 fL (ref 7.5–12.5)
Monocytes Relative: 7.7 %
Neutro Abs: 5610 cells/uL (ref 1500–7800)
Neutrophils Relative %: 66 %
Platelets: 228 10*3/uL (ref 140–400)
RBC: 4.77 10*6/uL (ref 4.20–5.80)
RDW: 13 % (ref 11.0–15.0)
Total Lymphocyte: 21.8 %
WBC: 8.5 10*3/uL (ref 3.8–10.8)

## 2022-09-29 LAB — VITAMIN D 25 HYDROXY (VIT D DEFICIENCY, FRACTURES): Vit D, 25-Hydroxy: 63 ng/mL (ref 30–100)

## 2022-10-05 DIAGNOSIS — Z23 Encounter for immunization: Secondary | ICD-10-CM | POA: Diagnosis not present

## 2022-10-18 DIAGNOSIS — L570 Actinic keratosis: Secondary | ICD-10-CM | POA: Diagnosis not present

## 2022-10-18 DIAGNOSIS — L821 Other seborrheic keratosis: Secondary | ICD-10-CM | POA: Diagnosis not present

## 2022-11-22 DIAGNOSIS — Z79899 Other long term (current) drug therapy: Secondary | ICD-10-CM | POA: Diagnosis not present

## 2022-11-22 DIAGNOSIS — G309 Alzheimer's disease, unspecified: Secondary | ICD-10-CM | POA: Diagnosis not present

## 2022-11-22 DIAGNOSIS — F02B0 Dementia in other diseases classified elsewhere, moderate, without behavioral disturbance, psychotic disturbance, mood disturbance, and anxiety: Secondary | ICD-10-CM | POA: Diagnosis not present

## 2022-11-22 DIAGNOSIS — F01B0 Vascular dementia, moderate, without behavioral disturbance, psychotic disturbance, mood disturbance, and anxiety: Secondary | ICD-10-CM | POA: Diagnosis not present

## 2022-11-22 DIAGNOSIS — F0150 Vascular dementia without behavioral disturbance: Secondary | ICD-10-CM | POA: Diagnosis not present

## 2022-11-22 DIAGNOSIS — F0280 Dementia in other diseases classified elsewhere without behavioral disturbance: Secondary | ICD-10-CM | POA: Diagnosis not present

## 2022-12-15 ENCOUNTER — Encounter: Payer: Medicare Other | Admitting: Internal Medicine

## 2022-12-22 ENCOUNTER — Encounter: Payer: Medicare Other | Admitting: Internal Medicine

## 2023-01-04 ENCOUNTER — Ambulatory Visit: Payer: Medicare Other | Admitting: Nurse Practitioner

## 2023-01-04 ENCOUNTER — Encounter: Payer: Self-pay | Admitting: Nurse Practitioner

## 2023-01-04 ENCOUNTER — Other Ambulatory Visit: Payer: Self-pay

## 2023-01-04 VITALS — HR 76 | Temp 98.0°F | Ht 70.0 in

## 2023-01-04 DIAGNOSIS — F0150 Vascular dementia without behavioral disturbance: Secondary | ICD-10-CM

## 2023-01-04 DIAGNOSIS — U071 COVID-19: Secondary | ICD-10-CM | POA: Diagnosis not present

## 2023-01-04 DIAGNOSIS — G4483 Primary cough headache: Secondary | ICD-10-CM | POA: Diagnosis not present

## 2023-01-04 LAB — POC INFLUENZA A&B (BINAX/QUICKVUE)
Influenza A, POC: NEGATIVE
Influenza B, POC: NEGATIVE

## 2023-01-04 LAB — POC COVID19 BINAXNOW: SARS Coronavirus 2 Ag: POSITIVE — AB

## 2023-01-04 MED ORDER — BENZONATATE 100 MG PO CAPS: 100.0000 mg | ORAL_CAPSULE | Freq: Four times a day (QID) | ORAL | 1 refills | Status: DC | PRN

## 2023-01-04 MED ORDER — DEXAMETHASONE 1 MG PO TABS: ORAL_TABLET | ORAL | 0 refills | Status: DC

## 2023-01-04 MED ORDER — AZITHROMYCIN 250 MG PO TABS: ORAL_TABLET | ORAL | 1 refills | Status: DC

## 2023-01-04 NOTE — Progress Notes (Signed)
THIS ENCOUNTER IS A VIRTUAL VISIT DUE TO COVID-19 - PATIENT WAS NOT SEEN IN THE OFFICE.  PATIENT HAS CONSENTED TO VIRTUAL VISIT / TELEMEDICINE VISIT   Virtual Visit via telephone Note  I connected with  Marsh Dolly on 01/04/2023 by telephone.  I verified that I am speaking with the correct person using two identifiers.    I discussed the limitations of evaluation and management by telemedicine and the availability of in person appointments. The patient expressed understanding and agreed to proceed.  History of Present Illness:  85   Temp 98 F (36.7 C)   SpO2 98%  87 y.o. patient contacted office reporting URI sx . he tested positive by test in office parking lot today. OV was conducted by telephone to minimize exposure. This patient was vaccinated for covid 19, last 10/21/21   Pt does have vascular dementia- all history and information is provided by wife  Sx began 2 days ago with fatigue, productive cough , head congestion, headache, decreased appetite  Treatments tried so far: none  Exposures: uncertain   Medications  Current Outpatient Medications (Endocrine & Metabolic):    fludrocortisone (FLORINEF) 0.1 MG tablet, Take  1 tablet  2 to 3 x /day  to Raise BP  Current Outpatient Medications (Cardiovascular):    atorvastatin (LIPITOR) 40 MG tablet, TAKE 1 TABLET BY MOUTH EVERY OTHER DAY FOR CHOLESTEROL   Current Outpatient Medications (Analgesics):    aspirin 81 MG tablet, Take 81 mg by mouth daily.   Current Outpatient Medications (Other):    cholecalciferol (VITAMIN D3) 25 MCG (1000 UNIT) tablet, Take 1,000 Units by mouth in the morning and at bedtime.   CINNAMON PO, Take 2,000 mg by mouth daily.   Coenzyme Q10 200 MG TABS, Take 200 mg by mouth daily.   donepezil (ARICEPT) 10 MG tablet, Take 10 mg by mouth at bedtime.   fish oil-omega-3 fatty acids 1000 MG capsule, Take 1,200 mg by mouth daily.   Magnesium 250 MG TABS, Take 500 mg by mouth daily.   Multiple  Vitamins-Minerals (MULTIVITAMIN PO), Take by mouth daily.   triamcinolone ointment (KENALOG) 0.1 %, Apply 1 application topically 2 (two) times daily. Apply to Rash 2 to 3 x /day   vitamin C (ASCORBIC ACID) 500 MG tablet, Take 1,000 mg by mouth daily.   zinc gluconate 50 MG tablet, Take 50 mg by mouth daily.  Allergies: No Known Allergies  Problem list He has Hyperlipidemia, mixed; Essential hypertension; GERD (gastroesophageal reflux disease); Vitamin D deficiency; Medication management; History of prostate cancer; BMI 22.0-22.9, adult; History of basal cell carcinoma; Former smoker; FH: heart disease; COPD (chronic obstructive pulmonary disease) ([place]); Abnormal glucose; and Vascular dementia without behavioral disturbance ([place]) on their problem list.   Social History:   reports that he quit smoking about 24 years ago. His smoking use included cigarettes. He has a 40.00 pack-year smoking history. He has never used smokeless tobacco. He reports that he does not drink alcohol and does not use drugs.  Observations/Objective:  General : Well sounding patient in no apparent distress HEENT: no hoarseness, no cough for duration of visit Lungs: speaks in complete sentences, no audible wheezing, no apparent distress Neurological: alert, oriented x 3 Psychiatric: pleasant, judgement appropriate   Assessment and Plan:  Covid 19 Covid 19 positive per rapid screening test in office parking lot today Risk factors include: has Hyperlipidemia, mixed; Essential hypertension; GERD (gastroesophageal reflux disease); Vitamin D deficiency; Medication management; History of prostate cancer; BMI  22.0-22.9, adult; History of basal cell carcinoma; Former smoker; FH: heart disease; COPD (chronic obstructive pulmonary disease) ([place]); Abnormal glucose; and Vascular dementia without behavioral disturbance (H[facility]) on their problem list.  Symptoms are: mild Immue support reviewed - Vit D, Vit C and Zinc Take tylenol PRN  temp 101+ Push hydration Regular ambulation or calf exercises exercises for clot prevention and 81 mg ASA unless contraindicated Sx supportive therapy suggested Follow up via mychart or telephone if needed Advised patient obtain O2 monitor; present to ED if persistently <90% or with severe dyspnea, CP, fever uncontrolled by tylenol, confusion, sudden decline Should remain in isolation 5 days from testing positive and then wear a mask when around other people for the following 5 days    Cough headache -     POC Influenza A&B (Binax test)- negative -     POC COVID-19- positive  COVID -     dexamethasone (DECADRON) 1 MG tablet; Take 3 tabs for 3 days, 2 tabs for 3 days 1 tab for 5 days. Take with food. -     azithromycin (ZITHROMAX) 250 MG tablet; Take 2 tablets (500 mg) on  Day 1,  followed by 1 tablet (250 mg) once daily on Days 2 through 5. -     benzonatate (TESSALON PERLES) 100 MG capsule; Take 1 capsule (100 mg total) by mouth every 6 (six) hours as needed for cough.  Vascular dementia without behavioral disturbance (HCC) Continue medications    Follow Up Instructions:  I discussed the assessment and treatment plan with the patient. The patient was provided an opportunity to ask questions and all were answered. The patient agreed with the plan and demonstrated an understanding of the instructions.   The patient was advised to call back or seek an in-person evaluation if the symptoms worsen or if the condition fails to improve as anticipated.  I provided 20 minutes of non-face-to-face time during this encounter.   Alycia Rossetti, NP

## 2023-01-04 NOTE — Patient Instructions (Addendum)
Covid Immue support reviewed - Vit D, Vit C and Zinc Take tylenol PRN temp 101+ Push hydration Regular ambulation or calf exercises exercises for clot prevention and 81 mg ASA unless contraindicated Sx supportive therapy suggested Follow up via mychart or telephone if needed Advised patient obtain O2 monitor; present to ED if persistently <90% or with severe dyspnea, CP, fever uncontrolled by tylenol, confusion, sudden decline Should remain in isolation 5 days from testing positive and then wear a mask when around other people for the following 5 days

## 2023-01-12 ENCOUNTER — Encounter: Payer: Medicare Other | Admitting: Internal Medicine

## 2023-01-25 ENCOUNTER — Encounter: Payer: Self-pay | Admitting: Internal Medicine

## 2023-01-25 ENCOUNTER — Ambulatory Visit (INDEPENDENT_AMBULATORY_CARE_PROVIDER_SITE_OTHER): Payer: Medicare Other | Admitting: Internal Medicine

## 2023-01-25 VITALS — BP 136/78 | HR 67 | Temp 97.7°F | Resp 16 | Ht 69.0 in | Wt 145.4 lb

## 2023-01-25 DIAGNOSIS — I951 Orthostatic hypotension: Secondary | ICD-10-CM | POA: Diagnosis not present

## 2023-01-25 DIAGNOSIS — Z125 Encounter for screening for malignant neoplasm of prostate: Secondary | ICD-10-CM | POA: Diagnosis not present

## 2023-01-25 DIAGNOSIS — F0150 Vascular dementia without behavioral disturbance: Secondary | ICD-10-CM | POA: Diagnosis not present

## 2023-01-25 DIAGNOSIS — Z8546 Personal history of malignant neoplasm of prostate: Secondary | ICD-10-CM | POA: Diagnosis not present

## 2023-01-25 DIAGNOSIS — E782 Mixed hyperlipidemia: Secondary | ICD-10-CM

## 2023-01-25 DIAGNOSIS — Z87891 Personal history of nicotine dependence: Secondary | ICD-10-CM

## 2023-01-25 DIAGNOSIS — Z136 Encounter for screening for cardiovascular disorders: Secondary | ICD-10-CM | POA: Diagnosis not present

## 2023-01-25 DIAGNOSIS — Z1211 Encounter for screening for malignant neoplasm of colon: Secondary | ICD-10-CM

## 2023-01-25 DIAGNOSIS — E559 Vitamin D deficiency, unspecified: Secondary | ICD-10-CM

## 2023-01-25 DIAGNOSIS — Z79899 Other long term (current) drug therapy: Secondary | ICD-10-CM

## 2023-01-25 DIAGNOSIS — Z8249 Family history of ischemic heart disease and other diseases of the circulatory system: Secondary | ICD-10-CM

## 2023-01-25 DIAGNOSIS — R7309 Other abnormal glucose: Secondary | ICD-10-CM | POA: Diagnosis not present

## 2023-01-25 NOTE — Progress Notes (Signed)
Comprehensive Evaluation & Examination   Future Appointments  Date Time Provider Department  01/25/2023  2:00 PM Unk Pinto, MD [facility]  04/21/2023           wellness 10:30 AM Alycia Rossetti, NP [facility]  01/31/2024  2:00 PM Unk Pinto, MD [facility]         This very nice 87 y.o. [name] with  orthostatic hypotension, HLD, Prediabetes and Vitamin D Deficiency  presents for a comprehensive evaluation and management of multiple medical co-morbidities.   In  2021 , patient was started on Alliance by Dr Krista Blue  for "mild cognitive impairment".  In 2000, patient had a Suprapubic Prostatectomy by Dr Risa Grill. In May 2022, he was dx'd with a RLE  DVT and prescribed Xarelto.          HTN predates circa  2005 .  More recently in May 2023, patient had developed orthostatic hypotension & was started on Florinef.   Patient's BP has been controlled at home.  Today's BP is at goal 136/78 . Patient denies any cardiac symptoms as chest pain, palpitations, shortness of breath, dizziness or ankle swelling.       Patient's hyperlipidemia is controlled with diet and Atorvastatin .  Patient denies myalgias or other medication SE's. Last lipids were at goal :  Lab Results  Component Value Date   CHOL 132 09/28/2022   HDL 48 09/28/2022   LDLCALC 66 09/28/2022   TRIG 96 09/28/2022   CHOLHDL 2.8 09/28/2022         Patient has hx/o prediabetes  (A1c 5.7% /2013 & 5.3% /2014) and patient denies reactive hypoglycemic symptoms, visual blurring, diabetic polys or paresthesias. Last A1c was normal & at goal :   Lab Results  Component Value Date   HGBA1C 5.2 09/28/2022         Finally, patient has history of Vitamin D Deficiency ("41" /2013) and last vitamin D was at goal :   Lab Results  Component Value Date   VD25OH 63 09/28/2022       Current Outpatient Medications  Medication Instructions   ascorbic acid (VITAMIN C) 1,000 mg, Oral, Daily   aspirin 81 mg, Oral, Daily    atorvastatin (LIPITOR) 40 MG tablet TAKE 1 TABLET EVERY OTHER DAY    cholecalciferol (VITAMIN D3) 1,000 Units, Oral, 2 times daily   CINNAMON PO 2,000 mg, Oral, Daily   Coenzyme Q10 200 mg, Oral, Daily   donepezil (ARICEPT) 10 mg, Oral, Daily at bedtime   fish oil-omega-3 fatty acids 1,200 mg, Oral, Daily   fludrocortisone (FLORINEF) 0.1 MG tablet Take  1 tablet  2 to 3 x /day  to Raise BP   Magnesium 500 mg, Oral, Daily   Multiple Vitamins-Minerals (MULTIVITAMIN PO) Daily   triamcinolone ointment (KENALOG) 0.1 % Apply to Rash 2 to 3 x /day   zinc gluconate 50 mg, Oral, Daily     No Known Allergies   Past Medical History:  Diagnosis Date   Cancer ([place])    prostate - basal cell   GERD (gastroesophageal reflux disease)    Hearing loss    wear hearing aid   Hyperlipidemia    Hypertension    Inguinal hernia    Memory loss    Prediabetes    Vitamin D deficiency    Wears glasses    Wears hearing aid    both ears     Health Maintenance  Topic Date Due   Zoster  Vaccines- Shingrix (1 of 2) Never done   Pneumonia Vaccine 81+ Years old (2 - PPSV23 if available, else PCV20) 10/17/2015   COVID-19 Vaccine (3 - Pfizer risk series) 03/06/2020   INFLUENZA VACCINE  07/27/2021   TETANUS/TDAP  08/07/2028   HPV VACCINES  Aged Out     Immunization History  Administered Date(s) Administered   DT 09/03/2015   Fluad Quad(high Dose) 10/13/2019   Influenza Inj Mdck Quad Pf 11/10/2018   Influenza Split 10/28/2016   Influenza, High Dose  09/27/2014, 09/03/2015, 10/05/2017   Influenza,inj,Quad PF,6+ Mos 10/11/2018   PFIZER SARS-COV-2 Vacc 01/17/2020, 02/07/2020   Pneumococcal -13 10/16/2014   Pneumococcal-23 02/03/2011   Td 07/26/2005, 08/07/2018   Zoster, Live 12/27/2006    Last Colon - 04/2012 - Dr Arty Baumgartner - recc no f/u due to age   Past Surgical History:  Procedure Laterality Date   COLONOSCOPY     x2   INGUINAL HERNIA REPAIR  01/15/2013   Procedure: HERNIA REPAIR  INGUINAL ADULT;  Surgeon: Gwenyth Ober, MD;  Location: [facility];  Service: General;  Laterality: Right;   [place] or 2000   due to cancer   TONSILLECTOMY     as a child     Family History  Problem Relation Age of Onset   Other Mother        bowel infarction   COPD Father    Heart disease Father    Autism Son      Social History   Socioeconomic History   Marital status: Married      Spouse name: Vaughan Basta   Number of children: 2 sons and 1 with Autism who lives in a group home   Years of education: college  Occupational History   Occupation: Retired [employer]    Tobacco Use   Smoking status: Former    Packs/day: 1.00    Years: 40.00    Pack years: 40.00    Types: Cigarettes    Quit date: 01/10/1999    Years since quitting: 22.9   Smokeless tobacco: Never  Substance Use Topics   Alcohol use: No    Comment: occasional   Drug use: No      ROS Constitutional: Denies fever, chills, weight loss/gain, headaches, insomnia,  night sweats or change in appetite. Does c/o fatigue. Eyes: Denies redness, blurred vision, diplopia, discharge, itchy or watery eyes.  ENT: Denies discharge, congestion, post nasal drip, epistaxis, sore throat, earache, hearing loss, dental pain, Tinnitus, Vertigo, Sinus pain or snoring.  Cardio: Denies chest pain, palpitations, irregular heartbeat, syncope, dyspnea, diaphoresis, orthopnea, PND, claudication or edema Respiratory: denies cough, dyspnea, DOE, pleurisy, hoarseness, laryngitis or wheezing.  Gastrointestinal: Denies dysphagia, heartburn, reflux, water brash, pain, cramps, nausea, vomiting, bloating, diarrhea, constipation, hematemesis, melena, hematochezia, jaundice or hemorrhoids Genitourinary: Denies dysuria, frequency, urgency, nocturia, hesitancy, discharge, hematuria or flank pain Musculoskeletal: Denies arthralgia, myalgia, stiffness, Jt. Swelling, pain, limp or  strain/sprain. Denies Falls. Skin: Denies puritis, rash, hives, warts, acne, eczema or change in skin lesion Neuro: No weakness, tremor, incoordination, spasms, paresthesia or pain Psychiatric: Denies confusion, memory loss or sensory loss. Denies Depression. Endocrine: Denies change in weight, skin, hair change, nocturia, and paresthesia, diabetic polys, visual blurring or hyper / hypo glycemic episodes.  Heme/Lymph: No excessive bleeding, bruising or enlarged lymph nodes.   Physical Exam  BP 136/78   Pulse 67   Temp 97.7 F (36.5 C)   Resp 16  Ht '5\' 9"'$  (1.753 m)   Wt 145 lb 6.4 oz (66 kg)   SpO2 97%   BMI 21.47 kg/m   General Appearance: Well nourished and well groomed and in no apparent distress.  Eyes: PERRLA, EOMs, conjunctiva no swelling or erythema, normal fundi and vessels. Sinuses: No frontal/maxillary tenderness ENT/Mouth: EACs patent / TMs  nl. Nares clear without erythema, swelling, mucoid exudates. Oral hygiene is good. No erythema, swelling, or exudate. Tongue normal, non-obstructing. Tonsils not swollen or erythematous. Hearing normal.  Neck: Supple, thyroid not palpable. No bruits, nodes or JVD. Respiratory: Respiratory effort normal.  BS equal and clear bilateral without rales, rhonci, wheezing or stridor. Cardio: Heart sounds are normal with regular rate and rhythm and no murmurs, rubs or gallops. Peripheral pulses are normal and equal bilaterally without edema. No aortic or femoral bruits. Chest: symmetric with normal excursions and percussion.  Abdomen: Soft, with Nl bowel sounds. Nontender, no guarding, rebound, hernias, masses, or organomegaly.  Lymphatics: Non tender without lymphadenopathy.  Musculoskeletal: Full ROM all peripheral extremities, joint stability, 5/5 strength, and normal gait. Skin: Warm and dry without rashes, lesions, cyanosis, clubbing or  ecchymosis.  Neuro: Cranial nerves intact, reflexes equal bilaterally. Normal muscle tone, no  cerebellar symptoms. Sensation intact.  Pysch: Alert and oriented x 3 with normal affect, insight and judgment appropriate.   Assessment and Plan   1. Labile hypertension  - EKG 12-Lead - Urinalysis, Routine w reflex microscopic - Microalbumin / creatinine urine ratio - CBC with Differential/Platelet - COMPLETE METABOLIC PANEL WITH GFR - Magnesium - TSH  2. Hyperlipidemia, mixed  - EKG 12-Lead - Lipid panel - TSH  3. Abnormal glucose  - EKG 12-Lead - Hemoglobin A1c - Insulin, random  4. Vitamin D deficiency  - VITAMIN D 25 Hydroxy (  5. Vascular dementia without behavioral disturbance ([facility]CC)  - Lipid panel  6. Acute deep vein thrombosis (DVT) of iliac vein of right lower extremity (HCC)  - now completed 6 + months on Xarelto - discussed taper off over the next month.   7. History of prostate cancer  - PSA  8. BPH with obstruction/lower urinary tract symptoms  - PSA  9. Screening for colorectal cancer  - POC Hemoccult Bld/Stl   10. Screening for ischemic heart disease  - EKG 12-Lead  11. FHx: heart disease  - EKG 12-Lead  12. Former smoker  - EKG 12-Lead  13. Screening for AAA (aortic abdominal aneurysm)   14. Medication management  - Urinalysis, Routine w reflex microscopic - Microalbumin / creatinine urine ratio - CBC with Differential/Platelet - COMPLETE METABOLIC PANEL WITH GFR - Magnesium - Lipid panel - TSH - Hemoglobin A1c - Insulin, random - VITAMIN D 25 Hydroxy          Patient was counseled in prudent diet, weight control to achieve/maintain BMI less than 25, BP monitoring, regular exercise and medications as discussed.  Discussed med effects and SE's. Routine screening labs and tests as requested with regular follow-up as recommended. Over 40 minutes of exam, counseling, chart review and high complex critical decision making was performed   Kirtland Bouchard, MD

## 2023-01-25 NOTE — Patient Instructions (Signed)
Due to recent changes in healthcare laws, you may see the results of your imaging and laboratory studies on MyChart before your provider has had a chance to review them.  We understand that in some cases there may be results that are confusing or concerning to you. Not all laboratory results come back in the same time frame and the provider may be waiting for multiple results in order to interpret others.  Please give [place] 48 hours in order for your provider to thoroughly review all the results before contacting the office for clarification of your results.   +++++++++++++++++++++++++++++++  Vit D  & Vit C 1,000 mg   are recommended to help protect  against the Covid-19 and other Corona viruses.    Also it's recommended  to take  Zinc 50 mg  to help  protect against the Covid-19   and best place to get  is also on [email or web address]  and don't pay more than 6-8 cents /pill !  ================================ Coronavirus (COVID-19) Are you at risk?  Are you at risk for the Coronavirus (COVID-19)?  To be considered HIGH RISK for Coronavirus (COVID-19), you have to meet the following criteria:  Traveled to [place], [place], [place], [place] or [place]; or in the [place] to [place], [place], [place]  or [place]; and have fever, cough, and shortness of breath within the last 2 weeks of travel OR Been in close contact with a person diagnosed with COVID-19 within the last 2 weeks and have  fever, cough,and shortness of breath  IF YOU DO NOT MEET THESE CRITERIA, YOU ARE CONSIDERED LOW RISK FOR COVID-19.  What to do if you are HIGH RISK for COVID-19?  If you are having a medical emergency, call 911. Seek medical care right away. Before you go to a doctor's office, urgent care or emergency department,  call ahead and tell them about your recent travel, contact with someone diagnosed with COVID-19   and your symptoms.  You should receive instructions from your physician's office regarding  next steps of care.  When you arrive at healthcare provider, tell the healthcare staff immediately you have returned from  visiting [place], [place], [place], [place] or [place]; or traveled in the [place] to [place], [place],  [place] or [place] in the last two weeks or you have been in close contact with a person diagnosed with  COVID-19 in the last 2 weeks.   Tell the health care staff about your symptoms: fever, cough and shortness of breath. After you have been seen by a medical provider, you will be either: Tested for (COVID-19) and discharged home on quarantine except to seek medical care if  symptoms worsen, and asked to  Stay home and avoid contact with others until you get your results (4-5 days)  Avoid travel on public transportation if possible (such as bus, train, or airplane) or Sent to the Emergency Department by EMS for evaluation, COVID-19 testing  and  possible admission depending on your condition and test results.  What to do if you are LOW RISK for COVID-19?  Reduce your risk of any infection by using the same precautions used for avoiding the common cold or flu:  Wash your hands often with soap and warm water for at least 20 seconds.  If soap and water are not readily available,  use an alcohol-based hand sanitizer with at least 60% alcohol.  If coughing or sneezing, cover your mouth and nose by coughing  or sneezing into the elbow areas of your shirt or coat,  into a tissue or into your sleeve (not your hands). Avoid shaking hands with others and consider head nods or verbal greetings only. Avoid touching your eyes, nose, or mouth with unwashed hands.  Avoid close contact with people who are sick. Avoid places or events with large numbers of people in one location, like concerts or sporting events. Carefully consider travel plans you have or are making. If you are planning any travel outside or inside the [place], visit the CDC's Travelers' Health webpage for  the latest health notices. If you have some symptoms but not all symptoms, continue to monitor at home and seek medical attention  if your symptoms worsen. If you are having a medical emergency, call 911. >>>>>>>>>>>>>>>>>>>>>>>>>>>>>>>>>>>>>>>>>>>>>>>>>>> We Do NOT Approve of [employer] > > > > > > > > > > > > > > > > > > > > > > > > > > > > > > > > > > >  > >    Preventive Care for Adults  A healthy lifestyle and preventive care can promote health and wellness. Preventive health guidelines for men include the following key practices: A routine yearly physical is a good way to check with your health care provider about your health and preventative screening. It is a chance to share any concerns and updates on your health and to receive a thorough exam. Visit your dentist for a routine exam and preventative care every 6 months. Brush your teeth twice a day and floss once a day. Good oral hygiene prevents tooth decay and gum disease. The frequency of eye exams is based on your age, health, family medical history, use of contact lenses, and other factors. Follow your health care provider's recommendations for frequency of eye exams. Eat a healthy diet. Foods such as vegetables, fruits, whole grains, low-fat dairy products, and lean protein foods contain the nutrients you need without too many calories. Decrease your intake of foods high in solid fats, added sugars, and salt. Eat the right amount of calories for you. Get information about a proper diet from your health care provider, if necessary. Regular physical exercise is one of the most important things you can do for your health. Most adults should get at least 150 minutes of moderate-intensity exercise (any activity that increases your heart rate and causes you to sweat) each week. In addition, most adults need muscle-strengthening exercises on 2 or more days a week. Maintain a healthy weight. The body mass index (BMI) is a screening  tool to identify possible weight problems. It provides an estimate of body fat based on height and weight. Your health care provider can find your BMI and can help you achieve or maintain a healthy weight. For adults 20 years and older: A BMI below 18.5 is considered underweight. A BMI of 18.5 to 24.9 is normal. A BMI of 25 to 29.9 is considered overweight. A BMI of 30 and above is considered obese. Maintain normal blood lipids and cholesterol levels by exercising and minimizing your intake of saturated fat. Eat a balanced diet with plenty of fruit and vegetables. Blood tests for lipids and cholesterol should begin at age 58 and be repeated every 5 years. If your lipid or cholesterol levels are high, you are over 50, or you are at high risk for heart disease, you may need your cholesterol levels checked more frequently. Ongoing high lipid and cholesterol levels should be  treated with medicines if diet and exercise are not working. If you smoke, find out from your health care provider how to quit. If you do not use tobacco, do not start. Lung cancer screening is recommended for adults aged 47-80 years who are at high risk for developing lung cancer because of a history of smoking. A yearly low-dose CT scan of the lungs is recommended for people who have at least a 30-pack-year history of smoking and are a current smoker or have quit within the past 15 years. A pack year of smoking is smoking an average of 1 pack of cigarettes a day for 1 year (for example: 1 pack a day for 30 years or 2 packs a day for 15 years). Yearly screening should continue until the smoker has stopped smoking for at least 15 years. Yearly screening should be stopped for people who develop a health problem that would prevent them from having lung cancer treatment. If you choose to drink alcohol, do not have more than 2 drinks per day. One drink is considered to be 12 ounces (355 mL) of beer, 5 ounces (148 mL) of wine, or 1.5 ounces (44  mL) of liquor. Avoid use of street drugs. Do not share needles with anyone. Ask for help if you need support or instructions about stopping the use of drugs. High blood pressure causes heart disease and increases the risk of stroke. Your blood pressure should be checked at least every 1-2 years. Ongoing high blood pressure should be treated with medicines, if weight loss and exercise are not effective. If you are 33-66 years old, ask your health care provider if you should take aspirin to prevent heart disease. Diabetes screening involves taking a blood sample to check your fasting blood sugar level. Testing should be considered at a younger age or be carried out more frequently if you are overweight and have at least 1 risk factor for diabetes. Colorectal cancer can be detected and often prevented. Most routine colorectal cancer screening begins at the age of 21 and continues through age 56. However, your health care provider may recommend screening at an earlier age if you have risk factors for colon cancer. On a yearly basis, your health care provider may provide home test kits to check for hidden blood in the stool. Use of a small camera at the end of a tube to directly examine the colon (sigmoidoscopy or colonoscopy) can detect the earliest forms of colorectal cancer. Talk to your health care provider about this at age 47, when routine screening begins. Direct exam of the colon should be repeated every 5-10 years through age 10, unless early forms of precancerous polyps or small growths are found. Hepatitis C blood testing is recommended for all people born from [birth year] through 1963 and any individual with known risks for hepatitis C. Screening for abdominal aortic aneurysm (AAA)  by ultrasound is recommended for people who have history of high blood pressure or who are current or former smokers. Healthy men should  receive prostate-specific antigen (PSA) blood tests as part of routine cancer screening.  Talk with your health care provider about prostate cancer screening. Testicular cancer screening is  recommended for adult males. Screening includes self-exam, a health care provider exam, and other screening tests. Consult with your health care provider about any symptoms you have or any concerns you have about testicular cancer. Use sunscreen. Apply sunscreen liberally and repeatedly throughout the day. You should seek shade when your shadow is shorter than  you. Protect yourself by wearing long sleeves, pants, a wide-brimmed hat, and sunglasses year round, whenever you are outdoors. Once a month, do a whole-body skin exam, using a mirror to look at the skin on your back. Tell your health care provider about new moles, moles that have irregular borders, moles that are larger than a pencil eraser, or moles that have changed in shape or color. Stay current with required vaccines (immunizations). Influenza vaccine. All adults should be immunized every year. Tetanus, diphtheria, and acellular pertussis (Td, Tdap) vaccine. An adult who has not previously received Tdap or who does not know his vaccine status should receive 1 dose of Tdap. This initial dose should be followed by tetanus and diphtheria toxoids (Td) booster doses every 10 years. Adults with an unknown or incomplete history of completing a 3-dose immunization series with Td-containing vaccines should begin or complete a primary immunization series including a Tdap dose. Adults should receive a Td booster every 10 years. Zoster vaccine. One dose is recommended for adults aged 60 years or older unless certain conditions are present.  PREVNAR - Pneumococcal 13-valent conjugate (PCV13) vaccine. When indicated, a person who is uncertain of his immunization history and has no record of immunization should receive the PCV13 vaccine. An adult aged 19 years or older who has certain medical conditions and has not been previously immunized should receive 1  dose of PCV13 vaccine. This PCV13 should be followed with a dose of pneumococcal polysaccharide (PPSV23) vaccine. The PPSV23 vaccine dose should be obtained 1 or more year(s)after the dose of PCV13 vaccine. An adult aged 19 years or older who has certain medical conditions and previously received 1 or more doses of PPSV23 vaccine should receive 1 dose of PCV13. The PCV13 vaccine dose should be obtained 1 or more years after the last PPSV23 vaccine dose.  PNEUMOVAX - Pneumococcal polysaccharide (PPSV23) vaccine. When PCV13 is also indicated, PCV13 should be obtained first. All adults aged 65 years and older should be immunized. An adult younger than age 65 years who has certain medical conditions should be immunized. Any person who resides in a nursing home or long-term care facility should be immunized. An adult smoker should be immunized. People with an immunocompromised condition and certain other conditions should receive both PCV13 and PPSV23 vaccines. People with human immunodeficiency virus (HIV) infection should be immunized as soon as possible after diagnosis. Immunization during chemotherapy or radiation therapy should be avoided. Routine use of PPSV23 vaccine is not recommended for American Indians, [place] Natives, or people younger than 65 years unless there are medical conditions that require PPSV23 vaccine. When indicated, people who have unknown immunization and have no record of immunization should receive PPSV23 vaccine. One-time revaccination 5 years after the first dose of PPSV23 is recommended for people aged 19-64 years who have chronic kidney failure, nephrotic syndrome, asplenia, or immunocompromised conditions. People who received 1-2 doses of PPSV23 before age 65 years should receive another dose of PPSV23 vaccine at age 65 years or later if at least 5 years have passed since the previous dose. Doses of PPSV23 are not needed for people immunized with PPSV23 at or after age 65  years.  Hepatitis A vaccine. Adults who wish to be protected from this disease, have certain high-risk conditions, work with hepatitis A-infected animals, work in hepatitis A research labs, or travel to or work in countries with a high rate of hepatitis A should be immunized. Adults who were previously unvaccinated and who anticipate close contact   with an international adoptee during the first 60 days after arrival in the [place] from a country with a high rate of hepatitis A should be immunized.  Hepatitis B vaccine. Adults should be immunized if they wish to be protected from this disease, have certain high-risk conditions, may be exposed to blood or other infectious body fluids, are household contacts or sex partners of hepatitis B positive people, are clients or workers in certain care facilities, or travel to or work in countries with a high rate of hepatitis B.  Preventive Service / Frequency  Ages 62 and over Blood pressure check. Lipid and cholesterol check. Lung cancer screening. / Every year if you are aged 2-80 years and have a 30-pack-year history of smoking and currently smoke or have quit within the past 15 years. Yearly screening is stopped once you have quit smoking for at least 15 years or develop a health problem that would prevent you from having lung cancer treatment. Fecal occult blood test (FOBT) of stool. You may not have to do this test if you get a colonoscopy every 10 years. Flexible sigmoidoscopy** or colonoscopy.** / Every 5 years for a flexible sigmoidoscopy or every 10 years for a colonoscopy beginning at age 44 and continuing until age 49. Hepatitis C blood test.** / For all people born from [birth year] through 1963 and any individual with known risks for hepatitis C. Abdominal aortic aneurysm (AAA) screening./ Screening current or former smokers or have Hypertension. Skin self-exam. / Monthly. Influenza vaccine. / Every year. Tetanus, diphtheria, and acellular  pertussis (Tdap/Td) vaccine.** / 1 dose of Td every 10 years.  Zoster vaccine.** / 1 dose for adults aged 44 years or older.         Pneumococcal 13-valent conjugate (PCV13) vaccine.   Pneumococcal polysaccharide (PPSV23) vaccine.   Hepatitis A vaccine.** / Consult your health care provider. Hepatitis B vaccine.** / Consult your health care provider. Screening for abdominal aortic aneurysm (AAA)  by ultrasound is recommended for people who have history of high blood pressure or who are current or former smokers. ++++++++++ Recommend Adult Low Dose Aspirin or  coated  Aspirin 81 mg daily  To reduce risk of Colon Cancer 40 %,  Skin Cancer 26 % ,  Malignant Melanoma 46%  and  Pancreatic cancer 60% ++++++++++++++++++++++ Vitamin D goal  is between 70-100.  Please make sure that you are taking your Vitamin D as directed.  It is very important as a natural anti-inflammatory  helping hair, skin, and nails, as well as reducing stroke and heart attack risk.  It helps your bones and helps with mood. It also decreases numerous cancer risks so please take it as directed.  Low Vit D is associated with a 200-300% higher risk for CANCER  and 200-300% higher risk for HEART   ATTACK  &  STROKE.   .....................................[name] It is also associated with higher death rate at younger ages,  autoimmune diseases like Rheumatoid arthritis, Lupus, Multiple Sclerosis.    Also many other serious conditions, like depression, Alzheimer's Dementia, infertility, muscle aches, fatigue, fibromyalgia - just to name a few. ++++++++++++++++++++++ Recommend the book "The END of DIETING" by Dr Excell Seltzer  & the book "The END of DIABETES " by Dr Excell Seltzer At [email or web address] - get book & Audio CD's    Being diabetic has a  300% increased risk for heart attack, stroke, cancer, and alzheimer- type vascular dementia. It is very important that you work harder with diet by  avoiding all foods that are white. Avoid  white rice (brown & wild rice is OK), white potatoes (sweetpotatoes in moderation is OK), White bread or wheat bread or anything made out of white flour like bagels, donuts, rolls, buns, biscuits, cakes, pastries, cookies, pizza crust, and pasta (made from white flour & egg whites) - vegetarian pasta or spinach or wheat pasta is OK. Multigrain breads like Arnold's or Pepper[employer], or multigrain sandwich thins or flatbreads.  Diet, exercise and weight loss can reverse and cure diabetes in the early stages.  Diet, exercise and weight loss is very important in the control and prevention of complications of diabetes which affects every system in your body, ie. Brain - dementia/stroke, eyes - glaucoma/blindness, heart - heart attack/heart failure, kidneys - dialysis, stomach - gastric paralysis, intestines - malabsorption, nerves - severe painful neuritis, circulation - gangrene & loss of a leg(s), and finally cancer and Alzheimers.    I recommend avoid fried & greasy foods,  sweets/candy, white rice (brown or wild rice or Quinoa is OK), white potatoes (sweet potatoes are OK) - anything made from white flour - bagels, doughnuts, rolls, buns, biscuits,white and wheat breads, pizza crust and traditional pasta made of white flour & egg white(vegetarian pasta or spinach or wheat pasta is OK).  Multi-grain bread is OK - like multi-grain flat bread or sandwich thins. Avoid alcohol in excess. Exercise is also important.    Eat all the vegetables you want - avoid meat, especially red meat and dairy - especially cheese.  Cheese is the most concentrated form of trans-fats which is the worst thing to clog up our arteries. Veggie cheese is OK which can be found in the fresh produce section at [employer] or [employer] or [employer]  ++++++++++++++++++++++ DASH Eating Plan  DASH stands for "Dietary Approaches to Stop Hypertension."   The DASH eating plan is a healthy eating plan that has been shown to reduce high  blood pressure (hypertension). Additional health benefits may include reducing the risk of type 2 diabetes mellitus, heart disease, and stroke. The DASH eating plan may also help with weight loss. WHAT DO I NEED TO KNOW ABOUT THE DASH EATING PLAN? For the DASH eating plan, you will follow these general guidelines: Choose foods with a percent daily value for sodium of less than 5% (as listed on the food label). Use salt-free seasonings or herbs instead of table salt or sea salt. Check with your health care provider or pharmacist before using salt substitutes. Eat lower-sodium products, often labeled as "lower sodium" or "no salt added." Eat fresh foods. Eat more vegetables, fruits, and low-fat dairy products. Choose whole grains. Look for the word "whole" as the first word in the ingredient list. Choose fish  Limit sweets, desserts, sugars, and sugary drinks. Choose heart-healthy fats. Eat veggie cheese  Eat more home-cooked food and less restaurant, buffet, and fast food. Limit fried foods. Cook foods using methods other than frying. Limit canned vegetables. If you do use them, rinse them well to decrease the sodium. When eating at a restaurant, ask that your food be prepared with less salt, or no salt if possible.                      WHAT FOODS CAN I EAT? Read Dr Fara Olden Fuhrman's books on The End of Dieting & The End of Diabetes  Grains Whole grain or whole wheat bread. Brown rice. Whole grain or whole wheat pasta. Quinoa, bulgur, and  whole grain cereals. Low-sodium cereals. Corn or whole wheat flour tortillas. Whole grain cornbread. Whole grain crackers. Low-sodium crackers.  Vegetables Fresh or frozen vegetables (raw, steamed, roasted, or grilled). Low-sodium or reduced-sodium tomato and vegetable juices. Low-sodium or reduced-sodium tomato sauce and paste. Low-sodium or reduced-sodium canned vegetables.   Fruits All fresh, canned (in natural juice), or frozen fruits.  Protein  Products  All fish and seafood.  Dried beans, peas, or lentils. Unsalted nuts and seeds. Unsalted canned beans.  Dairy Low-fat dairy products, such as skim or 1% milk, 2% or reduced-fat cheeses, low-fat ricotta or cottage cheese, or plain low-fat yogurt. Low-sodium or reduced-sodium cheeses.  Fats and Oils Tub margarines without trans fats. Light or reduced-fat mayonnaise and salad dressings (reduced sodium). Avocado. Safflower, olive, or canola oils. Natural peanut or almond butter.  Other Unsalted popcorn and pretzels. The items listed above may not be a complete list of recommended foods or beverages. Contact your dietitian for more options.  ++++++++++++++++++++  WHAT FOODS ARE NOT RECOMMENDED? Grains/ White flour or wheat flour White bread. White pasta. White rice. Refined cornbread. Bagels and croissants. Crackers that contain trans fat.  Vegetables  Creamed or fried vegetables. Vegetables in a . Regular canned vegetables. Regular canned tomato sauce and paste. Regular tomato and vegetable juices.  Fruits Dried fruits. Canned fruit in light or heavy syrup. Fruit juice.  Meat and Other Protein Products Meat in general - RED meat & White meat.  Fatty cuts of meat. Ribs, chicken wings, all processed meats as bacon, sausage, bologna, salami, fatback, hot dogs, bratwurst and packaged luncheon meats.  Dairy Whole or 2% milk, cream, half-and-half, and cream cheese. Whole-fat or sweetened yogurt. Full-fat cheeses or blue cheese. Non-dairy creamers and whipped toppings. Processed cheese, cheese spreads, or cheese curds.  Condiments Onion and garlic salt, seasoned salt, table salt, and sea salt. Canned and packaged gravies. Worcestershire sauce. Tartar sauce. Barbecue sauce. Teriyaki sauce. Soy sauce, including reduced sodium. Steak sauce. Fish sauce. Oyster sauce. Cocktail sauce. Horseradish. Ketchup and mustard. Meat flavorings and tenderizers. Bouillon cubes. Hot sauce. Tabasco sauce.  Marinades. Taco seasonings. Relishes.  Fats and Oils Butter, stick margarine, lard, shortening and bacon fat. Coconut, palm kernel, or palm oils. Regular salad dressings.  Pickles and olives. Salted popcorn and pretzels.  The items listed above may not be a complete list of foods and beverages to avoid.

## 2023-01-26 LAB — CBC WITH DIFFERENTIAL/PLATELET
Absolute Monocytes: 664 cells/uL (ref 200–950)
Basophils Absolute: 58 cells/uL (ref 0–200)
Basophils Relative: 0.7 %
Eosinophils Absolute: 249 cells/uL (ref 15–500)
Eosinophils Relative: 3 %
HCT: 44 % (ref 38.5–50.0)
Hemoglobin: 15.1 g/dL (ref 13.2–17.1)
Lymphs Abs: 1760 cells/uL (ref 850–3900)
MCH: 30.9 pg (ref 27.0–33.0)
MCHC: 34.3 g/dL (ref 32.0–36.0)
MCV: 90.2 fL (ref 80.0–100.0)
MPV: 11 fL (ref 7.5–12.5)
Monocytes Relative: 8 %
Neutro Abs: 5569 cells/uL (ref 1500–7800)
Neutrophils Relative %: 67.1 %
Platelets: 211 10*3/uL (ref 140–400)
RBC: 4.88 10*6/uL (ref 4.20–5.80)
RDW: 13 % (ref 11.0–15.0)
Total Lymphocyte: 21.2 %
WBC: 8.3 10*3/uL (ref 3.8–10.8)

## 2023-01-26 LAB — URINALYSIS, ROUTINE W REFLEX MICROSCOPIC
Bacteria, UA: NONE SEEN /HPF
Bilirubin Urine: NEGATIVE
Glucose, UA: NEGATIVE
Hgb urine dipstick: NEGATIVE
Leukocytes,Ua: NEGATIVE
Nitrite: NEGATIVE
RBC / HPF: NONE SEEN /HPF (ref 0–2)
Specific Gravity, Urine: 1.025 (ref 1.001–1.035)
Squamous Epithelial / HPF: NONE SEEN /HPF (ref <=5)
WBC, UA: NONE SEEN /HPF (ref 0–5)
pH: 6 (ref 5.0–8.0)

## 2023-01-26 LAB — MICROALBUMIN / CREATININE URINE RATIO
Creatinine, Urine: 175 mg/dL (ref 20–320)
Microalb Creat Ratio: 219 mcg/mg creat — ABNORMAL HIGH (ref <30)
Microalb, Ur: 38.4 mg/dL

## 2023-01-26 LAB — COMPLETE METABOLIC PANEL WITH GFR
AG Ratio: 1.3 (calc) (ref 1.0–2.5)
ALT: 29 U/L (ref 9–46)
AST: 27 U/L (ref 10–35)
Albumin: 3.9 g/dL (ref 3.6–5.1)
Alkaline phosphatase (APISO): 80 U/L (ref 35–144)
BUN/Creatinine Ratio: 14 (calc) (ref 6–22)
BUN: 20 mg/dL (ref 7–25)
CO2: 33 mmol/L — ABNORMAL HIGH (ref 20–32)
Calcium: 9.2 mg/dL (ref 8.6–10.3)
Chloride: 106 mmol/L (ref 98–110)
Creat: 1.4 mg/dL — ABNORMAL HIGH (ref 0.70–1.22)
Globulin: 3.1 g/dL (calc) (ref 1.9–3.7)
Glucose, Bld: 70 mg/dL (ref 65–99)
Potassium: 4 mmol/L (ref 3.5–5.3)
Sodium: 146 mmol/L (ref 135–146)
Total Bilirubin: 0.5 mg/dL (ref 0.2–1.2)
Total Protein: 7 g/dL (ref 6.1–8.1)
eGFR: 49 mL/min/{1.73_m2} — ABNORMAL LOW (ref >=60)

## 2023-01-26 LAB — PSA: PSA: 0.04 ng/mL (ref <=4.00)

## 2023-01-26 LAB — LIPID PANEL
Cholesterol: 152 mg/dL (ref <200)
HDL: 50 mg/dL (ref >=40)
LDL Cholesterol (Calc): 81 mg/dL (calc)
Non-HDL Cholesterol (Calc): 102 mg/dL (calc) (ref <130)
Total CHOL/HDL Ratio: 3 (calc) (ref <5.0)
Triglycerides: 113 mg/dL (ref <150)

## 2023-01-26 LAB — VITAMIN D 25 HYDROXY (VIT D DEFICIENCY, FRACTURES): Vit D, 25-Hydroxy: 57 ng/mL (ref 30–100)

## 2023-01-26 LAB — MAGNESIUM: Magnesium: 2.4 mg/dL (ref 1.5–2.5)

## 2023-01-26 LAB — HEMOGLOBIN A1C
Hgb A1c MFr Bld: 5.7 % of total Hgb — ABNORMAL HIGH (ref <5.7)
Mean Plasma Glucose: 117 mg/dL
eAG (mmol/L): 6.5 mmol/L

## 2023-01-26 LAB — INSULIN, RANDOM: Insulin: 31.2 u[IU]/mL — ABNORMAL HIGH

## 2023-01-26 LAB — MICROSCOPIC MESSAGE

## 2023-01-26 LAB — TSH: TSH: 2.9 mIU/L (ref 0.40–4.50)

## 2023-01-26 NOTE — Progress Notes (Signed)
<><><><><><><><><><><><><><><><><><><><><><><><><><><><><><><><><> <><><><><><><><><><><><><><><><><><><><><><><><><><><><><><><><><> -   Test results slightly outside the reference range are not unusual. If there is anything important, I will review this with you,  otherwise it is considered normal test values.  If you have further questions,  please do not hesitate to contact me at the office or via My Chart.  <><><><><><><><><><><><><><><><><><><><><><><><><><><><><><><><><> <><><><><><><><><><><><><><><><><><><><><><><><><><><><><><><><><>  - Kidney functions  ( eGFR ) look a little dehydrated   - Very important to drink adequate amounts of fluids to prevent permanent damage    - Recommend drink at least 6 bottles                                                        (16 ounces) of fluids /water /day = 96 Oz ~100 oz  - 100 oz = 3,000 cc or 3 liters / day  - >>                                                        That's 1 &1/2 bottles of a 2 liter soda bottle /day !  <><><><><><><><><><><><><><><><><><><><><><><><><><><><><><><><><>  -   A1c = 5.7% shows blood sugar "creeping " up closer to be in an early or preDiabetic range , So  - It's very important to    - Avoid Sweets, Candy & White Stuff   - White Rice, White [place], White Flour  - Breads &  Pasta <><><><><><><><><><><><><><><><><><><><><><><><><><><><><><><><><> <><><><><><><><><><><><><><><><><><><><><><><><><><><><><><><><><>  - PSA - very low - Great - No Prostate Cancer  ! <><><><><><><><><><><><><><><><><><><><><><><><><><><><><><><><><>  -   Vit D = 57 - Great - Please continue dose same  <><><><><><><><><><><><><><><><><><><><><><><><><><><><><><><><><>  -   Chol = 152  -  Excellent   - Very low risk for Heart Attack  / Stroke <><><><><><><><><><><><><><><><><><><><><><><><><><><><><><><><><>  -   All Else - CBC - Kidneys - Electrolytes - Liver - Magnesium & Thyroid    - all  Normal /  OK <><><><><><><><><><><><><><><><><><><><><><><><><><><><><><><><><> <><><><><><><><><><><><><><><><><><><><><><><><><><><><><><><><><> -

## 2023-02-21 DIAGNOSIS — L814 Other melanin hyperpigmentation: Secondary | ICD-10-CM | POA: Diagnosis not present

## 2023-02-21 DIAGNOSIS — D225 Melanocytic nevi of trunk: Secondary | ICD-10-CM | POA: Diagnosis not present

## 2023-02-21 DIAGNOSIS — Z85828 Personal history of other malignant neoplasm of skin: Secondary | ICD-10-CM | POA: Diagnosis not present

## 2023-02-21 DIAGNOSIS — L298 Other pruritus: Secondary | ICD-10-CM | POA: Diagnosis not present

## 2023-02-21 DIAGNOSIS — L821 Other seborrheic keratosis: Secondary | ICD-10-CM | POA: Diagnosis not present

## 2023-02-21 DIAGNOSIS — Z08 Encounter for follow-up examination after completed treatment for malignant neoplasm: Secondary | ICD-10-CM | POA: Diagnosis not present

## 2023-02-28 ENCOUNTER — Other Ambulatory Visit: Payer: Self-pay | Admitting: Nurse Practitioner

## 2023-03-04 ENCOUNTER — Telehealth: Payer: Self-pay

## 2023-03-04 NOTE — Progress Notes (Signed)
Chart Prep for focused outreach started, completed and uploaded to [employer] for CPP review. Reviewed office visits, specialists visits, hospital visits, medications, adherence, and labs. Left detailed voicemail to return call.   Time Spent: 15 min.  Hildred Alamin, CTL

## 2023-03-08 ENCOUNTER — Ambulatory Visit: Payer: Self-pay | Admitting: Pharmacist

## 2023-03-08 DIAGNOSIS — R413 Other amnesia: Secondary | ICD-10-CM

## 2023-03-08 DIAGNOSIS — Z79899 Other long term (current) drug therapy: Secondary | ICD-10-CM

## 2023-03-08 DIAGNOSIS — I10 Essential (primary) hypertension: Secondary | ICD-10-CM

## 2023-03-08 NOTE — Progress Notes (Signed)
Focused Pharmacist Outreach     Spoke with spouse on patient update. Patient is doing well, stable at this time. BPs have been at goal with no readings > 150/90. Recent medication changes by Dr. Beulah Gandy have been Namenda '10mg'$  BID + Aricept '10mg'$  HS- pt is tolerating well, no GI adverse effects, reports sleeping well, and appetite remains strong. Spouse had no further questions or concerns at this time. Will schedule follow up in 2 months after visit with Hinton Dyer, NP on 5/18.     CPP Telemedicine Time: 10 min

## 2023-03-17 ENCOUNTER — Ambulatory Visit: Payer: Medicare Other | Admitting: Nurse Practitioner

## 2023-04-21 ENCOUNTER — Ambulatory Visit: Payer: Medicare Other | Admitting: Nurse Practitioner

## 2023-04-27 DIAGNOSIS — Z87891 Personal history of nicotine dependence: Secondary | ICD-10-CM | POA: Diagnosis not present

## 2023-04-27 DIAGNOSIS — Z974 Presence of external hearing-aid: Secondary | ICD-10-CM | POA: Diagnosis not present

## 2023-04-27 DIAGNOSIS — H903 Sensorineural hearing loss, bilateral: Secondary | ICD-10-CM | POA: Diagnosis not present

## 2023-04-27 DIAGNOSIS — R4189 Other symptoms and signs involving cognitive functions and awareness: Secondary | ICD-10-CM | POA: Diagnosis not present

## 2023-05-03 ENCOUNTER — Telehealth: Payer: Self-pay | Admitting: Pharmacist

## 2023-05-03 NOTE — Progress Notes (Unsigned)
MEDICARE ANNUAL WELLNESS VISIT AND FOLLOW UP  Assessment:   Diagnoses and all orders for this visit:  Encounter for Medicare annual wellness exam Yearly  Essential hypertension At goal off of medications Monitor blood pressure at home; call if consistently over 130/80 Continue DASH diet.   Reminder to go to the ER if any CP, SOB, nausea, dizziness, severe HA, changes vision/speech, left arm numbness and tingling and jaw pain. - CBC  Gastroesophageal reflux disease, esophagitis presence not specified Currently well controlled with lifestyle changes - avoiding triggers, excessively large meals, lying down after eating OTC agents for rare symptoms; contact office if frequency increases  History of prostate cancer S/p prostatectomy 1999; no issues or suspected recurrence.  Wears pad for incontinence and doing well Continue to monitor.   Mixed hyperlipidemia Continue medications and supplements for LDL goal <100 Continue low cholesterol diet and exercise.  Check lipid panel.  - CMP  Abnormal glucose Previously prediabetic currently well controlled with lifestyle changes Encouraged to continue with lifestyle Monitor A1C  Vitamin D deficiency Continue supplementation and routine monitoring At goal at recent check; continue to recommend supplementation for goal of 60-100 Defer vitamin D level  History of DVT Currently on Xarelto  History of basal cell carcinoma Continue follow up with derm q6 months or as recommended; wear sunscreen when outdoors and reapply regularly.  Body mass index (BMI) of 22.0-22.9 in adult Rec. Maintain weight with balanced diet and exercise  Former smoker/40+ pack year hx No sx, last cxr 2019, no sx, declines imaging at this time  Memory changes Had normal basic labs, smoker 40+ pack year, had MRI suggestive of microvascular changes Continue on Aricept and follow with Gerontology at [facility] Continue ASA, statin      Hard of hearing Continue  hearing aids   Will call with lab results: patient / wife  Over 30 minutes of face to face interview, exam, counseling, chart review, and critical decision making was performed  Future Appointments  Date Time Provider Department Center  05/04/2023  2:30 PM Brian Dick, NP [facility] None  08/11/2023 10:30 AM Brian Cowboy, MD [facility] None  11/14/2023 10:30 AM Brian Dick, NP [facility] None  02/15/2024 11:00 AM Brian Cowboy, MD [facility] None     Plan:   During the course of the visit the patient was educated and counseled about appropriate screening and preventive services including:   Pneumococcal vaccine  Influenza vaccine Prevnar 13 Td vaccine Screening electrocardiogram Colorectal cancer screening Diabetes screening Glaucoma screening Nutrition counseling    Subjective:  Brian Proctor is a 87 y.o. male who presents for Medicare Annual Wellness Visit and 3 month follow up for HTN, HLD, history prediabetes, and vitamin D Def.   S/p prostatectomy for CA in 1999 - has incontinence for which he wears a pad.   He has hx of basal cell skin CA for which he sees dermatology q71m for monitoring.   He had right lower leg DVT 05/05/21. He believes he is still on Xarelto. His wife lays out his medicine and is uncertain of his particular medications  He is followed by Dr. Terrace Proctor for mild cognitive impairment and is on Donepezil 10 mg daily. Deferred MMSE as he is also followed by gerontology Brian Proctor and was evaluated 08/2021  GERD well controlled by lifestyle modification; uses alka-seltzer for rare symptoms. He does endorse increased belching but denies other symptoms. He feels well controlled with current regimen.   He has reported ongoing gradual  short term memory loss, had MRI in 06/2019 for these reports which showed microvascular changes. Declined namenda/aricept at that time, had basic labs at that time including b12 which were normal, today  repots interest in seeing a neurologist to discuss further.   BMI is There is no height or weight on file to calculate BMI., he has been working on diet and exercise. His various medical conditions are very well controlled - most managed by lifestyle modification. He continues to walk 60 min 3-4 times a week without issues, golfs and does regular yard work.  Wt Readings from Last 3 Encounters:  01/25/23 145 lb 6.4 oz (66 kg)  09/28/22 145 lb (65.8 kg)  06/22/22 145 lb 9.6 oz (66 kg)   His blood pressure has been controlled at home, today their BP is    BP Readings from Last 3 Encounters:  01/25/23 136/78  09/28/22 122/64  06/22/22 124/68    He does workout. He denies chest pain, shortness of breath, dizziness.   He is on cholesterol medication (atorvastatatin 40 mg daily), on omega 3 supplement and lifestyle changes. His cholesterol is at goal. The cholesterol last visit was:  Lab Results  Component Value Date   CHOL 152 01/25/2023   HDL 50 01/25/2023   LDLCALC 81 01/25/2023   TRIG 113 01/25/2023   CHOLHDL 3.0 01/25/2023   He has been working on diet and exercise for history of prediabetes, and denies increased appetite, nausea, paresthesia of the feet, polydipsia, polyuria and visual disturbances. Last A1C in the office was:  Lab Results  Component Value Date   HGBA1C 5.7 (H) 01/25/2023   Last GFR Lab Results  Component Value Date   GFRNONAA >60 04/30/2022    Patient is on Vitamin D supplement.   Lab Results  Component Value Date   VD25OH 57 01/25/2023      Medication Review: Current Outpatient Medications on File Prior to Visit  Medication Sig Dispense Refill   aspirin 81 MG tablet Take 81 mg by mouth daily.     atorvastatin (LIPITOR) 40 MG tablet TAKE 1 TABLET BY MOUTH EVERY OTHER DAY FOR CHOLESTEROL 90 tablet 0   cholecalciferol (VITAMIN D3) 25 MCG (1000 UNIT) tablet Take 1,000 Units by mouth in the morning and at bedtime.     CINNAMON PO Take 2,000 mg by mouth  daily.     Coenzyme Q10 200 MG TABS Take 200 mg by mouth daily.     donepezil (ARICEPT) 10 MG tablet Take 10 mg by mouth at bedtime.     fish oil-omega-3 fatty acids 1000 MG capsule Take 1,200 mg by mouth daily.     fludrocortisone (FLORINEF) 0.1 MG tablet Take  1 tablet  2 to 3 x /day  to Raise BP 270 tablet 3   Magnesium 250 MG TABS Take 500 mg by mouth daily.     memantine (NAMENDA) 5 MG tablet Take by mouth.     Multiple Vitamins-Minerals (MULTIVITAMIN PO) Take by mouth daily.     triamcinolone ointment (KENALOG) 0.1 % Apply 1 application topically 2 (two) times daily. Apply to Rash 2 to 3 x /day 80 g 3   vitamin C (ASCORBIC ACID) 500 MG tablet Take 1,000 mg by mouth daily.     zinc gluconate 50 MG tablet Take 50 mg by mouth daily.     No current facility-administered medications on file prior to visit.    Allergies: No Known Allergies  Current Problems (verified) has Hyperlipidemia, mixed; Essential  hypertension; GERD (gastroesophageal reflux disease); Vitamin D deficiency; Medication management; History of prostate cancer; BMI 22.0-22.9, adult; History of basal cell carcinoma; Former smoker; FH: heart disease; COPD (chronic obstructive pulmonary disease) (HCC); Abnormal glucose; and Vascular dementia without behavioral disturbance (HCC) on their problem list.  Screening Tests Immunization History  Administered Date(s) Administered   DT (Pediatric) 09/03/2015   Fluad Quad(high Dose 65+) 10/13/2019   Influenza Inj Mdck Quad Pf 11/10/2018   Influenza Split 10/28/2016   Influenza, High Dose Seasonal PF 09/27/2014, 09/03/2015, 10/05/2017, 10/07/2020, 10/21/2021   Influenza,inj,Quad PF,6+ Mos 10/11/2018   PFIZER Comirnaty(Gray Top)Covid-19 Tri-Sucrose Vaccine 05/22/2021   PFIZER(Purple Top)SARS-COV-2 Vaccination 01/17/2020, 02/07/2020, 09/23/2020   Pfizer Covid-19 Vaccine Bivalent Booster 50yrs & up 10/21/2021   Pneumococcal Conjugate-13 10/16/2014   Pneumococcal Polysaccharide-23  03/16/2022   Pneumococcal-Unspecified 02/03/2011   Td 07/26/2005, 08/07/2018   Zoster, Live 12/27/2006   Health Maintenance  Topic Date Due   Zoster Vaccines- Shingrix (1 of 2) Never done   COVID-19 Vaccine (6 - 2023-24 season) 08/27/2022   Medicare Annual Wellness (AWV)  03/17/2023   INFLUENZA VACCINE  07/28/2023   DTaP/Tdap/Td (4 - Tdap) 08/07/2028   Pneumonia Vaccine 76+ Years old  Completed   HPV VACCINES  Aged Out    Preventative care: MRI brain 07/2019 microvascular changes  Names of Other Physician/Practitioners you currently use: 1. [facility] here for primary care 2. [facility], eye doctor, last visit 2022?, wears glasses, no issues 3. [facility], dentist, last visit 2022 - goes q6 months  Sees dermatology q 6 months   Patient Care Team: Brian Cowboy, MD as PCP - General (Internal Medicine) Charlett Nose, [facility] (Inactive) as Pharmacist (Pharmacist)  Surgical: He  has a past surgical history that includes Tonsillectomy; Prostate surgery (1999 or 2000); Colonoscopy; and Inguinal hernia repair (01/15/2013). Family His family history includes Autism in his son; COPD in his father; Heart disease in his father; Other in his mother. Social history  He reports that he quit smoking about 24 years ago. His smoking use included cigarettes. He has a 40.00 pack-year smoking history. He has never used smokeless tobacco. He reports that he does not drink alcohol and does not use drugs.  MEDICARE WELLNESS OBJECTIVES: Physical activity:   Cardiac risk factors:   Depression/mood screen:      01/25/2023   12:47 AM  Depression screen PHQ 2/9  Decreased Interest 0  Down, Depressed, Hopeless 0  PHQ - 2 Score 0    ADLs:     01/25/2023   12:47 AM 06/22/2022    8:37 PM  In your present state of health, do you have any difficulty performing the following activities:  Hearing? 0 1  Comment  bilat Hearing aides  Vision? 0    Difficulty concentrating or making decisions? 0 1  Comment  admits forgetful  Walking or climbing stairs? 0 0  Dressing or bathing? 0 0  Doing errands, shopping? 0 0     Cognitive Testing  Alert? Yes  Normal Appearance?Yes  Oriented to person? Yes  Place? Yes   Time? Yes  Recall of three objects?  No 1/3  Can perform simple calculations? Yes  Displays appropriate judgment?Yes  Can read the correct time from a watch face?Yes  EOL planning:     Objective:   There were no vitals filed for this visit.   There is no height or weight on file to calculate BMI.  General appearance: alert, no distress, WD/WN,  male HEENT: normocephalic, sclerae anicteric, TMs pearly, nares patent, no discharge or erythema, pharynx normal Oral cavity: MMM, no lesions Neck: supple, no lymphadenopathy, no thyromegaly, no masses Heart: RRR, normal S1, S2, no murmurs Lungs: CTA bilaterally, no wheezes, rhonchi, or rales Abdomen: +bs, soft, non tender, non distended, no masses, no hepatomegaly, no splenomegaly Musculoskeletal: nontender, no swelling, no obvious deformity Extremities: no edema, no cyanosis, no clubbing Pulses: 2+ symmetric, upper and lower extremities, normal cap refill Neurological: alert, oriented x 3, CN2-12 intact, strength normal upper extremities and lower extremities, sensation normal throughout, DTRs 2+ throughout, no cerebellar signs, gait normal. Poor short term recall.  Psychiatric: normal affect, behavior normal, pleasant  Skin: warm/dry, intact, no rashes  Medicare Attestation I have personally reviewed: The patient's medical and social history Their use of alcohol, tobacco or illicit drugs Their current medications and supplements The patient's functional ability including ADLs,fall risks, home safety risks, cognitive, and hearing and visual impairment Diet and physical activities Evidence for depression or mood disorders  The patient's weight, height, BMI, and visual  acuity have been recorded in the chart.  I have made referrals, counseling, and provided education to the patient based on review of the above and I have provided the patient with a written personalized care plan for preventive services.     Brian Dick, NP   05/03/2023

## 2023-05-03 NOTE — Progress Notes (Signed)
HC focus outreach prep started. Reviewing office visits, specialists visits, hospital visits, adherence, medications, and labs. Pt confirmed phone visit for 05/05/2023 with CRN. ( )

## 2023-05-04 ENCOUNTER — Encounter: Payer: Self-pay | Admitting: Nurse Practitioner

## 2023-05-04 ENCOUNTER — Ambulatory Visit (INDEPENDENT_AMBULATORY_CARE_PROVIDER_SITE_OTHER): Payer: Medicare Other | Admitting: Nurse Practitioner

## 2023-05-04 VITALS — BP 128/82 | HR 69 | Temp 97.7°F | Ht 69.0 in | Wt 146.8 lb

## 2023-05-04 DIAGNOSIS — G309 Alzheimer's disease, unspecified: Secondary | ICD-10-CM | POA: Diagnosis not present

## 2023-05-04 DIAGNOSIS — K219 Gastro-esophageal reflux disease without esophagitis: Secondary | ICD-10-CM

## 2023-05-04 DIAGNOSIS — R7309 Other abnormal glucose: Secondary | ICD-10-CM | POA: Diagnosis not present

## 2023-05-04 DIAGNOSIS — Z79899 Other long term (current) drug therapy: Secondary | ICD-10-CM

## 2023-05-04 DIAGNOSIS — I10 Essential (primary) hypertension: Secondary | ICD-10-CM

## 2023-05-04 DIAGNOSIS — Z86718 Personal history of other venous thrombosis and embolism: Secondary | ICD-10-CM | POA: Diagnosis not present

## 2023-05-04 DIAGNOSIS — Z0001 Encounter for general adult medical examination with abnormal findings: Secondary | ICD-10-CM | POA: Diagnosis not present

## 2023-05-04 DIAGNOSIS — E782 Mixed hyperlipidemia: Secondary | ICD-10-CM | POA: Diagnosis not present

## 2023-05-04 DIAGNOSIS — Z6822 Body mass index (BMI) 22.0-22.9, adult: Secondary | ICD-10-CM

## 2023-05-04 DIAGNOSIS — F0280 Dementia in other diseases classified elsewhere without behavioral disturbance: Secondary | ICD-10-CM

## 2023-05-04 DIAGNOSIS — Z8546 Personal history of malignant neoplasm of prostate: Secondary | ICD-10-CM

## 2023-05-04 DIAGNOSIS — N1831 Chronic kidney disease, stage 3a: Secondary | ICD-10-CM

## 2023-05-04 DIAGNOSIS — E559 Vitamin D deficiency, unspecified: Secondary | ICD-10-CM | POA: Diagnosis not present

## 2023-05-04 DIAGNOSIS — Z0000 Encounter for general adult medical examination without abnormal findings: Secondary | ICD-10-CM

## 2023-05-04 DIAGNOSIS — Z87891 Personal history of nicotine dependence: Secondary | ICD-10-CM

## 2023-05-04 DIAGNOSIS — H9190 Unspecified hearing loss, unspecified ear: Secondary | ICD-10-CM

## 2023-05-04 DIAGNOSIS — Z85828 Personal history of other malignant neoplasm of skin: Secondary | ICD-10-CM | POA: Diagnosis not present

## 2023-05-04 DIAGNOSIS — R6889 Other general symptoms and signs: Secondary | ICD-10-CM | POA: Diagnosis not present

## 2023-05-04 DIAGNOSIS — R413 Other amnesia: Secondary | ICD-10-CM

## 2023-05-04 DIAGNOSIS — F0150 Vascular dementia without behavioral disturbance: Secondary | ICD-10-CM

## 2023-05-04 NOTE — Patient Instructions (Signed)

## 2023-05-05 ENCOUNTER — Telehealth: Payer: Self-pay | Admitting: *Deleted

## 2023-05-05 LAB — LIPID PANEL
Cholesterol: 137 mg/dL (ref <200)
HDL: 47 mg/dL (ref >=40)
LDL Cholesterol (Calc): 67 mg/dL (calc)
Non-HDL Cholesterol (Calc): 90 mg/dL (calc) (ref <130)
Total CHOL/HDL Ratio: 2.9 (calc) (ref <5.0)
Triglycerides: 156 mg/dL — ABNORMAL HIGH (ref <150)

## 2023-05-05 LAB — CBC WITH DIFFERENTIAL/PLATELET
Absolute Monocytes: 806 cells/uL (ref 200–950)
Basophils Absolute: 77 cells/uL (ref 0–200)
Basophils Relative: 0.8 %
Eosinophils Absolute: 326 cells/uL (ref 15–500)
Eosinophils Relative: 3.4 %
HCT: 44.8 % (ref 38.5–50.0)
Hemoglobin: 14.8 g/dL (ref 13.2–17.1)
Lymphs Abs: 2102 cells/uL (ref 850–3900)
MCH: 30.3 pg (ref 27.0–33.0)
MCHC: 33 g/dL (ref 32.0–36.0)
MCV: 91.8 fL (ref 80.0–100.0)
MPV: 11 fL (ref 7.5–12.5)
Monocytes Relative: 8.4 %
Neutro Abs: 6288 cells/uL (ref 1500–7800)
Neutrophils Relative %: 65.5 %
Platelets: 221 10*3/uL (ref 140–400)
RBC: 4.88 10*6/uL (ref 4.20–5.80)
RDW: 13.1 % (ref 11.0–15.0)
Total Lymphocyte: 21.9 %
WBC: 9.6 10*3/uL (ref 3.8–10.8)

## 2023-05-05 LAB — COMPLETE METABOLIC PANEL WITH GFR
AG Ratio: 1.6 (calc) (ref 1.0–2.5)
ALT: 21 U/L (ref 9–46)
AST: 24 U/L (ref 10–35)
Albumin: 4.1 g/dL (ref 3.6–5.1)
Alkaline phosphatase (APISO): 84 U/L (ref 35–144)
BUN: 19 mg/dL (ref 7–25)
CO2: 30 mmol/L (ref 20–32)
Calcium: 9.2 mg/dL (ref 8.6–10.3)
Chloride: 106 mmol/L (ref 98–110)
Creat: 1.16 mg/dL (ref 0.70–1.22)
Globulin: 2.5 g/dL (calc) (ref 1.9–3.7)
Glucose, Bld: 89 mg/dL (ref 65–99)
Potassium: 3.8 mmol/L (ref 3.5–5.3)
Sodium: 144 mmol/L (ref 135–146)
Total Bilirubin: 0.5 mg/dL (ref 0.2–1.2)
Total Protein: 6.6 g/dL (ref 6.1–8.1)
eGFR: 62 mL/min/{1.73_m2} (ref >=60)

## 2023-05-05 LAB — TSH: TSH: 1.97 mIU/L (ref 0.40–4.50)

## 2023-05-05 LAB — MAGNESIUM: Magnesium: 2.5 mg/dL (ref 1.5–2.5)

## 2023-05-05 NOTE — Telephone Encounter (Addendum)
CRN called and spoke with wife regarding patient for scheduled follow up.   1. HTN: Blood pressures have been stable and at goal. Wife states she will start monitoring his BP daily.  2. Alzheimer's/Vascular Dementia: Continues on Aricept and Namenda and is tolerating well. Wife says she has to give constant reminders but he is able to follow commands and perform tasks independently. Feels memory is not getting any worse at this time.  3. CKD stage 3a: Wife is going to work on pushing fluids to keep patient more hydrated.  Overall, wife feels he is doing ok. Appetite is good. He is sleeping alot during the day, but she feels some of this is due to boredom as he is unable to golf and do other things he used to do. Denies pain. Patient had annual wellness visit yesterday.  Next follow up: 2 months  CRN Phone call: 5 min CRN Documentation: 2 min

## 2023-05-27 DIAGNOSIS — G309 Alzheimer's disease, unspecified: Secondary | ICD-10-CM | POA: Diagnosis not present

## 2023-05-27 DIAGNOSIS — E782 Mixed hyperlipidemia: Secondary | ICD-10-CM

## 2023-05-27 DIAGNOSIS — R634 Abnormal weight loss: Secondary | ICD-10-CM | POA: Diagnosis not present

## 2023-05-27 DIAGNOSIS — F0280 Dementia in other diseases classified elsewhere without behavioral disturbance: Secondary | ICD-10-CM | POA: Diagnosis not present

## 2023-05-27 DIAGNOSIS — F0150 Vascular dementia without behavioral disturbance: Secondary | ICD-10-CM | POA: Diagnosis not present

## 2023-05-27 DIAGNOSIS — I10 Essential (primary) hypertension: Secondary | ICD-10-CM

## 2023-05-27 DIAGNOSIS — E559 Vitamin D deficiency, unspecified: Secondary | ICD-10-CM

## 2023-05-27 DIAGNOSIS — K219 Gastro-esophageal reflux disease without esophagitis: Secondary | ICD-10-CM

## 2023-05-27 NOTE — Telephone Encounter (Signed)
CARE PLAN  Printed on: 05/27/2023 Purohit,Cheng  __________________________________________________  Ongoing Problems Problem: CP: 1. Actions for Patient  Goal 1: Review and act on the below actions as discussed with your clinical pharmacist Priority: High  Achieve by: 05/27/2023  Long-term   Care Team Interventions:   1. Action 1: Continue home BP monitoring and record in a log for review. Done on: 05/27/2023  2. Action 2:  Aim to consume 8 cups of water daily. Done on: 05/27/2023  3. Action 3: Continue all medications as prescribed. Done on: 05/27/2023  4. Next outreach by CRN: 2 months Done on: 05/27/2023 Problem: CP: 2. Chronic Care Management (CCM) Team  Goal 1: Contact your clinical pharmacist and care team with any questions or concerns Priority: Medium  Achieve by: 05/27/2023   Care Team Interventions:   1. Team Phone #: [phone] Done on: 05/27/2023  2. Clinical Pharmacist: Yolande Jolly Done on: 05/27/2023  3. Coordinating Registered Nurse: Candiss Norse Done on: 05/27/2023  4. Health Concierge: Maryellen Pile Done on: 05/27/2023 Problem: CP: Hypertension (HTN)  Goal 1: Maintain Blood Pressure less than 130/80.    Your current BP is 128/82 (05/04/23) Priority: Medium  Achieve by: 05/27/2023  Long-term   Care Team Interventions:   1. Recommend following the DASH diet, which emphasizes fruits and vegetables and low-fat dairy products along with whole grains, fish, poultry, and nuts. Reduce red meats and sugars. Done on: 05/27/2023  2. Recommended Patient continue checking home BP readings 5-7 times per week Done on: 05/27/2023  3. Educated patient to call PCP office if you develop episodes of low blood pressure, dizziness, falls or increased headaches, and/or swelling of legs and feet. Done on: 05/27/2023 Problem: CP: Dementia  Goal 1: Patient will maintain independence and current cognitive abilities for as long as possible Priority:  Medium  Achieve by: 05/27/2023  Long-term   Care Team Interventions:   1. Continue Aricept and Namenda as prescribed Done on: 05/27/2023 Problem: CP: Chronic kidney disease (CKD)  Goal 1: Slow progression of kidney disease and maintain blood pressure (<130/80)  Priority: Medium  Achieve by: 05/27/2023  Long-term   Care Team Interventions:   1. Educated Patient on importance of blood pressure and blood sugar control to help stop kidney disease from worsening. Done on: 05/27/2023  2. Recommend avoiding medications that can harm the kidneys, such as a group of medications called NSAIDs, which include ibuprofen (Advil/Motrin) and naproxen (Aleve). Done on: 05/27/2023  3. Educated patient to call pharmacist/nurse/provider with any sudden increase in weight or swelling (hands, feet, ankles, legs, stomach). Done on: 05/27/2023 Problem: CP: Preventative health care screenings  Goal 1: Patient will have all recommended preventative health care screening(s) Priority: Medium  Achieve by: 05/27/2023   Care Team Interventions:   1. Annual Wellness Visit already completed on 05/04/23  Care Plan [place] Completed 05/27/2023: 

## 2023-08-11 ENCOUNTER — Ambulatory Visit: Payer: Medicare Other | Admitting: Internal Medicine

## 2023-08-12 ENCOUNTER — Other Ambulatory Visit: Payer: Self-pay | Admitting: Internal Medicine

## 2023-08-12 DIAGNOSIS — I951 Orthostatic hypotension: Secondary | ICD-10-CM

## 2023-08-21 ENCOUNTER — Encounter: Payer: Self-pay | Admitting: Internal Medicine

## 2023-08-21 NOTE — Patient Instructions (Signed)

## 2023-08-21 NOTE — Progress Notes (Unsigned)
Future Appointments  Date Time Provider Department  08/22/2023                    6 mo   2:30 PM Lucky Cowboy, MD [facility]  11/14/2023                   9 mo  10:30 AM Raynelle Dick, NP [facility]  02/15/2024                     cpe 11:00 AM Lucky Cowboy, MD [facility]    History of Present Illness:       This very nice 87 y.o. [name]   with HTN, HLD, Vascular Dementia, hx/o Prostate cancer (2000),  Pre-Diabetes and Vitamin D Deficiency  presents for 6 month follow up .  In 2000, patient had a Suprapubic Prostatectomy by Dr Isabel Caprice. In March 2021, patient was started on Namenda and Donazepil  by Dr Terrace Arabia for mild Dementia.                                                      In May 2022, he was dx'd with a RLE  DVT and prescribed Xarelto x 6 months  til d/c' in Dec 2022 .           Patient is followed for labile for HTN (2005)  & BP has been controlled until seen in the ER for postural hypotension which persisted & several days later in May 2023 in the office, he was initiated on g Florinef.  Wife reports recent home BPs have been elevated , so she cut down on his florinef from 3 tabs to 2 tabs /day. Today's BP is  borderline elevated at   138/86 . Patient has had no complaints of any cardiac type chest pain, palpitations, dyspnea Pollyann Kennedy /PND, dizziness, claudication or dependent edema.       Hyperlipidemia is controlled with diet & Atorvastatin. Patient denies myalgias or other med SE's. Last Lipids were at goal:  Lab Results  Component Value Date   CHOL 137 05/04/2023   HDL 47 05/04/2023   LDLCALC 67 05/04/2023   TRIG 156 (H) 05/04/2023   CHOLHDL 2.9 05/04/2023     Also, the patient has history of PreDiabetes (A1c 5.7% /2013 & 5.3% /2014) and has had no symptoms of reactive hypoglycemia, diabetic polys, paresthesias or visual blurring.  Last A1c was normal & at goal:  Lab Results  Component Value Date   HGBA1C 5.7 (H) 01/25/2023                                                        Further, the patient also has history of Vitamin D Deficiency ("41" /2013) and supplements vitamin D without any suspected side-effects. Last vitamin D was still sl low (goal 70-100):  Lab Results  Component Value Date   VD25OH 57 01/25/2023      Current Outpatient Medications  Medication Instructions   VITAMIN C1,000 mg   Daily   aspirin 81 mg  Daily   Atorvastatin 40 MG tablet TAKE 1 TABLET EVERY OTHER DAY    VITAMIN  D   1,000 Units 2 times daily   CINNAMON  2,000 mg, Daily   Coenzyme Q10    200 mg Daily   donepezil (ARICEPT)  10 mg   Daily at bedtime   fish oil-omega-3 fatty acids  1,200 mg Daily   fludrocortisone 0.1 MG tablet Take  1 tablet   2 to 3 x / day     Magnesium  500 mg Daily   memantine 5 MG tablet Oral   Multiple Vitamins-Minerals  Daily   triamcinolone oint  0.1 % Apply to Rash 2 to 3 x /day   zinc  50 mg  Daily     No Known Allergies  PMHx:   Past Medical History:  Diagnosis Date   Cancer (HCC)    prostate - basal cell   GERD (gastroesophageal reflux disease)    Hearing loss    wear hearing aid   Hyperlipidemia    Hypertension    Inguinal hernia    Memory loss    Prediabetes    Vitamin D deficiency    Wears glasses    Wears hearing aid    both ears     Immunization History  Administered Date(s) Administered   DT (Pediatric) 09/03/2015   Fluad Quad(high Dose) 10/13/2019   Influenza Inj Mdck Quad  11/10/2018   Influenza Split 10/28/2016   Influenza, High Dose Seasonal PF 09/27/2014, 09/03/2015, 10/05/2017   Influenza,inj,Quad PF,6+ Mos 10/11/2018   PFIZER SARS-COV-2 Vacc  01/17/2020, 02/07/2020   Pneumococcal -13 10/16/2014   Pneumococcal-23 02/03/2011   Td 07/26/2005, 08/07/2018   Zoster, Live 12/27/2006     Past Surgical History:  Procedure Laterality Date   COLONOSCOPY     x2   INGUINAL HERNIA REPAIR  01/15/2013   Procedure: HERNIA REPAIR INGUINAL ADULT;  Surgeon: Cherylynn Ridges, MD;  Location: [facility];  Service: General;  Laterality: Right;   PROSTATE SURGERY  1999 or 2000   due to cancer   TONSILLECTOMY     as a child    FHx:    Reviewed / unchanged  SHx:    Reviewed / unchanged   Systems Review:  Constitutional: Denies fever, chills, wt changes, headaches, insomnia, fatigue, night sweats, change in appetite. Eyes: Denies redness, blurred vision, diplopia, discharge, itchy, watery eyes.  ENT: Denies discharge, congestion, post nasal drip, epistaxis, sore throat, earache, hearing loss, dental pain, tinnitus, vertigo, sinus pain, snoring.  CV: Denies chest pain, palpitations, irregular heartbeat, syncope, dyspnea, diaphoresis, orthopnea, PND, claudication or edema. Respiratory: denies cough, dyspnea, DOE, pleurisy, hoarseness, laryngitis, wheezing.  Gastrointestinal: Denies dysphagia, odynophagia, heartburn, reflux, water brash, abdominal pain or cramps, nausea, vomiting, bloating, diarrhea, constipation, hematemesis, melena, hematochezia  or hemorrhoids. Genitourinary: Denies dysuria, frequency, urgency, nocturia, hesitancy, discharge, hematuria or flank pain. Musculoskeletal: Denies arthralgias, myalgias, stiffness, jt. swelling, pain, limping or strain/sprain.  Skin: Denies pruritus, rash, hives, warts, acne, eczema or change in skin lesion(s). Neuro: No weakness, tremor, incoordination, spasms, paresthesia or pain. Psychiatric: Denies confusion, memory loss or sensory loss. Endo: Denies change in weight, skin or hair change.  Heme/Lymph: No excessive bleeding, bruising or enlarged lymph nodes.  Physical Exam  BP 138/86   Pulse (!) 56   Temp 97.9 F (36.6 C)   Resp 16   Ht 5\' 10"  (1.778 m)   Wt 145 lb (65.8 kg)   SpO2 97%   BMI 20.81 kg/m   Postural            Sitting  BP   155/102   P  52         &         Standing      BP    168/106     P52  Appears  statede age of 82, well groomed  and in no distress.  Eyes: PERRLA, EOMs, conjunctiva no swelling  or erythema. Sinuses: No frontal/maxillary tenderness ENT/Mouth:  Hearing aides -au. EAC's clear, TM's nl w/o erythema, bulging. Nares clear w/o erythema, swelling, exudates. Oropharynx clear without erythema or exudates. Oral hygiene is good. Tongue normal, non obstructing. Hearing intact.  Neck: Supple. Thyroid not palpable. Car 2+/2+ without bruits, nodes or JVD. Chest: Respirations nl with BS clear & equal w/o rales, rhonchi, wheezing or stridor.  Cor: Heart sounds normal w/ regular rate and rhythm without sig. murmurs, gallops, clicks or rubs. Peripheral pulses normal and equal  without edema.  Abdomen: Soft & bowel sounds normal. Non-tender w/o guarding, rebound, hernias, masses or organomegaly.  Lymphatics: Unremarkable.  Musculoskeletal: Full ROM all peripheral extremities, joint stability, 5/5 strength and normal gait.  Skin: Warm, dry without exposed  lesions or ecchymosis apparent. Has area or eczema of Left lateral proximal thigh.  Neuro: Cranial nerves intact, reflexes equal bilaterally. Sensory-motor testing grossly intact. Tendon reflexes grossly intact.  Pysch: Alert & oriented x 3.  Insight and judgement nl & appropriate. No ideations.  Assessment and Plan:  1. Postural hypotension  -  monitor blood pressure at home as below.  - CBC with Differential/Platelet - COMPLETE METABOLIC PANEL WITH GFR - Magnesium   2. Labile hypertension  - Advised taper florinef to only 1 tablet /day &                                 monitor  BP 2 x/day & if remains elevated                                                              to d/c florinef completely for now.  Then continue BP monitoring &                                  call status if high or low BPs with postural readings.    - Continue DASH diet.  Reminder to go to the ER if any CP,  SOB, nausea, dizziness, severe HA, changes vision/speech.    - CBC with Differential/Platelet - COMPLETE METABOLIC PANEL WITH GFR -  Magnesium - TSH   3. Hyperlipidemia, mixed    - Continue diet/meds, exercise,& lifestyle modifications.  - Continue monitor periodic cholesterol/liver & renal functions      - Lipid panel - TSH   4. Abnormal glucose  - Continue diet, exercise  - Lifestyle modifications.  - Monitor appropriate labs    - Hemoglobin A1c - Insulin, random   5. Vitamin D deficiency    - Continue supplementation.    - VITAMIN D 25 Hydroxy   6. Vascular dementia without behavioral disturbance (HCC)   7. Medication management  - CBC with Differential/Platelet - COMPLETE METABOLIC PANEL WITH GFR - Magnesium - Lipid panel - TSH -  Hemoglobin A1c - Insulin, random - VITAMIN D 25 Hydroxy         Discussed  regular exercise, BP monitoring, weight control to achieve/maintain BMI less than 25 and discussed med and SE's. Recommended labs to assess and monitor clinical status with further disposition pending results of labs.  I discussed the assessment and treatment plan with the patient. The patient was provided an opportunity to ask questions and all were answered. The patient agreed with the plan and demonstrated an understanding of the instructions.  I provided over 30 minutes of exam, counseling, chart review and  complex critical decision making.         The patient was advised to call back or seek an in-person evaluation if the symptoms worsen or if the condition fails to improve as anticipated.   Marinus Maw, MD

## 2023-08-22 ENCOUNTER — Encounter: Payer: Self-pay | Admitting: Internal Medicine

## 2023-08-22 ENCOUNTER — Ambulatory Visit (INDEPENDENT_AMBULATORY_CARE_PROVIDER_SITE_OTHER): Payer: Medicare Other | Admitting: Internal Medicine

## 2023-08-22 ENCOUNTER — Other Ambulatory Visit: Payer: Self-pay | Admitting: Nurse Practitioner

## 2023-08-22 VITALS — BP 138/86 | HR 56 | Temp 97.9°F | Resp 16 | Ht 70.0 in | Wt 145.0 lb

## 2023-08-22 DIAGNOSIS — Z79899 Other long term (current) drug therapy: Secondary | ICD-10-CM

## 2023-08-22 DIAGNOSIS — E559 Vitamin D deficiency, unspecified: Secondary | ICD-10-CM

## 2023-08-22 DIAGNOSIS — R0989 Other specified symptoms and signs involving the circulatory and respiratory systems: Secondary | ICD-10-CM | POA: Diagnosis not present

## 2023-08-22 DIAGNOSIS — I951 Orthostatic hypotension: Secondary | ICD-10-CM | POA: Diagnosis not present

## 2023-08-22 DIAGNOSIS — R7309 Other abnormal glucose: Secondary | ICD-10-CM | POA: Diagnosis not present

## 2023-08-22 DIAGNOSIS — E782 Mixed hyperlipidemia: Secondary | ICD-10-CM | POA: Diagnosis not present

## 2023-08-22 DIAGNOSIS — F0150 Vascular dementia without behavioral disturbance: Secondary | ICD-10-CM | POA: Diagnosis not present

## 2023-08-23 LAB — CBC WITH DIFFERENTIAL/PLATELET
Absolute Monocytes: 678 {cells}/uL (ref 200–950)
Basophils Absolute: 62 {cells}/uL (ref 0–200)
Basophils Relative: 0.7 %
Eosinophils Absolute: 326 {cells}/uL (ref 15–500)
Eosinophils Relative: 3.7 %
HCT: 42.8 % (ref 38.5–50.0)
Hemoglobin: 14.5 g/dL (ref 13.2–17.1)
Lymphs Abs: 2130 {cells}/uL (ref 850–3900)
MCH: 30.7 pg (ref 27.0–33.0)
MCHC: 33.9 g/dL (ref 32.0–36.0)
MCV: 90.5 fL (ref 80.0–100.0)
MPV: 11 fL (ref 7.5–12.5)
Monocytes Relative: 7.7 %
Neutro Abs: 5606 {cells}/uL (ref 1500–7800)
Neutrophils Relative %: 63.7 %
Platelets: 216 10*3/uL (ref 140–400)
RBC: 4.73 10*6/uL (ref 4.20–5.80)
RDW: 13 % (ref 11.0–15.0)
Total Lymphocyte: 24.2 %
WBC: 8.8 10*3/uL (ref 3.8–10.8)

## 2023-08-23 LAB — COMPLETE METABOLIC PANEL WITH GFR
AG Ratio: 1.5 (calc) (ref 1.0–2.5)
ALT: 26 U/L (ref 9–46)
AST: 25 U/L (ref 10–35)
Albumin: 4 g/dL (ref 3.6–5.1)
Alkaline phosphatase (APISO): 82 U/L (ref 35–144)
BUN: 16 mg/dL (ref 7–25)
CO2: 32 mmol/L (ref 20–32)
Calcium: 9.4 mg/dL (ref 8.6–10.3)
Chloride: 107 mmol/L (ref 98–110)
Creat: 1.16 mg/dL (ref 0.70–1.22)
Globulin: 2.6 g/dL (ref 1.9–3.7)
Glucose, Bld: 107 mg/dL — ABNORMAL HIGH (ref 65–99)
Potassium: 4.2 mmol/L (ref 3.5–5.3)
Sodium: 146 mmol/L (ref 135–146)
Total Bilirubin: 0.6 mg/dL (ref 0.2–1.2)
Total Protein: 6.6 g/dL (ref 6.1–8.1)
eGFR: 62 mL/min/{1.73_m2} (ref >=60)

## 2023-08-23 LAB — VITAMIN D 25 HYDROXY (VIT D DEFICIENCY, FRACTURES): Vit D, 25-Hydroxy: 53 ng/mL (ref 30–100)

## 2023-08-23 LAB — HEMOGLOBIN A1C
Hgb A1c MFr Bld: 5.5 %{Hb} (ref <5.7)
Mean Plasma Glucose: 111 mg/dL
eAG (mmol/L): 6.2 mmol/L

## 2023-08-23 LAB — LIPID PANEL
Cholesterol: 121 mg/dL (ref <200)
HDL: 45 mg/dL (ref >=40)
LDL Cholesterol (Calc): 55 mg/dL
Non-HDL Cholesterol (Calc): 76 mg/dL (ref <130)
Total CHOL/HDL Ratio: 2.7 (calc) (ref <5.0)
Triglycerides: 131 mg/dL (ref <150)

## 2023-08-23 LAB — MAGNESIUM: Magnesium: 2.3 mg/dL (ref 1.5–2.5)

## 2023-08-23 LAB — TSH: TSH: 2.52 m[IU]/L (ref 0.40–4.50)

## 2023-08-23 LAB — INSULIN, RANDOM: Insulin: 64.8 u[IU]/mL — ABNORMAL HIGH

## 2023-08-23 NOTE — Progress Notes (Signed)
^<^<^<^<^<^<^<^<^<^<^<^<^<^<^<^<^<^<^<^<^<^<^<^<^<^<^<^<^<^<^<^<^<^<^<^<^ ^>^>^>^>^>^>^>^>^>^>^>>^>^>^>^>^>^>^>^>^>^>^>^>^>^>^>^>^>^>^>^>^>^>^>^>^>  -  Test results slightly outside the reference range are not unusual. If there is anything important, I will review this with you,  otherwise it is considered normal test values.  If you have further questions,  please do not hesitate to contact me at the office or via My Chart.   ^<^<^<^<^<^<^<^<^<^<^<^<^<^<^<^<^<^<^<^<^<^<^<^<^<^<^<^<^<^<^<^<^<^<^<^<^ ^>^>^>^>^>^>^>^>^>^>^>^>^>^>^>^>^>^>^>^>^>^>^>^>^>^>^>^>^>^>^>^>^>^>^>^>^  - A1c - Normal - No Diabetes  - Great !   ^<^<^<^<^<^<^<^<^<^<^<^<^<^<^<^<^<^<^<^<^<^<^<^<^<^<^<^<^<^<^<^<^<^<^<^<^ ^>^>^>^>^>^>^>^>^>^>^>^>^>^>^>^>^>^>^>^>^>^>^>^>^>^>^>^>^>^>^>^>^>^>^>^>^  - Vitamin D = 53 - is Low   - Vitamin D goal is between 70-100.     - Please INCREASE your Vitamin D to 5,000 units /day !     - It is very important as a natural anti-inflammatory and helping the                           immune system protect against viral infections, like the Covid-19    helping hair, skin, and nails, as well as reducing stroke and heart attack risk.   - It helps your bones and helps with mood.  - It also decreases numerous cancer risks so please                                                                                           take it as directed.   - Low Vit D is associated with a 200-300% higher risk for CANCER   and 200-300% higher risk for HEART   ATTACK  &  STROKE.    - It is also associated with higher death rate at younger ages,   autoimmune diseases like Rheumatoid arthritis, Lupus, Multiple Sclerosis.     - Also many other serious conditions, like depression, Alzheimer's  Dementia,  muscle aches, fatigue, fibromyalgia   ^<^<^<^<^<^<^<^<^<^<^<^<^<^<^<^<^<^<^<^<^<^<^<^<^<^<^<^<^<^<^<^<^<^<^<^<^ ^>^>^>^>^>^>^>^>^>^>^>^>^>^>^>^>^>^>^>^>^>^>^>^>^>^>^>^>^>^>^>^>^>^>^>^>^  -   Chol = 121  - Wonderful  /  Excellent   - Very low risk for Heart Attack  / Stroke ^>^>^>^>^>^>^>^>^>^>^>^>^>^>^>^>^>^>^>^>^>^>^>^>^>^>^>^>^>^>^>^>^>^>^>^>^ ^>^>^>^>^>^>^>^>^>^>^>^>^>^>^>^>^>^>^>^>^>^>^>^>^>^>^>^>^>^>^>^>^>^>^>^>^  -  All Else - CBC - Kidneys - Electrolytes - Liver - Magnesium & Thyroid    - all  Normal / OK  ^<^<^<^<^<^<^<^<^<^<^<^<^<^<^<^<^<^<^<^<^<^<^<^<^<^<^<^<^<^<^<^<^<^<^<^<^ ^>^>^>^>^>^>^>^>^>^>^>^>^>^>^>^>^>^>^>^>^>^>^>^>^>^>^>^>^>^>^>^>^>^>^>^>^

## 2023-09-12 DIAGNOSIS — Z23 Encounter for immunization: Secondary | ICD-10-CM | POA: Diagnosis not present

## 2023-11-14 ENCOUNTER — Ambulatory Visit: Payer: Medicare Other | Admitting: Nurse Practitioner

## 2023-11-30 NOTE — Progress Notes (Unsigned)
FOLLOW UP  Assessment:   Diagnoses and all orders for this visit:   Labile hypertension/hypotension Has been using Florinef 0.1 mg BID for low BP but has been getting some readings in 130/70's. Advised to hold Florinef if BP is not less than 90/60 Monitor blood pressure at home; call if consistently over 130/80 Continue DASH diet.   Reminder to go to the ER if any CP, SOB, nausea, dizziness, severe HA, changes vision/speech, left arm numbness and tingling and jaw pain. - CBC  Gastroesophageal reflux disease, esophagitis presence not specified Currently well controlled with lifestyle changes - avoiding triggers, excessively large meals, lying down after eating OTC agents for rare symptoms; contact office if frequency increases  History of prostate cancer S/p prostatectomy 1999; no issues or suspected recurrence.  Wears pad for incontinence and doing well Continue to monitor.   Mixed hyperlipidemia Continue medications and supplements for LDL goal <100 Continue low cholesterol diet and exercise.  - lipid panel.  - CMP  Abnormal glucose Previously prediabetic currently well controlled with lifestyle changes Encouraged to continue with lifestyle -CMP  Vitamin D deficiency Continue supplementation and routine monitoring At goal at recent check; continue to recommend supplementation for goal of 60-100 Defer vitamin D level   Body mass index (BMI) of 22.0-22.9 in adult Rec. Maintain weight with balanced diet and exercise   Mixed Alzheimer's and Vascular dementia(HCC) Continue to follow with gerontology Continue Namenda 10 mg BID and Aricept 5 mg QD Continue ASA, statin    CKD stage 2 Increase fluids, avoid NSAIDS, monitor sugars, will monitor - CBC - CMP  Hard of hearing Continue hearing aids  Medication Management  -TSH  Will call with lab results: patient / wife  Over 30 minutes of face to face interview, exam, counseling, chart review, and critical decision  making was performed  Future Appointments  Date Time Provider Department Center  03/06/2024  3:00 PM Lucky Cowboy, MD [facility] None  06/07/2024  2:30 PM Raynelle Dick, NP [facility] None       Subjective:  Brian Proctor is a 87 y.o. male who presents for Medicare Annual Wellness Visit and 3 month follow up for HTN, HLD, history prediabetes, and vitamin D Def.   S/p prostatectomy for CA in 1999 - has incontinence for which he wears a pad.   He has hx of basal cell skin CA for which he sees dermatology q64m for monitoring.   He had right lower leg DVT 05/05/21. Currently on ASA 81 mg  GERD well controlled by lifestyle modification; uses alka-seltzer for rare symptoms. He does endorse increased belching but denies other symptoms. He feels well controlled with current regimen.   He has reported ongoing gradual short term memory loss, had MRI in 06/2019 for these reports which showed microvascular changes. Currently on Aricept 10 mg and Namenda 10 mg BID follows with gerontologist for mixed Alzheimers and vascular dementia- continued disease progression has been noted and been recommend to no longer drive. Last visit was 05/27/23- go back to see Dr. Roderic Scarce tomorrow.   BMI is Body mass index is 21.81 kg/m., he has been working on diet and exercise. His various medical conditions are very well controlled - most managed by lifestyle modification. He continues to walk 60 min 3-4 times a week without issues, golfs and does regular yard work.  Wt Readings from Last 3 Encounters:  12/01/23 152 lb (68.9 kg)  08/22/23 145 lb (65.8 kg)  05/04/23 146 lb 12.8 oz (66.6  kg)   His blood pressure has been elevated at home ,he does take florinef once- twice a day usually for low  BP today their BP is BP: 120/64  BP Readings from Last 3 Encounters:  12/01/23 120/64  08/22/23 138/86  05/04/23 128/82  He does workout. He denies chest pain, shortness of breath, dizziness.   He is on cholesterol  medication (atorvastatatin 40 mg daily), on omega 3 supplement and lifestyle changes. His cholesterol is at goal. The cholesterol last visit was:  Lab Results  Component Value Date   CHOL 121 08/22/2023   HDL 45 08/22/2023   LDLCALC 55 08/22/2023   TRIG 131 08/22/2023   CHOLHDL 2.7 08/22/2023   He has been working on diet and exercise for history of prediabetes, and denies increased appetite, nausea, paresthesia of the feet, polydipsia, polyuria and visual disturbances. Last A1C in the office was:  Lab Results  Component Value Date   HGBA1C 5.5 08/22/2023   Last GFR Lab Results  Component Value Date   EGFR 62 08/22/2023     Patient is on Vitamin D supplement.   Lab Results  Component Value Date   VD25OH 53 08/22/2023      Medication Review: Current Outpatient Medications on File Prior to Visit  Medication Sig Dispense Refill   aspirin 81 MG tablet Take 81 mg by mouth daily.     atorvastatin (LIPITOR) 40 MG tablet TAKE 1 TABLET BY MOUTH EVERY OTHER DAY FOR CHOLESTEROL 90 tablet 0   cholecalciferol (VITAMIN D3) 25 MCG (1000 UNIT) tablet Take 1,000 Units by mouth in the morning and at bedtime.     CINNAMON PO Take 2,000 mg by mouth daily.     Coenzyme Q10 200 MG TABS Take 200 mg by mouth daily.     donepezil (ARICEPT) 10 MG tablet Take 10 mg by mouth at bedtime.     fish oil-omega-3 fatty acids 1000 MG capsule Take 1,200 mg by mouth daily.     fludrocortisone (FLORINEF) 0.1 MG tablet Take  1 tablet   2 to 3 x / day  to raise BP                                                                          /                                                                   TAKE                                         BY                                                 MOUTH 270 tablet 3   hydrocortisone-pramoxine (  ANALPRAM-HC) 2.5-1 % rectal cream Place rectally.     Magnesium 250 MG TABS Take 500 mg by mouth daily.     memantine (NAMENDA) 5 MG tablet Take by mouth.     Multiple  Vitamins-Minerals (MULTIVITAMIN PO) Take by mouth daily.     triamcinolone ointment (KENALOG) 0.1 % Apply 1 application topically 2 (two) times daily. Apply to Rash 2 to 3 x /day 80 g 3   vitamin C (ASCORBIC ACID) 500 MG tablet Take 1,000 mg by mouth daily.     zinc gluconate 50 MG tablet Take 50 mg by mouth daily.     No current facility-administered medications on file prior to visit.    Allergies: No Known Allergies  Current Problems (verified) has Hyperlipidemia, mixed; Essential hypertension; GERD (gastroesophageal reflux disease); Vitamin D deficiency; Medication management; History of prostate cancer; BMI 22.0-22.9, adult; History of basal cell carcinoma; Former smoker; FH: heart disease; COPD (chronic obstructive pulmonary disease) (HCC); Abnormal glucose; and Vascular dementia without behavioral disturbance (HCC) on their problem list.  Screening Tests Immunization History  Administered Date(s) Administered   DT (Pediatric) 09/03/2015   Fluad Quad(high Dose 65+) 10/13/2019   Fluad Trivalent(High Dose 65+) 09/06/2023   Influenza Inj Mdck Quad Pf 11/10/2018   Influenza Split 10/28/2016   Influenza, High Dose Seasonal PF 09/27/2014, 09/03/2015, 10/05/2017, 10/07/2020, 10/21/2021   Influenza,inj,Quad PF,6+ Mos 10/11/2018   PFIZER Comirnaty(Gray Top)Covid-19 Tri-Sucrose Vaccine 05/22/2021   PFIZER(Purple Top)SARS-COV-2 Vaccination 01/17/2020, 02/07/2020, 09/23/2020   Pfizer Covid-19 Vaccine Bivalent Booster 22yrs & up 10/21/2021   Pneumococcal Conjugate-13 10/16/2014   Pneumococcal Polysaccharide-23 03/16/2022   Pneumococcal-Unspecified 02/03/2011   Td 07/26/2005, 08/07/2018   Unspecified SARS-COV-2 Vaccination 09/06/2023   Zoster, Live 12/27/2006   Health Maintenance  Topic Date Due   Zoster Vaccines- Shingrix (1 of 2) 01/07/1955   COVID-19 Vaccine (7 - 2023-24 season) 11/01/2023   Medicare Annual Wellness (AWV)  05/03/2024   DTaP/Tdap/Td (4 - Tdap) 08/07/2028   Pneumonia  Vaccine 20+ Years old  Completed   INFLUENZA VACCINE  Completed   HPV VACCINES  Aged Out    Preventative care: MRI brain 07/2019 microvascular changes  Names of Other Physician/Practitioners you currently use: 1. [facility] here for primary care 2. [facility], eye doctor, last visit 2022?, wears glasses, no issues 3. [facility], dentist, last visit 2022 - goes q6 months  Sees dermatology q 6 months  Sees Gerontology at [facility] q 6 months  Patient Care Team: Lucky Cowboy, MD as PCP - General (Internal Medicine) Charlett Nose, [facility] (Inactive) as Pharmacist (Pharmacist)  Surgical: He  has a past surgical history that includes Tonsillectomy; Prostate surgery (1999 or 2000); Colonoscopy; and Inguinal hernia repair (01/15/2013). Family His family history includes Autism in his son; COPD in his father; Heart disease in his father; Other in his mother. Social history  He reports that he quit smoking about 24 years ago. His smoking use included cigarettes. He started smoking about 64 years ago. He has a 40 pack-year smoking history. He has never used smokeless tobacco. He reports that he does not drink alcohol and does not use drugs.    Objective:   Today's Vitals   12/01/23 1428  BP: 120/64  Pulse: 65  Temp: 97.7 F (36.5 C)  SpO2: 97%  Weight: 152 lb (68.9 kg)  Height: 5\' 10"  (1.778 m)      Body mass index is 21.81 kg/m.  General appearance: alert, no distress, WD/WN, male HEENT:  normocephalic, sclerae anicteric, TMs pearly, nares patent, no discharge or erythema, pharynx normal Oral cavity: MMM, no lesions Neck: supple, no lymphadenopathy, no thyromegaly, no masses Heart: RRR, normal S1, S2, no murmurs Lungs: CTA bilaterally, no wheezes, rhonchi, or rales Abdomen: +bs, soft, non tender, non distended, no masses, no hepatomegaly, no splenomegaly Musculoskeletal: nontender, no swelling, no obvious  deformity Extremities: no edema, no cyanosis, no clubbing Pulses: 2+ symmetric, upper and lower extremities, normal cap refill Neurological: alert, oriented x 3, CN2-12 intact, strength normal upper extremities and lower extremities, sensation normal throughout, DTRs 2+ throughout, no cerebellar signs, gait normal. Poor short term recall.  Psychiatric: normal affect, behavior normal, pleasant . 1/3 word recall today. Skin: warm/dry, intact, no rashes   Raynelle Dick, NP   12/01/2023

## 2023-12-01 ENCOUNTER — Encounter: Payer: Self-pay | Admitting: Nurse Practitioner

## 2023-12-01 ENCOUNTER — Ambulatory Visit (INDEPENDENT_AMBULATORY_CARE_PROVIDER_SITE_OTHER): Payer: Medicare Other | Admitting: Nurse Practitioner

## 2023-12-01 VITALS — BP 120/64 | HR 65 | Temp 97.7°F | Ht 70.0 in | Wt 152.0 lb

## 2023-12-01 DIAGNOSIS — H9190 Unspecified hearing loss, unspecified ear: Secondary | ICD-10-CM | POA: Diagnosis not present

## 2023-12-01 DIAGNOSIS — G309 Alzheimer's disease, unspecified: Secondary | ICD-10-CM

## 2023-12-01 DIAGNOSIS — E559 Vitamin D deficiency, unspecified: Secondary | ICD-10-CM | POA: Diagnosis not present

## 2023-12-01 DIAGNOSIS — E782 Mixed hyperlipidemia: Secondary | ICD-10-CM | POA: Diagnosis not present

## 2023-12-01 DIAGNOSIS — F0150 Vascular dementia without behavioral disturbance: Secondary | ICD-10-CM

## 2023-12-01 DIAGNOSIS — Z6822 Body mass index (BMI) 22.0-22.9, adult: Secondary | ICD-10-CM | POA: Diagnosis not present

## 2023-12-01 DIAGNOSIS — N182 Chronic kidney disease, stage 2 (mild): Secondary | ICD-10-CM

## 2023-12-01 DIAGNOSIS — R0989 Other specified symptoms and signs involving the circulatory and respiratory systems: Secondary | ICD-10-CM | POA: Diagnosis not present

## 2023-12-01 DIAGNOSIS — R7309 Other abnormal glucose: Secondary | ICD-10-CM | POA: Diagnosis not present

## 2023-12-01 DIAGNOSIS — F0280 Dementia in other diseases classified elsewhere without behavioral disturbance: Secondary | ICD-10-CM

## 2023-12-01 DIAGNOSIS — I10 Essential (primary) hypertension: Secondary | ICD-10-CM

## 2023-12-01 DIAGNOSIS — Z79899 Other long term (current) drug therapy: Secondary | ICD-10-CM | POA: Diagnosis not present

## 2023-12-01 NOTE — Patient Instructions (Signed)

## 2023-12-02 DIAGNOSIS — G309 Alzheimer's disease, unspecified: Secondary | ICD-10-CM | POA: Diagnosis not present

## 2023-12-02 DIAGNOSIS — F0150 Vascular dementia without behavioral disturbance: Secondary | ICD-10-CM | POA: Diagnosis not present

## 2023-12-02 DIAGNOSIS — F0280 Dementia in other diseases classified elsewhere without behavioral disturbance: Secondary | ICD-10-CM | POA: Diagnosis not present

## 2023-12-02 LAB — CBC WITH DIFFERENTIAL/PLATELET
Absolute Lymphocytes: 1944 {cells}/uL (ref 850–3900)
Absolute Monocytes: 679 {cells}/uL (ref 200–950)
Basophils Absolute: 74 {cells}/uL (ref 0–200)
Basophils Relative: 0.8 %
Eosinophils Absolute: 335 {cells}/uL (ref 15–500)
Eosinophils Relative: 3.6 %
HCT: 41.3 % (ref 38.5–50.0)
Hemoglobin: 13.7 g/dL (ref 13.2–17.1)
MCH: 30.6 pg (ref 27.0–33.0)
MCHC: 33.2 g/dL (ref 32.0–36.0)
MCV: 92.4 fL (ref 80.0–100.0)
MPV: 11.4 fL (ref 7.5–12.5)
Monocytes Relative: 7.3 %
Neutro Abs: 6268 {cells}/uL (ref 1500–7800)
Neutrophils Relative %: 67.4 %
Platelets: 218 10*3/uL (ref 140–400)
RBC: 4.47 10*6/uL (ref 4.20–5.80)
RDW: 12.6 % (ref 11.0–15.0)
Total Lymphocyte: 20.9 %
WBC: 9.3 10*3/uL (ref 3.8–10.8)

## 2023-12-02 LAB — COMPLETE METABOLIC PANEL WITH GFR
AG Ratio: 1.4 (calc) (ref 1.0–2.5)
ALT: 22 U/L (ref 9–46)
AST: 23 U/L (ref 10–35)
Albumin: 3.9 g/dL (ref 3.6–5.1)
Alkaline phosphatase (APISO): 82 U/L (ref 35–144)
BUN/Creatinine Ratio: 18 (calc) (ref 6–22)
BUN: 24 mg/dL (ref 7–25)
CO2: 31 mmol/L (ref 20–32)
Calcium: 8.8 mg/dL (ref 8.6–10.3)
Chloride: 107 mmol/L (ref 98–110)
Creat: 1.3 mg/dL — ABNORMAL HIGH (ref 0.70–1.22)
Globulin: 2.8 g/dL (ref 1.9–3.7)
Glucose, Bld: 82 mg/dL (ref 65–139)
Potassium: 3.8 mmol/L (ref 3.5–5.3)
Sodium: 146 mmol/L (ref 135–146)
Total Bilirubin: 0.5 mg/dL (ref 0.2–1.2)
Total Protein: 6.7 g/dL (ref 6.1–8.1)
eGFR: 54 mL/min/{1.73_m2} — ABNORMAL LOW (ref >=60)

## 2023-12-02 LAB — TSH: TSH: 2.47 m[IU]/L (ref 0.40–4.50)

## 2023-12-02 LAB — LIPID PANEL
Cholesterol: 126 mg/dL (ref <200)
HDL: 49 mg/dL (ref >=40)
LDL Cholesterol (Calc): 56 mg/dL
Non-HDL Cholesterol (Calc): 77 mg/dL (ref <130)
Total CHOL/HDL Ratio: 2.6 (calc) (ref <5.0)
Triglycerides: 128 mg/dL (ref <150)

## 2023-12-15 DIAGNOSIS — F0150 Vascular dementia without behavioral disturbance: Secondary | ICD-10-CM | POA: Diagnosis not present

## 2023-12-15 DIAGNOSIS — F0280 Dementia in other diseases classified elsewhere without behavioral disturbance: Secondary | ICD-10-CM | POA: Diagnosis not present

## 2023-12-15 DIAGNOSIS — G309 Alzheimer's disease, unspecified: Secondary | ICD-10-CM | POA: Diagnosis not present

## 2024-01-31 ENCOUNTER — Encounter: Payer: Medicare Other | Admitting: Internal Medicine

## 2024-02-15 ENCOUNTER — Encounter: Payer: Medicare Other | Admitting: Internal Medicine

## 2024-02-23 DIAGNOSIS — D225 Melanocytic nevi of trunk: Secondary | ICD-10-CM | POA: Diagnosis not present

## 2024-02-23 DIAGNOSIS — L814 Other melanin hyperpigmentation: Secondary | ICD-10-CM | POA: Diagnosis not present

## 2024-02-23 DIAGNOSIS — L578 Other skin changes due to chronic exposure to nonionizing radiation: Secondary | ICD-10-CM | POA: Diagnosis not present

## 2024-02-23 DIAGNOSIS — L0101 Non-bullous impetigo: Secondary | ICD-10-CM | POA: Diagnosis not present

## 2024-02-23 DIAGNOSIS — Z08 Encounter for follow-up examination after completed treatment for malignant neoplasm: Secondary | ICD-10-CM | POA: Diagnosis not present

## 2024-02-23 DIAGNOSIS — L570 Actinic keratosis: Secondary | ICD-10-CM | POA: Diagnosis not present

## 2024-02-23 DIAGNOSIS — Z85828 Personal history of other malignant neoplasm of skin: Secondary | ICD-10-CM | POA: Diagnosis not present

## 2024-02-23 DIAGNOSIS — L821 Other seborrheic keratosis: Secondary | ICD-10-CM | POA: Diagnosis not present

## 2024-03-06 ENCOUNTER — Encounter: Payer: Medicare Other | Admitting: Internal Medicine

## 2024-03-19 DIAGNOSIS — L570 Actinic keratosis: Secondary | ICD-10-CM | POA: Diagnosis not present

## 2024-03-21 ENCOUNTER — Ambulatory Visit (HOSPITAL_BASED_OUTPATIENT_CLINIC_OR_DEPARTMENT_OTHER): Payer: Medicare Other | Admitting: Family Medicine

## 2024-04-27 ENCOUNTER — Other Ambulatory Visit: Payer: Self-pay | Admitting: Internal Medicine

## 2024-04-27 DIAGNOSIS — I951 Orthostatic hypotension: Secondary | ICD-10-CM

## 2024-04-27 NOTE — Telephone Encounter (Signed)
 Copied from CRM [id number]. Topic: Clinical - Medication Refill >> Apr 27, 2024 12:43 PM Phil Braun wrote: Most Recent Primary Care Visit:   Medication: fludrocortisone  (FLORINEF ) 0.1 MG tablet  Has the patient contacted their pharmacy? Yes   Is this the correct pharmacy for this prescription? Yes If no, delete pharmacy and type the correct one.  This is the patient's preferred pharmacy:   [id number] [place], [place] - [address] AT [facility] [address] [facility] [address] [place] [address] Phone: [phone] Fax: [phone]   Has the prescription been filled recently? Yes  Is the patient out of the medication? Yes  Has the patient been seen for an appointment in the last year OR does the patient have an upcoming appointment? Yes  Can we respond through MyChart? No  Agent: Please be advised that Rx refills may take up to 3 business days. We ask that you follow-up with your pharmacy.

## 2024-04-30 MED ORDER — FLUDROCORTISONE ACETATE 0.1 MG PO TABS
ORAL_TABLET | ORAL | 3 refills | Status: AC
Start: 2024-04-30 — End: ?

## 2024-05-15 ENCOUNTER — Ambulatory Visit (HOSPITAL_BASED_OUTPATIENT_CLINIC_OR_DEPARTMENT_OTHER): Admitting: Family Medicine

## 2024-05-18 ENCOUNTER — Ambulatory Visit (HOSPITAL_BASED_OUTPATIENT_CLINIC_OR_DEPARTMENT_OTHER): Admitting: Family Medicine

## 2024-05-30 DIAGNOSIS — Z0000 Encounter for general adult medical examination without abnormal findings: Secondary | ICD-10-CM | POA: Diagnosis not present

## 2024-05-30 DIAGNOSIS — Z8546 Personal history of malignant neoplasm of prostate: Secondary | ICD-10-CM | POA: Diagnosis not present

## 2024-05-30 DIAGNOSIS — R4189 Other symptoms and signs involving cognitive functions and awareness: Secondary | ICD-10-CM | POA: Diagnosis not present

## 2024-05-30 DIAGNOSIS — H9190 Unspecified hearing loss, unspecified ear: Secondary | ICD-10-CM | POA: Diagnosis not present

## 2024-05-30 DIAGNOSIS — E782 Mixed hyperlipidemia: Secondary | ICD-10-CM | POA: Diagnosis not present

## 2024-06-04 DIAGNOSIS — F0150 Vascular dementia without behavioral disturbance: Secondary | ICD-10-CM | POA: Diagnosis not present

## 2024-06-04 DIAGNOSIS — F0280 Dementia in other diseases classified elsewhere without behavioral disturbance: Secondary | ICD-10-CM | POA: Diagnosis not present

## 2024-06-04 DIAGNOSIS — G309 Alzheimer's disease, unspecified: Secondary | ICD-10-CM | POA: Diagnosis not present

## 2024-06-07 ENCOUNTER — Ambulatory Visit: Payer: Medicare Other | Admitting: Nurse Practitioner

## 2024-08-06 ENCOUNTER — Ambulatory Visit (HOSPITAL_BASED_OUTPATIENT_CLINIC_OR_DEPARTMENT_OTHER): Admitting: Family Medicine

## 2024-08-22 DIAGNOSIS — L570 Actinic keratosis: Secondary | ICD-10-CM | POA: Diagnosis not present

## 2024-08-22 DIAGNOSIS — Z85828 Personal history of other malignant neoplasm of skin: Secondary | ICD-10-CM | POA: Diagnosis not present

## 2024-08-22 DIAGNOSIS — Z08 Encounter for follow-up examination after completed treatment for malignant neoplasm: Secondary | ICD-10-CM | POA: Diagnosis not present

## 2024-08-22 DIAGNOSIS — D225 Melanocytic nevi of trunk: Secondary | ICD-10-CM | POA: Diagnosis not present

## 2024-08-22 DIAGNOSIS — L821 Other seborrheic keratosis: Secondary | ICD-10-CM | POA: Diagnosis not present

## 2024-08-22 DIAGNOSIS — L814 Other melanin hyperpigmentation: Secondary | ICD-10-CM | POA: Diagnosis not present

## 2024-09-21 DIAGNOSIS — Z23 Encounter for immunization: Secondary | ICD-10-CM | POA: Diagnosis not present

## 2024-11-29 DIAGNOSIS — Z79899 Other long term (current) drug therapy: Secondary | ICD-10-CM | POA: Diagnosis not present

## 2024-11-29 DIAGNOSIS — Z8546 Personal history of malignant neoplasm of prostate: Secondary | ICD-10-CM | POA: Diagnosis not present

## 2024-11-29 DIAGNOSIS — R32 Unspecified urinary incontinence: Secondary | ICD-10-CM | POA: Diagnosis not present

## 2024-11-29 DIAGNOSIS — E782 Mixed hyperlipidemia: Secondary | ICD-10-CM | POA: Diagnosis not present

## 2024-11-29 DIAGNOSIS — H9190 Unspecified hearing loss, unspecified ear: Secondary | ICD-10-CM | POA: Diagnosis not present

## 2024-11-29 DIAGNOSIS — F0390 Unspecified dementia without behavioral disturbance: Secondary | ICD-10-CM | POA: Diagnosis not present
